# Patient Record
Sex: Female | Born: 1940 | Race: White | Hispanic: No | Marital: Married | Smoking: Never smoker
Health system: Southern US, Community
[De-identification: ages and names within clinical notes are randomized; demographics above are authoritative.]

## PROBLEM LIST (undated history)

## (undated) DIAGNOSIS — I8390 Asymptomatic varicose veins of unspecified lower extremity: Secondary | ICD-10-CM

## (undated) DIAGNOSIS — G709 Myoneural disorder, unspecified: Secondary | ICD-10-CM

## (undated) DIAGNOSIS — C50119 Malignant neoplasm of central portion of unspecified female breast: Secondary | ICD-10-CM

## (undated) DIAGNOSIS — M549 Dorsalgia, unspecified: Secondary | ICD-10-CM

## (undated) DIAGNOSIS — R259 Unspecified abnormal involuntary movements: Secondary | ICD-10-CM

## (undated) DIAGNOSIS — R42 Dizziness and giddiness: Secondary | ICD-10-CM

## (undated) DIAGNOSIS — C50919 Malignant neoplasm of unspecified site of unspecified female breast: Secondary | ICD-10-CM

## (undated) DIAGNOSIS — H814 Vertigo of central origin: Secondary | ICD-10-CM

## (undated) DIAGNOSIS — R030 Elevated blood-pressure reading, without diagnosis of hypertension: Secondary | ICD-10-CM

## (undated) DIAGNOSIS — R5383 Other fatigue: Secondary | ICD-10-CM

## (undated) DIAGNOSIS — F411 Generalized anxiety disorder: Secondary | ICD-10-CM

## (undated) DIAGNOSIS — D509 Iron deficiency anemia, unspecified: Secondary | ICD-10-CM

## (undated) DIAGNOSIS — B029 Zoster without complications: Secondary | ICD-10-CM

## (undated) DIAGNOSIS — Z9289 Personal history of other medical treatment: Secondary | ICD-10-CM

## (undated) DIAGNOSIS — J309 Allergic rhinitis, unspecified: Secondary | ICD-10-CM

## (undated) DIAGNOSIS — R5381 Other malaise: Secondary | ICD-10-CM

## (undated) DIAGNOSIS — K219 Gastro-esophageal reflux disease without esophagitis: Secondary | ICD-10-CM

## (undated) DIAGNOSIS — M79609 Pain in unspecified limb: Secondary | ICD-10-CM

## (undated) DIAGNOSIS — T7840XA Allergy, unspecified, initial encounter: Secondary | ICD-10-CM

## (undated) HISTORY — PX: TONSILLECTOMY: SUR1361

## (undated) HISTORY — PX: ECTOPIC PREGNANCY SURGERY: SHX613

## (undated) HISTORY — DX: Malignant neoplasm of central portion of unspecified female breast: C50.119

## (undated) HISTORY — DX: Dizziness and giddiness: R42

## (undated) HISTORY — DX: Other fatigue: R53.83

## (undated) HISTORY — DX: Asymptomatic varicose veins of unspecified lower extremity: I83.90

## (undated) HISTORY — DX: Malignant neoplasm of unspecified site of unspecified female breast: C50.919

## (undated) HISTORY — DX: Unspecified abnormal involuntary movements: R25.9

## (undated) HISTORY — DX: Myoneural disorder, unspecified: G70.9

## (undated) HISTORY — DX: Gastro-esophageal reflux disease without esophagitis: K21.9

## (undated) HISTORY — DX: Dorsalgia, unspecified: M54.9

## (undated) HISTORY — DX: Pain in unspecified limb: M79.609

## (undated) HISTORY — DX: Elevated blood-pressure reading, without diagnosis of hypertension: R03.0

## (undated) HISTORY — DX: Zoster without complications: B02.9

## (undated) HISTORY — DX: Allergic rhinitis, unspecified: J30.9

## (undated) HISTORY — DX: Other malaise: R53.81

## (undated) HISTORY — DX: Generalized anxiety disorder: F41.1

## (undated) HISTORY — DX: Vertigo of central origin: H81.4

## (undated) HISTORY — DX: Iron deficiency anemia, unspecified: D50.9

## (undated) HISTORY — PX: FOOT SURGERY: SHX648

## (undated) HISTORY — DX: Allergy, unspecified, initial encounter: T78.40XA

---

## 2007-04-21 ENCOUNTER — Emergency Department (HOSPITAL_COMMUNITY): Admission: EM | Admit: 2007-04-21 | Discharge: 2007-04-21 | Payer: Self-pay | Admitting: Emergency Medicine

## 2007-11-01 LAB — CONVERTED CEMR LAB: Pap Smear: NORMAL

## 2007-12-30 HISTORY — PX: OTHER SURGICAL HISTORY: SHX169

## 2008-01-24 ENCOUNTER — Ambulatory Visit: Payer: Self-pay | Admitting: Internal Medicine

## 2008-01-24 DIAGNOSIS — D509 Iron deficiency anemia, unspecified: Secondary | ICD-10-CM | POA: Insufficient documentation

## 2008-01-24 DIAGNOSIS — R259 Unspecified abnormal involuntary movements: Secondary | ICD-10-CM

## 2008-01-24 DIAGNOSIS — J309 Allergic rhinitis, unspecified: Secondary | ICD-10-CM | POA: Insufficient documentation

## 2008-01-24 DIAGNOSIS — K219 Gastro-esophageal reflux disease without esophagitis: Secondary | ICD-10-CM

## 2008-01-24 HISTORY — DX: Allergic rhinitis, unspecified: J30.9

## 2008-01-24 HISTORY — DX: Unspecified abnormal involuntary movements: R25.9

## 2008-01-24 HISTORY — DX: Gastro-esophageal reflux disease without esophagitis: K21.9

## 2008-01-24 HISTORY — DX: Iron deficiency anemia, unspecified: D50.9

## 2008-06-08 ENCOUNTER — Ambulatory Visit: Payer: Self-pay | Admitting: Internal Medicine

## 2008-06-08 LAB — CONVERTED CEMR LAB
ALT: 25 units/L (ref 0–35)
AST: 23 units/L (ref 0–37)
Albumin: 4.2 g/dL (ref 3.5–5.2)
BUN: 9 mg/dL (ref 6–23)
Basophils Absolute: 0 10*3/uL (ref 0.0–0.1)
Basophils Relative: 0.6 % (ref 0.0–1.0)
CO2: 30 meq/L (ref 19–32)
Calcium: 8.8 mg/dL (ref 8.4–10.5)
Chloride: 105 meq/L (ref 96–112)
Cholesterol: 195 mg/dL (ref 0–200)
Creatinine, Ser: 0.8 mg/dL (ref 0.4–1.2)
Eosinophils Absolute: 0.1 10*3/uL (ref 0.0–0.7)
Eosinophils Relative: 2.3 % (ref 0.0–5.0)
Hemoglobin: 13.6 g/dL (ref 12.0–15.0)
Ketones, ur: NEGATIVE mg/dL
LDL Cholesterol: 120 mg/dL — ABNORMAL HIGH (ref 0–99)
Lymphocytes Relative: 25.5 % (ref 12.0–46.0)
MCHC: 35 g/dL (ref 30.0–36.0)
MCV: 93.9 fL (ref 78.0–100.0)
Mucus, UA: NEGATIVE
Neutro Abs: 3.6 10*3/uL (ref 1.4–7.7)
Neutrophils Relative %: 60.8 % (ref 43.0–77.0)
RBC: 4.14 M/uL (ref 3.87–5.11)
Specific Gravity, Urine: 1.01 (ref 1.000–1.03)
TSH: 1.59 microintl units/mL (ref 0.35–5.50)
Total Protein, Urine: NEGATIVE mg/dL
Total Protein: 7 g/dL (ref 6.0–8.3)
Triglycerides: 65 mg/dL (ref 0–149)
Urine Glucose: NEGATIVE mg/dL
Urobilinogen, UA: 0.2 (ref 0.0–1.0)
WBC: 5.8 10*3/uL (ref 4.5–10.5)
pH: 7.5 (ref 5.0–8.0)

## 2008-06-22 ENCOUNTER — Encounter: Payer: Self-pay | Admitting: Internal Medicine

## 2008-08-16 ENCOUNTER — Ambulatory Visit: Payer: Self-pay | Admitting: Internal Medicine

## 2008-08-16 DIAGNOSIS — I8390 Asymptomatic varicose veins of unspecified lower extremity: Secondary | ICD-10-CM | POA: Insufficient documentation

## 2008-08-16 DIAGNOSIS — E785 Hyperlipidemia, unspecified: Secondary | ICD-10-CM | POA: Insufficient documentation

## 2008-08-16 HISTORY — DX: Asymptomatic varicose veins of unspecified lower extremity: I83.90

## 2008-08-17 ENCOUNTER — Telehealth (INDEPENDENT_AMBULATORY_CARE_PROVIDER_SITE_OTHER): Payer: Self-pay | Admitting: *Deleted

## 2008-08-28 ENCOUNTER — Ambulatory Visit: Payer: Self-pay | Admitting: Gastroenterology

## 2008-08-29 ENCOUNTER — Encounter: Payer: Self-pay | Admitting: Internal Medicine

## 2008-08-29 ENCOUNTER — Ambulatory Visit: Payer: Self-pay | Admitting: Family Medicine

## 2008-09-11 ENCOUNTER — Telehealth: Payer: Self-pay | Admitting: Internal Medicine

## 2008-09-12 ENCOUNTER — Telehealth: Payer: Self-pay | Admitting: Gastroenterology

## 2008-09-13 ENCOUNTER — Encounter: Payer: Self-pay | Admitting: Gastroenterology

## 2008-09-13 ENCOUNTER — Ambulatory Visit: Payer: Self-pay | Admitting: Gastroenterology

## 2008-09-15 ENCOUNTER — Encounter: Payer: Self-pay | Admitting: Gastroenterology

## 2008-12-29 HISTORY — PX: BACK SURGERY: SHX140

## 2009-01-22 ENCOUNTER — Telehealth (INDEPENDENT_AMBULATORY_CARE_PROVIDER_SITE_OTHER): Payer: Self-pay | Admitting: *Deleted

## 2009-02-09 ENCOUNTER — Telehealth: Payer: Self-pay | Admitting: Internal Medicine

## 2009-02-26 ENCOUNTER — Ambulatory Visit: Payer: Self-pay | Admitting: Internal Medicine

## 2009-02-26 DIAGNOSIS — R030 Elevated blood-pressure reading, without diagnosis of hypertension: Secondary | ICD-10-CM

## 2009-02-26 DIAGNOSIS — R5383 Other fatigue: Secondary | ICD-10-CM | POA: Insufficient documentation

## 2009-02-26 DIAGNOSIS — H814 Vertigo of central origin: Secondary | ICD-10-CM

## 2009-02-26 DIAGNOSIS — R5381 Other malaise: Secondary | ICD-10-CM

## 2009-02-26 DIAGNOSIS — F411 Generalized anxiety disorder: Secondary | ICD-10-CM

## 2009-02-26 HISTORY — DX: Other fatigue: R53.83

## 2009-02-26 HISTORY — DX: Vertigo of central origin: H81.4

## 2009-02-26 HISTORY — DX: Other malaise: R53.81

## 2009-02-26 HISTORY — DX: Elevated blood-pressure reading, without diagnosis of hypertension: R03.0

## 2009-02-26 HISTORY — DX: Generalized anxiety disorder: F41.1

## 2009-02-27 LAB — CONVERTED CEMR LAB
AST: 18 units/L (ref 0–37)
Albumin: 3.9 g/dL (ref 3.5–5.2)
BUN: 12 mg/dL (ref 6–23)
Basophils Relative: 1.1 % (ref 0.0–3.0)
Calcium: 9.2 mg/dL (ref 8.4–10.5)
Chloride: 105 meq/L (ref 96–112)
Creatinine, Ser: 0.9 mg/dL (ref 0.4–1.2)
Direct LDL: 107.4 mg/dL
Eosinophils Absolute: 0.1 10*3/uL (ref 0.0–0.7)
Eosinophils Relative: 2.2 % (ref 0.0–5.0)
GFR calc non Af Amer: 67 mL/min
HCT: 40.5 % (ref 36.0–46.0)
Hemoglobin: 13.6 g/dL (ref 12.0–15.0)
MCV: 95.1 fL (ref 78.0–100.0)
Monocytes Absolute: 0.6 10*3/uL (ref 0.1–1.0)
Neutro Abs: 2.9 10*3/uL (ref 1.4–7.7)
Neutrophils Relative %: 53.6 % (ref 43.0–77.0)
RBC: 4.26 M/uL (ref 3.87–5.11)
WBC: 5.5 10*3/uL (ref 4.5–10.5)

## 2009-06-08 ENCOUNTER — Encounter: Admission: RE | Admit: 2009-06-08 | Discharge: 2009-06-08 | Payer: Self-pay | Admitting: Orthopedic Surgery

## 2009-06-11 ENCOUNTER — Encounter: Admission: RE | Admit: 2009-06-11 | Discharge: 2009-06-11 | Payer: Self-pay | Admitting: Orthopedic Surgery

## 2009-06-22 ENCOUNTER — Ambulatory Visit: Payer: Self-pay | Admitting: Internal Medicine

## 2009-06-28 HISTORY — PX: OTHER SURGICAL HISTORY: SHX169

## 2009-07-18 ENCOUNTER — Inpatient Hospital Stay (HOSPITAL_COMMUNITY): Admission: RE | Admit: 2009-07-18 | Discharge: 2009-07-20 | Payer: Self-pay | Admitting: Orthopedic Surgery

## 2009-09-25 ENCOUNTER — Ambulatory Visit: Payer: Self-pay | Admitting: Internal Medicine

## 2010-01-21 ENCOUNTER — Encounter: Payer: Self-pay | Admitting: Internal Medicine

## 2010-02-28 ENCOUNTER — Ambulatory Visit: Payer: Self-pay | Admitting: Internal Medicine

## 2010-02-28 DIAGNOSIS — R42 Dizziness and giddiness: Secondary | ICD-10-CM

## 2010-02-28 HISTORY — DX: Dizziness and giddiness: R42

## 2010-04-02 ENCOUNTER — Ambulatory Visit: Payer: Self-pay | Admitting: Vascular Surgery

## 2010-04-10 ENCOUNTER — Telehealth: Payer: Self-pay | Admitting: Internal Medicine

## 2010-07-15 ENCOUNTER — Telehealth: Payer: Self-pay | Admitting: Internal Medicine

## 2010-09-23 ENCOUNTER — Encounter: Payer: Self-pay | Admitting: Internal Medicine

## 2010-09-30 ENCOUNTER — Ambulatory Visit: Payer: Self-pay | Admitting: Internal Medicine

## 2010-09-30 ENCOUNTER — Encounter: Payer: Self-pay | Admitting: Internal Medicine

## 2010-09-30 DIAGNOSIS — M79609 Pain in unspecified limb: Secondary | ICD-10-CM | POA: Insufficient documentation

## 2010-09-30 DIAGNOSIS — M549 Dorsalgia, unspecified: Secondary | ICD-10-CM | POA: Insufficient documentation

## 2010-09-30 HISTORY — DX: Pain in unspecified limb: M79.609

## 2010-09-30 HISTORY — DX: Dorsalgia, unspecified: M54.9

## 2010-10-01 LAB — CONVERTED CEMR LAB
AST: 23 units/L (ref 0–37)
Alkaline Phosphatase: 63 units/L (ref 39–117)
BUN: 17 mg/dL (ref 6–23)
Basophils Absolute: 0 10*3/uL (ref 0.0–0.1)
Calcium: 9.6 mg/dL (ref 8.4–10.5)
Direct LDL: 152.2 mg/dL
Folate: 9.7 ng/mL
GFR calc non Af Amer: 66.31 mL/min (ref >60)
Glucose, Bld: 102 mg/dL — ABNORMAL HIGH (ref 70–99)
HDL: 83.7 mg/dL (ref >39.00)
Iron: 106 ug/dL (ref 42–145)
Ketones, ur: NEGATIVE mg/dL
Lymphocytes Relative: 30.5 % (ref 12.0–46.0)
Monocytes Relative: 11.9 % (ref 3.0–12.0)
Platelets: 300 10*3/uL (ref 150.0–400.0)
RDW: 12.9 % (ref 11.5–14.6)
Sodium: 144 meq/L (ref 135–145)
Specific Gravity, Urine: 1.015 (ref 1.000–1.030)
Total Bilirubin: 0.6 mg/dL (ref 0.3–1.2)
Total CHOL/HDL Ratio: 3
Triglycerides: 62 mg/dL (ref 0.0–149.0)
Urine Glucose: NEGATIVE mg/dL
pH: 6.5 (ref 5.0–8.0)

## 2010-10-10 ENCOUNTER — Ambulatory Visit: Payer: Self-pay | Admitting: Internal Medicine

## 2010-10-10 LAB — CONVERTED CEMR LAB
Direct LDL: 118.3 mg/dL
Triglycerides: 59 mg/dL (ref 0.0–149.0)

## 2010-10-16 ENCOUNTER — Encounter (INDEPENDENT_AMBULATORY_CARE_PROVIDER_SITE_OTHER): Payer: Self-pay | Admitting: *Deleted

## 2010-10-24 ENCOUNTER — Encounter: Payer: Self-pay | Admitting: Internal Medicine

## 2010-10-29 ENCOUNTER — Telehealth (INDEPENDENT_AMBULATORY_CARE_PROVIDER_SITE_OTHER): Payer: Self-pay | Admitting: *Deleted

## 2010-11-05 ENCOUNTER — Telehealth: Payer: Self-pay | Admitting: Internal Medicine

## 2010-11-06 ENCOUNTER — Ambulatory Visit: Payer: Self-pay | Admitting: Internal Medicine

## 2010-11-06 DIAGNOSIS — B029 Zoster without complications: Secondary | ICD-10-CM

## 2010-11-06 HISTORY — DX: Zoster without complications: B02.9

## 2010-11-12 ENCOUNTER — Ambulatory Visit: Payer: Self-pay | Admitting: Internal Medicine

## 2010-11-12 ENCOUNTER — Encounter: Payer: Self-pay | Admitting: Internal Medicine

## 2010-12-10 ENCOUNTER — Encounter: Payer: Self-pay | Admitting: Internal Medicine

## 2010-12-11 ENCOUNTER — Encounter: Payer: Self-pay | Admitting: Internal Medicine

## 2010-12-26 ENCOUNTER — Encounter: Payer: Self-pay | Admitting: Internal Medicine

## 2011-01-28 NOTE — Progress Notes (Signed)
Summary: Med Refill  Phone Note From Pharmacy   [name] Summary of Call: Fax from pharmacy stating patient is requesting the remainder #60 tabs(Clonazepam 1mg ) in one dispensing due to vacation. Initial call taken by: Robin Ewing CMA Duncan Dull),  July 15, 2010 9:01 AM  Follow-up for Phone Call        ok this time Follow-up by: Corwin Levins MD,  July 15, 2010 12:27 PM  Additional Follow-up for Phone Call Additional follow up Details #1::        Left message on voicemail notifying pharmacy that per MD it is ok this time only. Additional Follow-up by: Lucious Groves CMA,  July 15, 2010 2:45 PM

## 2011-01-28 NOTE — Progress Notes (Signed)
Summary: SHINGLES  Phone Note Call from Patient   Caller: Patient [phone] Summary of Call: Pt called stating that she is in [place] and was Dx with Shingles yesterday on her forehead. Pt is being treated in [place] but she has a trip schedule to [place] on Monday 11/14 and would liketo know is she should be okay to go. Please advise. Initial call taken by: Margaret Pyle, CMA,  November 05, 2010 4:48 PM  Follow-up for Phone Call        should be ok to go, as long as she has no contact with person such as small children who have not had chicken pox, and as long as her pain is not too severe (although the pain can last longer than the rash) Follow-up by: Corwin Levins MD,  November 05, 2010 5:00 PM  Additional Follow-up for Phone Call Additional follow up Details #1::        left mess to call office back.....................Marland KitchenLamar Sprinkles, CMA  November 05, 2010 5:46 PM   Pt informed, she has further questions and will see dr Jonny Ruiz today Additional Follow-up by: Lamar Sprinkles, CMA,  November 06, 2010 10:35 AM

## 2011-01-28 NOTE — Letter (Signed)
Summary: [facility]  [facility]   Imported By: Sherian Rein 09/26/2010 08:30:37  _____________________________________________________________________  External Attachment:    Type:   Image     Comment:   External Document

## 2011-01-28 NOTE — Progress Notes (Signed)
  Phone Note Call from Patient   Caller: Patient Call For: Corwin Levins MD Summary of Call: Patient is scheduled to have foot surgery in December. But, her MD would like her to see the Neurologist before the surgery. Her Neurologist appt. is scheduled for Jan. 5 and foot surgery is middle of December. The patient will be out of town From Nov. 15 - Dec. 6. She would like a call back at [phone] to reschedule Neurology appt. if possible. Initial call taken by: Robin Ewing CMA Duncan Dull),  October 29, 2010 12:13 PM  Follow-up for Phone Call        Pt appt has been rescheduled to 11/06/10 @ 11am Dr Anne Hahn ([facility]), pt aware. Follow-up by: Dagoberto Reef,  October 31, 2010 4:51 PM

## 2011-01-28 NOTE — Assessment & Plan Note (Signed)
Summary: shingles-lb   Vital Signs:  Patient profile:   70 year old female Height:      63 inches Weight:      178 pounds BMI:     31.65 O2 Sat:      98 % on Room air Temp:     98.6 degrees F oral Pulse rate:   72 / minute BP sitting:   130 / 72  (left arm) Cuff size:   regular  Vitals Entered By: Zella Ball Ewing CMA (AAMA) (November 06, 2010 2:10 PM)  O2 Flow:  Room air CC: Shingles/RE   CC:  Shingles/RE.  History of Present Illness: here with 10 day onset pain and rash to right forehead and face , has some pain about the eye and more pain to chew on the right side; also pain to the peri-right ear and back of the head;  c/o marked fatigue but no other overt symptoms of ST, fever, cough, dysuria, abd pain, back pain and Pt denies CP, worsening sob, doe, wheezing, orthopnea, pnd, worsening LE edema, palps, dizziness or syncope  Pt denies new neuro symptoms such as headache, facial or extremity weakness No fever, wt loss, night sweats, loss of appetite or other constitutional symptoms   seen at urgent care in [place] with tx with valtrex, vicodin 5/500 (but not taking much as she is driving quite a bit);  leaving for [place] next tuesday Denies worsening depressive symptoms, suicidal ideation, or panic.    Problems Prior to Update: 1)  Back Pain  (ICD-724.5) 2)  Toe Pain  (ICD-729.5) 3)  Dizziness  (ICD-780.4) 4)  Preoperative Examination  (ICD-V72.84) 5)  Anxiety  (ICD-300.00) 6)  Elevated Blood Pressure Without Diagnosis of Hypertension  (ICD-796.2) 7)  Vertigo of Central Origin  (ICD-386.2) 8)  Fatigue  (ICD-780.79) 9)  Tremor  (ICD-781.0) 10)  Hyperlipidemia  (ICD-272.4) 11)  Abnormal Involuntary Movements  (ICD-781.0) 12)  Varicose Veins, Lower Extremities  (ICD-454.9) 13)  Preventive Health Care  (ICD-V70.0) 14)  Abnormal Involuntary Movements  (ICD-781.0) 15)  Gerd  (ICD-530.81) 16)  Allergic Rhinitis  (ICD-477.9) 17)  Anemia-iron Deficiency  (ICD-280.9)  Medications  Prior to Update: 1)  Clonazepam 1 Mg Tabs (Clonazepam) .Marland Kitchen.. 1 By Mouth At Bedtime As Needed 2)  Aspir-Low 81 Mg Tbec (Aspirin) .Marland Kitchen.. 1po Once Daily  Current Medications (verified): 1)  Clonazepam 1 Mg Tabs (Clonazepam) .Marland Kitchen.. 1 By Mouth At Bedtime As Needed 2)  Aspir-Low 81 Mg Tbec (Aspirin) .Marland Kitchen.. 1po Once Daily 3)  Valacyclovir Hcl 1 Gm Tabs (Valacyclovir Hcl) .Marland Kitchen.. 1 By Mouth Three Times A Day For 7 Days 4)  Hydrocodone-Acetaminophen 5-500 Mg Tabs (Hydrocodone-Acetaminophen) .Marland Kitchen.. 1 By Mouth Every 4 To 6 Hours As Needed 5)  Gabapentin 300 Mg Caps (Gabapentin) .Marland Kitchen.. 1-2 By Mouth Three Times A Day For Pain  Allergies (verified): 1)  ! * Novocaine  Past History:  Past Medical History: Last updated: 09/30/2010 Anemia-iron deficiency Allergic rhinitis GERD Hyperlipidemia Anxiety tremor - head and neck - has not seen neurology  MD roster:  ortho:  Dr gioffre                    GYN :  not yet  - pt to call                    Derm:  Dr Londell Moh                    optho:  cant remember                    GI:  Dr Jarold Motto  Past Surgical History: Last updated: 09/25/2009 Tonsillectomy tubal pregnancy at 70yo with salpingectomy s/p bilat breast implants 1/09 s/p lumbar surgury july 2010  Social History: Last updated: 09/30/2010 Never Smoked 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   Head:  normocephalic and atraumatic.   Eyes:  vision grossly intact, pupils equal, and pupils round.   Ears:  R ear normal and L ear normal.   Nose:  no external deformity and no nasal discharge.   Mouth:  no gingival abnormalities and pharynx pink and moist.   Neck:  supple and no masses.   Lungs:  normal respiratory effort and normal breath sounds.   Heart:  normal rate and  regular rhythm.   Extremities:  no edema, no erythema  Skin:  typical grouped vesicles to right forehead on erythem base with early crusting noted Psych:  slightly anxious.     Impression & Recommendations:  Problem # 1:  SHINGLES (ICD-053.9) dw pt - exam c/w healing shingles rash;  liekly no longer infectious; to finish the valtrex, cont the hdyrocodone as needed, and ok to start gabapentin now for herpetic pain for better control prior to upcoming trip out of the country; to consider shingles shot at later date  Complete Medication List: 1)  Clonazepam 1 Mg Tabs (Clonazepam) .Marland Kitchen.. 1 by mouth at bedtime as needed 2)  Aspir-low 81 Mg Tbec (Aspirin) .Marland Kitchen.. 1po once daily 3)  Valacyclovir Hcl 1 Gm Tabs (Valacyclovir hcl) .Marland Kitchen.. 1 by mouth three times a day for 7 days 4)  Hydrocodone-acetaminophen 5-500 Mg Tabs (Hydrocodone-acetaminophen) .Marland Kitchen.. 1 by mouth every 4 to 6 hours as needed 5)  Gabapentin 300 Mg Caps (Gabapentin) .Marland Kitchen.. 1-2 by mouth three times a day for pain  Patient Instructions: 1)  Please take all new medications as prescribed - the gabapentin is started as 300 mg at night for 2 nights, then two times a day for 2 days, then three times a day after that, with the option to go to 600 mg three times a day if needed for the pain (or any other combination such as 300 mg twice per day with 600 mg at bedtime depending on the pain level) 2)  Continue all previous medications as before this visit , including finishing the valtrex 3)  Please consider return for Nurse visit after you get back from [place] for the Shingles shot (remember medicare does not pay for this) 4)  Please schedule a follow-up appointment as needed. Prescriptions: GABAPENTIN 300 MG CAPS (GABAPENTIN) 1-2 by mouth three times a day for pain  #180 x 5   Entered and Authorized by:   Corwin Levins MD   Signed by:   Corwin Levins MD on 11/06/2010   Method used:   Print then Give to Patient   RxID:    [id number] HYDROCODONE-ACETAMINOPHEN 5-500 MG TABS (HYDROCODONE-ACETAMINOPHEN) 1 by mouth every 4 to 6 hours as needed  #60 x 1   Entered and Authorized by:   Corwin Levins MD   Signed by:   Corwin Levins MD on 11/06/2010   Method used:   Print then  Give to Patient   RxID:   [id number]    Orders Added: 1)  Est. Patient Level III [18841]

## 2011-01-28 NOTE — Letter (Signed)
Summary: [facility]  [facility]   Imported By: Lester Miller 02/20/2010 08:15:38  _____________________________________________________________________  External Attachment:    Type:   Image     Comment:   External Document

## 2011-01-28 NOTE — Letter (Signed)
Summary: Generic Letter  [facility]  [address] [place], [place] [address]   Phone: [phone]  Fax: [phone]    11/12/2010  [facility] [address] [place], [place]  [address]  Dear Ms. SIMS,      Please refund the amount of the ticket and trip   for November 2011 trip, as Ms Cecilio Asper was unable to start  the trip to acute medical illness.     Sincerely,   Oliver Barre MD

## 2011-01-28 NOTE — Assessment & Plan Note (Signed)
Summary: yearly f/u  / # / cd -   Vital Signs:  Patient profile:   70 year old female Height:      172 inches Weight:      63 pounds BMI:     1.50 O2 Sat:      97 % on Room air Temp:     97.5 degrees F oral Pulse rate:   55 / minute BP sitting:   128 / 88  (left arm) Cuff size:   regular  Vitals Entered By: Zella Ball Ewing CMA (AAMA) (September 30, 2010 10:21 AM)  O2 Flow:  Room air  CC: Yearly/RE/wellness   CC:  Yearly/RE/wellness.  History of Present Illness: here for wellness and f/u ;  finished with vein doctors;  head and neck tremor persists now worsening and would like referral;  Pt denies new neuro symptoms such as headache, facial or extremity weakness  Pt denies CP, worsening sob, doe, wheezing, orthopnea, pnd, worsening LE edema, palps, dizziness or syncope  Pt denies polydipsia, polyuria.  Overall good compliance with meds, trying to follow low chol diet, wt stable, little excercise however.  No fever, wt loss, night sweats, loss of appetite or other constitutional symptoms  Does also have right first MTP pain without swelling for which she saw ortho who reassured her it did not seem to be related to lower back issue, and suggested uric acid check.  Due for flu shot today.  Also with chronic LBP recurrent, mild worse today, without bowel or bladder changes, LE pain/weak/numb, gait change or fall. No fever or unusual wt loss  Here for wellness Diet: Heart Healthy or DM if diabetic Physical Activities: treadmill 30 min per day, and stat bike and light wts daily Depression/mood screen: Negative Hearing: Intact bilateral Visual Acuity: Grossly normal, gets exam yearly - due later this mo, wears contact lenses ADL's: Capable  Fall Risk: None Home Safety: Good Cognitive Impairment:  Gen appearance, affect, speech, memory, attention & motor skills grossly intact End-of-Life Planning: Advance directive - Full code/I agree   Preventive Screening-Counseling & Management      Drug  Use:  no.    Problems Prior to Update: 1)  Back Pain  (ICD-724.5) 2)  Toe Pain  (ICD-729.5) 3)  Dizziness  (ICD-780.4) 4)  Preoperative Examination  (ICD-V72.84) 5)  Anxiety  (ICD-300.00) 6)  Elevated Blood Pressure Without Diagnosis of Hypertension  (ICD-796.2) 7)  Vertigo of Central Origin  (ICD-386.2) 8)  Fatigue  (ICD-780.79) 9)  Tremor  (ICD-781.0) 10)  Hyperlipidemia  (ICD-272.4) 11)  Abnormal Involuntary Movements  (ICD-781.0) 12)  Varicose Veins, Lower Extremities  (ICD-454.9) 13)  Preventive Health Care  (ICD-V70.0) 14)  Abnormal Involuntary Movements  (ICD-781.0) 15)  Gerd  (ICD-530.81) 16)  Allergic Rhinitis  (ICD-477.9) 17)  Anemia-iron Deficiency  (ICD-280.9)  Medications Prior to Update: 1)  Clonazepam 1 Mg Tabs (Clonazepam) .Marland Kitchen.. 1 By Mouth At Bedtime As Needed 2)  Aspir-Low 81 Mg Tbec (Aspirin) .Marland Kitchen.. 1po Once Daily  Current Medications (verified): 1)  Clonazepam 1 Mg Tabs (Clonazepam) .Marland Kitchen.. 1 By Mouth At Bedtime As Needed 2)  Aspir-Low 81 Mg Tbec (Aspirin) .Marland Kitchen.. 1po Once Daily  Allergies (verified): 1)  ! 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 Tonsillectomy tubal pregnancy at 70yo with salpingectomy s/p bilat breast  implants 1/09 s/p lumbar surgury july 2010  Social History: Reviewed history from 08/16/2008 and no changes required. 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         all otherwise negative per pt -  except for mild ongoing fatigue, without OSA symtpoms or depression  Physical Exam  General:  alert and overweight-appearing.   Head:  normocephalic and atraumatic.   Eyes:  vision grossly intact, pupils equal, and pupils round.   Ears:  R ear normal and L ear normal.   Nose:  no external deformity and no nasal discharge.   Mouth:  no gingival abnormalities and pharynx pink and moist.   Neck:  supple and no masses.   Lungs:  normal respiratory effort and normal breath sounds.   Heart:  normal rate and regular rhythm.   Abdomen:  soft, non-tender, and normal bowel sounds.   Msk:  marked left torticollis noted, no joint tenderness and no joint swelling.  including the right first MTP Extremities:  no edema, no erythema  Neurologic:  as above , o/w - strength normal in all extremities, sensation intact to light touch, and gait normal.     Impression & Recommendations:  Problem # 1:  Preventive Health Care (ICD-V70.0) Overall doing well, age appropriate education and counseling updated and referral for appropriate preventive services done unless declined, immunizations up to date or declined, diet counseling done if overweight, urged to  quit smoking if smokes , most recent labs reviewed and current ordered if appropriate, ecg reviewed or declined (interpretation per ECG scanned in the EMR if done); information regarding Medicare Prevention requirements given if appropriate; speciality referrals updated as appropriate  Orders: EKG w/ Interpretation (93000) Medicare -1st Annual Wellness Visit [phone])  Problem # 2:  TOE PAIN (ICD-729.5)  check uric acid ; but still symptomatic  - will refer to Dr Lestine Box Gaylord Shih  Orders: Orthopedic Surgeon Referral (Ortho Surgeon) TLB-Uric Acid, Blood (84550-URIC)  Problem # 3:  ABNORMAL INVOLUNTARY MOVEMENTS (ICD-781.0) with head and neck tremor - ? worse  - for neuro consult Orders: Neurology Referral (Neuro)  Problem # 4:  BACK PAIN (ICD-724.5)  Her updated medication list for this problem includes:  Aspir-low 81 Mg Tbec (Aspirin) .Marland Kitchen... 1po once daily  Orders: TLB-Udip w/ Micro (81001-URINE) chronic recurrent - to check urine studies now  Problem # 5:  FATIGUE (ICD-780.79) mild - exam benign, to check labs below; follow with expectant management  Orders: TLB-BMP (Basic Metabolic Panel-BMET) (80048-METABOL) TLB-CBC Platelet - w/Differential (85025-CBCD) TLB-Hepatic/Liver Function Pnl (80076-HEPATIC) TLB-TSH (Thyroid Stimulating Hormone) (84443-TSH) TLB-Sedimentation Rate (ESR) (85652-ESR)  Complete Medication List: 1)  Clonazepam 1 Mg Tabs (Clonazepam) .Marland Kitchen.. 1 by mouth at bedtime as needed 2)  Aspir-low 81 Mg Tbec (Aspirin) .Marland Kitchen.. 1po once daily  Other Orders: Flu Vaccine 19yrs + MEDICARE PATIENTS (Z6109) Administration Flu vaccine - MCR (G0008) TLB-Lipid Panel (80061-LIPID) TLB-IBC Pnl (Iron/FE;Transferrin) (83550-IBC) TLB-B12 + Folate Pnl (82746_82607-B12/FOL)  Patient Instructions: 1)  your EKG was ok today 2)  You will be contacted about the referral(s) to: Ortho- Dr Lestine Box, and neurology 3)  please call for your yarly pap smear and mammogram 4)  Please go to the  Lab in the basement for your blood and/or urine tests today 5)  Please call the number on the [facility] Card for results of your testing 6)  Please schedule a follow-up appointment in 1 year or sooner if needed Prescriptions: CLONAZEPAM 1 MG TABS (CLONAZEPAM) 1 by mouth at bedtime as needed  #30 x 11   Entered and Authorized by:   Corwin Levins MD   Signed by:   Corwin Levins MD on 09/30/2010   Method used:   Print then Give to Patient   RxID:   [id number]      Flu Vaccine Consent Questions     Do you have a history of severe allergic reactions to this vaccine? no    Any prior history of allergic reactions to egg and/or gelatin? no    Do you have a sensitivity to the preservative Thimersol? no    Do you have a past history of Guillan-Barre Syndrome? no    Do you currently have an acute febrile illness? no    Have you ever had a severe reaction to latex? no    Vaccine information given and explained to patient? yes    Are you currently pregnant? no    Lot Number:[id number]   Exp Date:06/28/2011   Site Given  Left Deltoid IMu

## 2011-01-28 NOTE — Consult Note (Signed)
Summary: [facility]  [facility]   Imported By: Lennie Odor 10/29/2010 10:56:15  _____________________________________________________________________  External Attachment:    Type:   Image     Comment:   External Document

## 2011-01-28 NOTE — Letter (Signed)
Summary: [facility] Consult Scheduled Letter  [facility]  [address] [place], [place] [address]   Phone: [phone]  Fax: [phone]      10/16/2010 MRN: [id number]  [facility] [address] [place], [place]  [address]    Dear Ms. SIMS,      We have scheduled an appointment for you.  At the recommendation of Dr.John, we have scheduled you a consult with Dr Anne Hahn on 01/02/2011 at 9:30am.  Their phone number is [phone].  If this appointment day and time is not convenient for you, please feel free to call the office of the doctor you are being referred to at the number listed above and reschedule the appointment.    [facility] [address] [place], [place] [address]    Please arrive 30 minutes prior to appointment time.    Enclosure:Directions    Thank you,  Patient Care Coordinator [facility]

## 2011-01-28 NOTE — Progress Notes (Signed)
  Phone Note Call from Patient Call back at [phone]    Caller: Patient Call For: Corwin Levins MD Summary of Call: Pt requesting a refill of Clonacetam 1mg . [facility] [address] [phone] will not refill as it is too soon. Pt is going to [place] 4/18 for 3 wks and wants to be able to get this before she leaves,please advise. Initial call taken by: Verdell Face,  April 10, 2010 2:05 PM  Follow-up for Phone Call        ok to refill early this time Follow-up by: Corwin Levins MD,  April 10, 2010 2:46 PM  Additional Follow-up for Phone Call Additional follow up Details #1::        pharmacy informed Additional Follow-up by: Margaret Pyle, CMA,  April 10, 2010 4:15 PM

## 2011-01-28 NOTE — Assessment & Plan Note (Signed)
Summary: PER PT NOT BETTER--SHINGLES??---LETTER--STC   Vital Signs:  Patient profile:   70 year old female Height:      63.5 inches Weight:      171.13 pounds BMI:     29.95 O2 Sat:      95 % on Room air Temp:     98.2 degrees F oral Pulse rate:   58 / minute BP sitting:   140 / 80  (left arm) Cuff size:   regular  Vitals Entered By: Zella Ball Ewing CMA Duncan Dull) (November 12, 2010 3:32 PM)  O2 Flow:  Room air CC: Shingles, sore throat, cough, nasal congestion/RE   CC:  Shingles, sore throat, cough, and nasal congestion/RE.  History of Present Illness: here to f/u;  shingles pain actually improved and was planning to go on the trip out of town, but unofortutely also with acute onset 3 days fever, sT, and prod cough with greenish sputum;  gets very hot at night with chills , also with headache and left earache intermittently as well;  not sleeping well due to illness, cough cause flashes of worsening lower back pain, but o/w Pt denies CP, worsening sob, doe, wheezing, orthopnea, pnd, worsening LE edema, palps, dizziness or syncope Pt denies other new neuro symptoms such as headache, facial or extremity weakness , but was not able to keep her local neuro appt - had to cancel and could not be re-scheduled for > 6 wks; Pt denies polydipsia, polyuria.  Overall good compliance with meds, and good tolerability.   Problems Prior to Update: 1)  Bronchitis-acute  (ICD-466.0) 2)  Shingles  (ICD-053.9) 3)  Back Pain  (ICD-724.5) 4)  Toe Pain  (ICD-729.5) 5)  Dizziness  (ICD-780.4) 6)  Preoperative Examination  (ICD-V72.84) 7)  Anxiety  (ICD-300.00) 8)  Elevated Blood Pressure Without Diagnosis of Hypertension  (ICD-796.2) 9)  Vertigo of Central Origin  (ICD-386.2) 10)  Fatigue  (ICD-780.79) 11)  Tremor  (ICD-781.0) 12)  Hyperlipidemia  (ICD-272.4) 13)  Abnormal Involuntary Movements  (ICD-781.0) 14)  Varicose Veins, Lower Extremities  (ICD-454.9) 15)  Preventive Health Care  (ICD-V70.0) 16)   Abnormal Involuntary Movements  (ICD-781.0) 17)  Gerd  (ICD-530.81) 18)  Allergic Rhinitis  (ICD-477.9) 19)  Anemia-iron Deficiency  (ICD-280.9)  Medications Prior to Update: 1)  Clonazepam 1 Mg Tabs (Clonazepam) .Marland Kitchen.. 1 By Mouth At Bedtime As Needed 2)  Aspir-Low 81 Mg Tbec (Aspirin) .Marland Kitchen.. 1po Once Daily 3)  Valacyclovir Hcl 1 Gm Tabs (Valacyclovir Hcl) .Marland Kitchen.. 1 By Mouth Three Times A Day For 7 Days 4)  Hydrocodone-Acetaminophen 5-500 Mg Tabs (Hydrocodone-Acetaminophen) .Marland Kitchen.. 1 By Mouth Every 4 To 6 Hours As Needed 5)  Gabapentin 300 Mg Caps (Gabapentin) .Marland Kitchen.. 1-2 By Mouth Three Times A Day For Pain  Current Medications (verified): 1)  Clonazepam 1 Mg Tabs (Clonazepam) .Marland Kitchen.. 1 By Mouth At Bedtime As Needed 2)  Aspir-Low 81 Mg Tbec (Aspirin) .Marland Kitchen.. 1po Once Daily 3)  Valacyclovir Hcl 1 Gm Tabs (Valacyclovir Hcl) .Marland Kitchen.. 1 By Mouth Three Times A Day For 7 Days 4)  Hydrocodone-Acetaminophen 5-500 Mg Tabs (Hydrocodone-Acetaminophen) .Marland Kitchen.. 1 By Mouth Every 4 To 6 Hours As Needed 5)  Gabapentin 300 Mg Caps (Gabapentin) .Marland Kitchen.. 1-2 By Mouth Three Times A Day For Pain 6)  Levofloxacin 500 Mg Tabs (Levofloxacin) .Marland Kitchen.. 1po Once Daily 7)  Hydrocodone-Homatropine 5-1.5 Mg/77ml Syrp (Hydrocodone-Homatropine) .Marland Kitchen.. 1 Tsp By Mouth Q 6 Hrs As Needed Cough  Allergies (verified): 1)  ! * Novocaine  Past History:  Past Medical  History: Last updated: 09/30/2010 Anemia-iron deficiency Allergic rhinitis GERD Hyperlipidemia Anxiety tremor - head and neck - has not seen neurology  MD roster:  ortho:  Dr Darrelyn Hillock                    GYN :  not yet  - pt to call                    Derm:  Dr Londell Moh                    optho:   cant remember                    GI:  Dr Jarold Motto  Past Surgical History: Last updated: 09/25/2009 Tonsillectomy tubal pregnancy at 70yo with salpingectomy s/p bilat breast implants 1/09 s/p lumbar surgury july 2010  Social History: Last updated: 09/30/2010 Never Smoked 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  , mild ill  Head:  normocephalic and atraumatic.   Eyes:  vision grossly intact, pupils equal, and pupils round.   Ears:  bilat tm's mild erythema, sinus nontender Nose:  nasal dischargemucosal pallor and mucosal edema.   Mouth:  pharyngeal erythema and fair dentition.   Neck:  supple and cervical lymphadenopathy.   Lungs:  normal respiratory effort and normal breath sounds.   Heart:  normal rate and regular rhythm.   Msk:  marked left torticollis noted, no joint tenderness and no joint swelling.  Extremities:  no edema, no erythema  Neurologic:  as above , o/w - strength normal in all extremities, sensation intact to light touch, and gait normal., stil with significant head tremor   Impression & Recommendations:  Problem # 1:  BRONCHITIS-ACUTE (ICD-466.0)  Her updated medication list for this problem includes:    Levofloxacin 500 Mg Tabs (Levofloxacin) .Marland Kitchen... 1po once daily    Hydrocodone-homatropine 5-1.5 Mg/74ml Syrp (Hydrocodone-homatropine) .Marland Kitchen... 1 tsp by mouth q 6 hrs as needed cough treat as above, f/u any worsening signs or symptoms , cant r/o pna - for cxr as well - r/o pna  Orders: T-2 View CXR, Same Day (71020.5TC)  Problem # 2:  ABNORMAL INVOLUNTARY MOVEMENTS (ICD-781.0)  pt had to cancel local appt - wil try to refer to [facility]   Orders: Neurology Referral (Neuro)  Problem # 3:  SHINGLES (ICD-053.9) improved pain Continue all previous medications as before this visit   Problem # 4:  ELEVATED BLOOD PRESSURE WITHOUT DIAGNOSIS OF HYPERTENSION (ICD-796.2)  BP today: 140/80 Prior BP: 130/72 (11/06/2010)  Labs Reviewed: Creat: 0.9 (09/30/2010) Chol: 208 (10/10/2010)   HDL: 71.60  (10/10/2010)   LDL: DEL (02/27/2009)   TG: 59.0 (10/10/2010)  Instructed in low sodium diet (DASH Handout) and behavior modification.  treat as above,; mild elev today, likely situational, ok to follow, continue same treatment   Complete Medication List: 1)  Clonazepam 1 Mg Tabs (Clonazepam) .Marland Kitchen.. 1 by mouth at bedtime as needed 2)  Aspir-low 81 Mg Tbec (Aspirin) .Marland Kitchen.. 1po once daily 3)  Valacyclovir Hcl 1 Gm Tabs (Valacyclovir hcl) .Marland Kitchen.. 1 by mouth three times a day for 7 days 4)  Hydrocodone-acetaminophen 5-500 Mg Tabs (Hydrocodone-acetaminophen) .Marland Kitchen.. 1 by mouth every 4  to 6 hours as needed 5)  Gabapentin 300 Mg Caps (Gabapentin) .Marland Kitchen.. 1-2 by mouth three times a day for pain 6)  Levofloxacin 500 Mg Tabs (Levofloxacin) .Marland Kitchen.. 1po once daily 7)  Hydrocodone-homatropine 5-1.5 Mg/76ml Syrp (Hydrocodone-homatropine) .Marland Kitchen.. 1 tsp by mouth q 6 hrs as needed cough  Patient Instructions: 1)  Please take all new medications as prescribed 2)  Continue all previous medications as before this visit  3)  Please go to Radiology in the basement level for your X-Ray today  4)  Please call the number on the [facility] Card for results of your testing  5)  You are given the note for the trip excuse today 6)  You will be contacted about the referral(s) to: [facility], hopefully before dec 13 7)  Please schedule a follow-up appointment as needed. Prescriptions: HYDROCODONE-HOMATROPINE 5-1.5 MG/5ML SYRP (HYDROCODONE-HOMATROPINE) 1 tsp by mouth q 6 hrs as needed cough  #6oz x 1   Entered and Authorized by:   Corwin Levins MD   Signed by:   Corwin Levins MD on 11/12/2010   Method used:   Print then Give to Patient   RxID:   [id number] LEVOFLOXACIN 500 MG TABS (LEVOFLOXACIN) 1po once daily  #10 x 0   Entered and Authorized by:   Corwin Levins MD   Signed by:   Corwin Levins MD on 11/12/2010   Method used:   Print then Give to Patient   RxID:   [id number]    Orders Added: 1)   T-2 View CXR, Same Day [71020.5TC] 2)  Neurology Referral [Neuro] 3)  Est. Patient Level IV [84696]

## 2011-01-28 NOTE — Assessment & Plan Note (Signed)
Summary: 6 MO ROV/ TO COME FASTING/HAD REACTION TO MEDS AT VEIN CLINIC...   Vital Signs:  Patient profile:   70 year old female Height:      64 inches Weight:      162.75 pounds BMI:     28.04 O2 Sat:      96 % on Room air Temp:     97.7 degrees F oral Pulse rate:   59 / minute BP sitting:   118 / 84  (left arm) Cuff size:   regular  Vitals Entered ByZella Ball Ewing (February 28, 2010 10:10 AM)  O2 Flow:  Room air CC: 6 Mo ROV/RE   CC:  6 Mo ROV/RE.  History of Present Illness: here to f/u - unfortunately had an episode of decreased BP while undergoing vein tx to RLE, tx as allergy rxn with zyrtec, colucortef, epi, and benadryl;  episode resolved, no further symtpoms or apparent complications, and per pt was told her therapy was finished (rather abruptly per pt);  Pt denies CP, sob, doe, wheezing, orthopnea, pnd, worsening LE edema, palps, dizziness or syncope Pt denies new neuro symptoms such as headache, facial or extremity weakness     Problems Prior to Update: 1)  Dizziness  (ICD-780.4) 2)  Preoperative Examination  (ICD-V72.84) 3)  Anxiety  (ICD-300.00) 4)  Elevated Blood Pressure Without Diagnosis of Hypertension  (ICD-796.2) 5)  Vertigo of Central Origin  (ICD-386.2) 6)  Fatigue  (ICD-780.79) 7)  Tremor  (ICD-781.0) 8)  Hyperlipidemia  (ICD-272.4) 9)  Abnormal Involuntary Movements  (ICD-781.0) 10)  Varicose Veins, Lower Extremities  (ICD-454.9) 11)  Preventive Health Care  (ICD-V70.0) 12)  Abnormal Involuntary Movements  (ICD-781.0) 13)  Gerd  (ICD-530.81) 14)  Allergic Rhinitis  (ICD-477.9) 15)  Anemia-iron Deficiency  (ICD-280.9)  Medications Prior to Update: 1)  Clonazepam 1 Mg Tabs (Clonazepam) .Marland Kitchen.. 1 By Mouth At Bedtime As Needed 2)  Aspir-Low 81 Mg Tbec (Aspirin) .Marland Kitchen.. 1po Once Daily  Current Medications (verified): 1)  Clonazepam 1 Mg Tabs (Clonazepam) .Marland Kitchen.. 1 By Mouth At Bedtime As Needed 2)  Aspir-Low 81 Mg Tbec (Aspirin) .Marland Kitchen.. 1po Once Daily  Allergies  (verified): 1)  ! 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   Head:  normocephalic and atraumatic.   Eyes:  vision grossly intact, pupils equal, and pupils round.   Ears:  R ear normal and L ear normal.   Nose:  no external deformity and no nasal discharge.   Mouth:  no gingival abnormalities and pharynx pink and moist.   Neck:  supple and no masses.   Lungs:  normal respiratory effort and normal breath sounds.   Heart:  normal rate and regular rhythm.   Extremities:  no edema, no erythema  Neurologic:  alert & oriented X3, cranial nerves II-XII intact, and strength normal in all extremities.     Impression & Recommendations:  Problem # 1:  DIZZINESS (ICD-780.4) resolved, likely vagal vs allergic, d/w pt , educated, reassured, to cont all same  meds as per previous  Problem # 2:  VARICOSE VEINS, LOWER EXTREMITIES (ICD-454.9) still somewhat tender, decliines second opinion  Complete Medication List: 1)  Clonazepam 1 Mg Tabs (Clonazepam) .Marland Kitchen.. 1 by mouth at bedtime as needed 2)  Aspir-low 81 Mg Tbec (Aspirin) .Marland Kitchen.. 1po once daily  Patient Instructions: 1)  Continue all previous medications as before this visit  2)  Please see Vicie Mutters, Manager for communication issue 3)  Please schedule a follow-up appointment in 6 months for "yearly exam" Prescriptions: CLONAZEPAM 1 MG TABS (CLONAZEPAM) 1 by mouth  at bedtime as needed  #30 x 5   Entered and Authorized by:   Corwin Levins MD   Signed by:   Corwin Levins MD on 02/28/2010   Method used:   Print then Give to Patient   RxID:   [id number]

## 2011-01-30 NOTE — Letter (Signed)
Summary: [facility]   Imported By: Sherian Rein 12/16/2010 11:45:43  _____________________________________________________________________  External Attachment:    Type:   Image     Comment:   External Document

## 2011-01-30 NOTE — Letter (Signed)
Summary: Pre-Op Clearance/[facility]  Pre-Op Clearance/[facility]   Imported By: Sherian Rein 12/16/2010 08:57:45  _____________________________________________________________________  External Attachment:    Type:   Image     Comment:   External Document

## 2011-01-30 NOTE — Letter (Signed)
Summary: Physician Statement/[facility]  Physician Statement/[facility]   Imported By: Lester [place] 12/26/2010 10:52:57  _____________________________________________________________________  External Attachment:    Type:   Image     Comment:   External Document

## 2011-03-20 ENCOUNTER — Other Ambulatory Visit: Payer: Self-pay | Admitting: Internal Medicine

## 2011-03-20 DIAGNOSIS — N959 Unspecified menopausal and perimenopausal disorder: Secondary | ICD-10-CM

## 2011-03-25 ENCOUNTER — Inpatient Hospital Stay: Admission: RE | Admit: 2011-03-25 | Payer: Self-pay | Source: Ambulatory Visit

## 2011-04-06 LAB — TYPE AND SCREEN
ABO/RH(D): O POS
Antibody Screen: POSITIVE
Donor AG Type: NEGATIVE
PT AG Type: NEGATIVE

## 2011-04-06 LAB — HEMOGLOBIN AND HEMATOCRIT, BLOOD
HCT: 35.9 % — ABNORMAL LOW (ref 36.0–46.0)
HCT: 37.1 % (ref 36.0–46.0)
Hemoglobin: 12.2 g/dL (ref 12.0–15.0)
Hemoglobin: 12.3 g/dL (ref 12.0–15.0)

## 2011-04-06 LAB — URINE MICROSCOPIC-ADD ON

## 2011-04-06 LAB — CBC
Hemoglobin: 13.7 g/dL (ref 12.0–15.0)
MCHC: 33.7 g/dL (ref 30.0–36.0)
Platelets: 290 10*3/uL (ref 150–400)
RDW: 13.1 % (ref 11.5–15.5)

## 2011-04-06 LAB — APTT: aPTT: 29 seconds (ref 24–37)

## 2011-04-06 LAB — COMPREHENSIVE METABOLIC PANEL
ALT: 19 U/L (ref 0–35)
Albumin: 4 g/dL (ref 3.5–5.2)
Alkaline Phosphatase: 54 U/L (ref 39–117)
Calcium: 9.3 mg/dL (ref 8.4–10.5)
GFR calc Af Amer: >60 mL/min (ref >60)
Potassium: 4.8 mEq/L (ref 3.5–5.1)
Sodium: 143 mEq/L (ref 135–145)
Total Protein: 7.4 g/dL (ref 6.0–8.3)

## 2011-04-06 LAB — DIFFERENTIAL
Basophils Relative: 0 % (ref 0–1)
Eosinophils Absolute: 0.1 10*3/uL (ref 0.0–0.7)
Lymphs Abs: 1.7 10*3/uL (ref 0.7–4.0)
Monocytes Absolute: 0.7 10*3/uL (ref 0.1–1.0)
Monocytes Relative: 11 % (ref 3–12)

## 2011-04-06 LAB — URINALYSIS, ROUTINE W REFLEX MICROSCOPIC
Bilirubin Urine: NEGATIVE
Nitrite: NEGATIVE
Specific Gravity, Urine: 1.006 (ref 1.005–1.030)
pH: 6.5 (ref 5.0–8.0)

## 2011-04-21 ENCOUNTER — Other Ambulatory Visit: Payer: Self-pay

## 2011-04-21 MED ORDER — CLONAZEPAM 1 MG PO TABS: 1.0000 mg | ORAL_TABLET | Freq: Every evening | ORAL | Status: DC | PRN

## 2011-04-21 NOTE — Telephone Encounter (Signed)
Done hardcopy to dahlia/LIM B  

## 2011-04-21 NOTE — Telephone Encounter (Signed)
Pharmacy requesting refill on Clonazepam 1 mg last refill 09/30/10 #30 with 11 refills and last OV 11/12/10. Patient notes leaving the country 04/22/11 for 30 days and needs prescription refilled.

## 2011-04-21 NOTE — Telephone Encounter (Signed)
Sounds like she has refills already  [facility] to tell the pharmacy the next refill can be early this time only

## 2011-04-21 NOTE — Telephone Encounter (Signed)
Called the pharmacy and they stated the rx refills has ran out due to maximum refills allowed. Please advise.

## 2011-04-21 NOTE — Telephone Encounter (Signed)
Faxed hardcopy to pharmacy. 

## 2011-05-13 NOTE — Procedures (Signed)
LOWER EXTREMITY VENOUS REFLUX EXAM   INDICATION:   EXAM:  Using color-flow imaging and pulse Doppler spectral analysis, the  right common femoral, superficial femoral, popliteal, posterior tibial,  greater and lesser saphenous veins are evaluated.  There is no evidence  suggesting deep venous insufficiency in the right lower extremity.   The right saphenofemoral junction is competent. The right GSV is  competent.   The right proximal short saphenous vein demonstrates competency.   GSV Diameter (used if found to be incompetent only)                                            Right       Left  Proximal Greater Saphenous Vein            cm         cm  Proximal-to-mid-thigh                     cm          cm  Mid thigh                                  cm         cm  Mid-distal thigh                          cm          cm  Distal thigh                              cm          cm  Knee                                                  cm   IMPRESSION:  1. The right greater saphenous vein is competent.  2. The right greater saphenous vein is not aneurysmal.  3. The right greater saphenous vein is not tortuous.  4. The deep venous system is competent.  5. The right lesser saphenous vein is competent.   ___________________________________________  Quita Skye. Hart [place], M.D.   NT/MEDQ  D:  04/02/2010  T:  04/02/2010  Job:  [id number]

## 2011-05-13 NOTE — Consult Note (Signed)
NEW PATIENT CONSULTATION   [name] Bell, [name] Bell  DOB:  November 21, 1941                                       04/02/2010  YNWGN#:[id number]   The patient is a 70 year old female who has had venous treatment at  [facility] by Dr. Donia Ast.  She has previously had laser ablation of  her left great saphenous vein in the past and has recently had  sclerotherapy in the right calf area about 4 weeks ago.  Apparently  during the procedure according to the patient she became very blue, her  blood pressure dropped, she required injections of what she described as  cortisone and Benadryl and was eventually seen on one occasion in  followup by Dr. Donia Ast.  She had multiple questions for me regarding  this since I could not answer since I was not present.  She continues to  have burning discomfort in the right calf.  She has no specific symptoms  in the left leg.  She has no history of deep venous thrombosis,  thrombophlebitis, stasis ulcers, bleeding varicosities.  She is  continuing to wear her elastic compression stockings on occasion.  She  is able to ambulate at least one mile without discomfort but is  concerned about the burning discomfort which persists in the right calf.   CHRONIC MEDICAL PROBLEMS:  1. Degenerative disk disease in the back.  2. Previous lumbar surgery by Dr. Darrelyn Hillock in July of 2010.  3. Negative for diabetes, hypertension, coronary artery disease, COPD      or stroke.   FAMILY HISTORY:  Negative for coronary artery disease, diabetes and  stroke.   SOCIAL HISTORY:  She has two children, is not employed at the present  time.  She does not use tobacco.  Drinks occasional glass of wine.   REVIEW OF SYSTEMS:  Negative for chest pain, dyspnea on exertion, PND,  orthopnea.  No wheezing.  Denies any GI or GU symptoms.  She does have  lower extremity symptoms as described above.  No musculoskeletal or  neurologic symptoms.  All other systems in review of systems  are  negative.   PHYSICAL EXAMINATION:  Vital signs:  Blood pressure 146/83, heart rate  50, respirations 14.  General:  She is a well-developed, well-nourished  female who is in no apparent distress, alert and oriented x3.  HEENT:  Exam is normal.  EOMs intact.  Chest:  Clear to auscultation.  No  rhonchi or wheezing.  Cardiovascular:  Regular rhythm.  No murmurs.  Carotid pulses are 3+ with no audible bruits.  No distal edema is noted  in either lower extremity.  She has 3+ femoral, popliteal and dorsalis  pedis pulses bilaterally.  Abdomen:  Soft, nontender with no masses  palpable.  Musculoskeletal:  Exam is free of major deformities.  Neurological:  Normal.  Skin:  Free of rashes.  No bulging varicosities  are noted in either lower extremity and there are no spider or reticular  veins.  She has no distal edema and no tenderness.   Today I ordered a venous duplex exam of the right leg which I reviewed  and interpreted.  She has no evidence of deep venous obstruction.  She  has no reflux of deep system.  Her saphenous vein is patent from the  saphenofemoral junction to the knee with no reflux and is closed  from  the knee to the ankle.   I reassured her regarding these findings, that there is no venous  pathology present on her exam and that her symptoms may continue to  improve with time.  I could not speak to the reaction which she had  during her injection therapy but I have encouraged her not to have  further injections since there could be an allergic component to this  and if she has further questions regarding this she needs to contact  [facility] to find out specifically what was given.     Quita Skye Hart [place], M.D.  Electronically Signed   JDL/MEDQ  D:  04/02/2010  T:  04/03/2010  Job:  [id number]   cc:   Norval Gable. [place], M.D.

## 2011-05-13 NOTE — Op Note (Signed)
NAME:  SIMS, Maeli                  ACCOUNT NO.:  [id number]   MEDICAL RECORD NO.:  [id number]          PATIENT TYPE:  INP   LOCATION:  0002                         FACILITY:  [facility]   PHYSICIAN:  Georges Lynch. Gioffre, M.D.DATE OF BIRTH:  05-14-1941   DATE OF PROCEDURE:  07/18/2009  DATE OF DISCHARGE:                               OPERATIVE REPORT   SURGEON:  Dr. Darrelyn Hillock.   ASSISTANT:  Dr. Jene Every.   PREOPERATIVE DIAGNOSES:  1. 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  2. Spinal stenosis at L4-5.   POSTOPERATIVE DIAGNOSES:  1. 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  2. Spinal stenosis at L4-5.   OPERATION:  1. Gill procedure at L5-S1.  2. Central decompressive lumbar laminectomy at L4-5 for spinal      stenosis.  3. Bilateral lateral mass fusion from L4, L5, and S1; so, it will be      L4-S1 bilateral 2-level fusion utilizing the patient's lamina bone      as well as Actifuse as a supplement.   PROCEDURE:  Under general anesthesia, the patient on a spinal frame,  routine orthopedic prepping and draping of the back carried out, 2  needles were placed in the back for localization purposes, and an x-ray  was taken.  At this time, we identified the L4-L5, L5-S1 spaces.  We  then made an incision over the appropriate area, stripped the muscle  from the lamina and spinous process bilaterally.  Following that, we  identified the arch of L5 which was extremely loose as we would expect.  We gently dissected all the soft tissue off the arch, and another x-ray  was taken to verify the position.  Following that, we then did a Gill  procedure at L5-S1.  Following that, we brought the microscope in and  stripped out the remaining bone from the facet.  We did that bilaterally  until we identified the L5 and the S1 roots and made sure they were  clear.  We did foraminotomies for both roots bilaterally.  The  microscope was used.  We removed the ligamentum flavum over the area of  L4-5 and L5-S1 to decompress the dura.  Once we had a good exposure, we  thoroughly irrigated out the area.  We then stripped the muscle and soft  tissue structures off of the transverse processes of L4, L5 and the  sacrum.  We literally roughed up the sacrum with the curette  bilaterally.  Following that, we utilized the bone from the lamina that  we removed in the [place] procedure.  We also utilized Actifuse to  supplement the bone graft bilaterally.  We did a bilateral lateral mass  fusion at 2 levels at L4-L5, L5-S1 bilaterally.  Note, the dura was  protected with Gelfoam.  Following that, the wound was closed in layers  in the usual fashion.  I left the small deep proximal and distal part of  the wound open for drainage purposes.  Subcutaneous tissue was closed with 0 Vicryl, skin with metal staples,  and a sterile Neosporin dressing was applied.  The patient left the  operating room in satisfactory condition.  I had estimated the blood  loss to be about 125 mL.  She had 2 g of IV Ancef preoperatively.           ______________________________  Georges Lynch Darrelyn Hillock, M.D.     RAG/MEDQ  D:  07/18/2009  T:  07/18/2009  Job:  [id number]   cc:   Windy Fast A. Darrelyn Hillock, M.D.  Fax: [phone]   Corwin Levins, MD  [address]  [place]  [place] [address]

## 2011-05-16 NOTE — Discharge Summary (Signed)
NAME:  [name], [name]                  ACCOUNT NO.:  [id number]   MEDICAL RECORD NO.:  [id number]          PATIENT TYPE:  INP   LOCATION:  1621                         FACILITY:  [facility]   PHYSICIAN:  [name] [name]. [name] [name], M.D.DATE OF BIRTH:  03-22-1941   DATE OF ADMISSION:  07/18/2009  DATE OF DISCHARGE:  07/20/2009                               DISCHARGE SUMMARY   ADMITTING DIAGNOSES:  1. Anxiety.  2. Spondylolisthesis.   DISCHARGE DIAGNOSES:  1. Spondylolisthesis status post central decompressive lumbar      laminectomy at L4-5 as well as bilateral lateral mass fusion from      L4, L5 and S1.  2. Anxiety.   PROCEDURES:  1. A Gill procedure L5-S1.  2. Central decompressive lumbar laminectomy at L4-5 for spinal      stenosis.  3. Bilateral lateral mass fusion from L4, L5 and S1.   SURGEON:  Dr. Ranee [name].   ASSISTANT:  Dr. Jene [name].   ANESTHESIA:  General.   CONSULT:  None.   BRIEF HISTORY:  The patient is a 70 year old female with a history of  low back pain that radiates down both of her lower extremities.  She  also has a burning sensation in the plantar aspect of her right foot.  The patient has been followed by Dr. Darrelyn [name].  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 After  discussion with Dr. Darrelyn [name] the patient has opted for just back surgery  to correct her spinal stenosis and spondylolisthesis.   LABORATORY DATA:  The patient's pre-admission labs were completed on  July 17, 2009. A CBC revealed white count of 6.1, hemoglobin 13.7 and  hematocrit of 40.5, platelet count of 290. Coag studies reveal INR of  0.9. General chemistry is unremarkable. Preoperative urinalysis does  reveal moderate leukocytes but only 3-6 white blood cells are found.  Therefore, the patient was not started on antibiotics as her urine did  not reveal infection.  The patient's vital signs were monitored  throughout her stay in the  hospital the patient remained afebrile with  her highest temperature reaching 99.2.  The patient's blood pressure was  within normal limits with the highest reading being 130/73. No further  labs were drawn throughout her stay in the hospital.   HOSPITAL COURSE:  The patient was admitted to [facility] on July 18, 2009. She underwent the above-stated procedure without complications.  The patient tolerated the procedure well.  The patient had no difficulty  with general anesthesia.  The patient was then taken to recovery and  subsequently up to the orthopedic floor. On postoperative day #1 the  patient noted that she had no leg pain.  Her hemoglobin remained steady  at 12.2.  She was evaluated by physical therapy on postop day #1 and was  able to transfer from sitting to standing with minimal assistance as  well as from standing back to sitting with minimal assistance.  The  patient was also able to ambulate 60 feet with a rolling walker and  minimal assistance. On postoperative  day 2 physical therapy came to  visit the patient and she was able to ambulate 300 feet with a rolling  walker.  She was also able to ascend and descend 2 stairs going backward  with minimal assistance.  The patient seemed to have more pain on  postoperative day 2. This was well controlled with oral analgesics.  The  patient was discharged from the hospital on postoperative day 2 with  instructions to follow up in the office of Dr. Darrelyn [name] in 2 weeks from  the date of surgery.   DISPOSITION:  The patient was discharged to home on July 20, 2009.   DISCHARGE MEDICATIONS:  1. Diazepam 10 mg as needed for her anxiety.  2. Robaxin 500 mg 1 tablet t.i.d. p.r.n. muscle spasms.  3. Dilaudid 2 mg 1 tablet q.6 h p.r.n. pain.   FOLLOW UP:  The patient is to follow up in the office with Dr. Darrelyn [name] 2  weeks from the date of surgery. At that time we will remove staples and  provide follow-up care.   ACTIVITY:  The  patient is weightbearing as tolerated.  She is able to  walk around with a walker.  The patient has arranged for [name] [name] to come  out to the house, and they are to continue ADLs, physical therapy, and  occupational therapy with the patient.   CONDITION ON DISCHARGE:  Improving.      [name] [name], PAC    ______________________________  [name] [name] [name] [name], M.D.    LD/MEDQ  D:  08/11/2009  T:  08/12/2009  Job:  [id number]

## 2011-05-20 ENCOUNTER — Ambulatory Visit: Payer: Self-pay | Admitting: Internal Medicine

## 2011-05-27 ENCOUNTER — Encounter: Payer: Self-pay | Admitting: Internal Medicine

## 2011-05-27 DIAGNOSIS — Z0000 Encounter for general adult medical examination without abnormal findings: Secondary | ICD-10-CM | POA: Insufficient documentation

## 2011-05-28 ENCOUNTER — Encounter: Payer: Self-pay | Admitting: Internal Medicine

## 2011-05-28 ENCOUNTER — Ambulatory Visit (INDEPENDENT_AMBULATORY_CARE_PROVIDER_SITE_OTHER): Payer: Medicare Other | Admitting: Internal Medicine

## 2011-05-28 VITALS — BP 120/80 | HR 57 | Temp 97.6°F | Ht 64.0 in | Wt 177.1 lb

## 2011-05-28 DIAGNOSIS — F411 Generalized anxiety disorder: Secondary | ICD-10-CM

## 2011-05-28 DIAGNOSIS — L089 Local infection of the skin and subcutaneous tissue, unspecified: Secondary | ICD-10-CM

## 2011-05-28 DIAGNOSIS — R259 Unspecified abnormal involuntary movements: Secondary | ICD-10-CM

## 2011-05-28 MED ORDER — CLONAZEPAM 1 MG PO TABS: 1.0000 mg | ORAL_TABLET | Freq: Every evening | ORAL | Status: DC | PRN

## 2011-05-28 NOTE — Progress Notes (Signed)
  Subjective:    Patient ID: [name] Bell, female    DOB: 06-01-1941, 70 y.o.   MRN: [id number]  HPI Here to f/u;  Pt denies chest pain, increased sob or doe, wheezing, orthopnea, PND, increased LE swelling, palpitations, dizziness or syncope.  .Pt denies new neurological symptoms such as new headache, or facial or extremity weakness or numbness   Pt denies polydipsia, polyuria.  Denies worsening depressive symptoms, suicidal ideation, or panic, though has ongoing anxiety, not increased recently. Needs refill klonopin.  Still has not yet been able to attend the [name] Involuntary movement d/o clinic, as she has been overwhelmed with her left foot situation, now s/p surgury with a small wound persistent, but apparently recently mild cellulitis tx with antibx but pt asks if I think is really better.  Does have some redness and swelling to the dorsal foot, but no tender, red streaks, other swelling, fluctuance, drainage or fever, chills. Plans to go to [place] again in July this yr - just wants to be healed before she goes Past Medical History  Diagnosis Date  . 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 ALLERGIC RHINITIS 01/24/2008  . 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 ANXIETY 02/26/2009  . BACK PAIN 09/30/2010  . DIZZINESS 02/28/2010  . ELEVATED BLOOD PRESSURE WITHOUT DIAGNOSIS OF HYPERTENSION 02/26/2009  . FATIGUE 02/26/2009  . GERD 01/24/2008  . HYPERLIPIDEMIA 08/16/2008  . SHINGLES 11/06/2010  . TOE PAIN 09/30/2010  . VARICOSE VEINS, LOWER EXTREMITIES 08/16/2008  . 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 Tonsillectomy   . Ectopic pregnancy surgery     at 70 yo with salpingectomy  . S/p bilateral breasts implants 12/2007  . S/p lumbar surgury july 2010    reports that she has never smoked. She does not have any smokeless tobacco history on file. She reports that she drinks alcohol. She reports that she does not use illicit drugs. family history includes Cancer in  her father. No Known Allergies Current Outpatient Prescriptions on File Prior to Visit  Medication Sig Dispense Refill  . aspirin 81 MG tablet Take 81 mg by mouth daily.         Review of Systems All otherwise neg per pt     Objective:   Physical Exam BP 120/80  Pulse 57  Temp(Src) 97.6 F (36.4 C) (Oral)  Ht 5\' 4"  (1.626 m)  Wt 177 lb 2 oz (80.343 kg)  BMI 30.40 kg/m2  SpO2 96% Physical Exam  VS noted, not ill appearing,  Constitutional: Pt appears well-developed and well-nourished.  HENT: Head: Normocephalic.  Right Ear: External ear normal.  Left Ear: External ear normal.  Eyes: Conjunctivae and EOM are normal. Pupils are equal, round, and reactive to light.  Neck: Normal range of motion. Neck supple.  Cardiovascular: Normal rate and regular rhythm.   Pulmonary/Chest: Effort normal and breath sounds normal.  Abd:  Soft, NT, non-distended, + BS Neurological: Pt is alert. No cranial nerve deficit.  still marked head tremor noted Skin: Skin is warm. No erythema. except for minor erythema assoc with .5 cm superfic wound healing to dorsal left foot, without other signficant erythema, tender, drainage, red streaks Psychiatric: Pt behavior is normal. Thought content normal. 1-2+ nervous        Assessment & Plan:

## 2011-05-28 NOTE — Patient Instructions (Signed)
Continue all other medications as before Please return in 4 months, and we can consider the shingles shot at that time

## 2011-06-02 ENCOUNTER — Encounter: Payer: Self-pay | Admitting: Internal Medicine

## 2011-06-02 DIAGNOSIS — L089 Local infection of the skin and subcutaneous tissue, unspecified: Secondary | ICD-10-CM | POA: Insufficient documentation

## 2011-06-02 DIAGNOSIS — T148XXA Other injury of unspecified body region, initial encounter: Secondary | ICD-10-CM | POA: Insufficient documentation

## 2011-06-02 NOTE — Assessment & Plan Note (Signed)
Recent left foot cellulitis apparently resolved, only has typical wound healing appearance now, pt educated and reassured, no further eval or tx needed at this time

## 2011-06-02 NOTE — Assessment & Plan Note (Signed)
Still with signficant head and upper body tremorous motion; she plans to f/u with [facility] ,  to f/u any worsening symptoms or concerns

## 2011-06-02 NOTE — Assessment & Plan Note (Signed)
stable overall by hx and exam, most recent data reviewed with pt, and pt to continue medical treatment as before  Lab Results  Component Value Date   WBC 5.7 09/30/2010   HGB 14.2 09/30/2010   HCT 41.0 09/30/2010   PLT 300.0 09/30/2010   CHOL 208* 10/10/2010   TRIG 59.0 10/10/2010   HDL 71.60 10/10/2010   LDLDIRECT 118.3 10/10/2010   ALT 19 09/30/2010   AST 23 09/30/2010   NA 144 09/30/2010   K 4.4 10/10/2010   CL 107 09/30/2010   CREATININE 0.9 09/30/2010   BUN 17 09/30/2010   CO2 28 09/30/2010   TSH 1.72 09/30/2010   INR 0.9 07/17/2009   Med refilled today

## 2011-09-29 ENCOUNTER — Ambulatory Visit (INDEPENDENT_AMBULATORY_CARE_PROVIDER_SITE_OTHER): Payer: Medicare Other | Admitting: Internal Medicine

## 2011-09-29 ENCOUNTER — Other Ambulatory Visit (INDEPENDENT_AMBULATORY_CARE_PROVIDER_SITE_OTHER): Payer: Medicare Other

## 2011-09-29 ENCOUNTER — Encounter: Payer: Self-pay | Admitting: Internal Medicine

## 2011-09-29 VITALS — BP 116/70 | HR 57 | Temp 97.5°F | Ht 64.0 in | Wt 170.0 lb

## 2011-09-29 DIAGNOSIS — R5381 Other malaise: Secondary | ICD-10-CM

## 2011-09-29 DIAGNOSIS — R5383 Other fatigue: Secondary | ICD-10-CM

## 2011-09-29 DIAGNOSIS — E785 Hyperlipidemia, unspecified: Secondary | ICD-10-CM

## 2011-09-29 DIAGNOSIS — Z23 Encounter for immunization: Secondary | ICD-10-CM

## 2011-09-29 DIAGNOSIS — R609 Edema, unspecified: Secondary | ICD-10-CM | POA: Insufficient documentation

## 2011-09-29 DIAGNOSIS — M79609 Pain in unspecified limb: Secondary | ICD-10-CM

## 2011-09-29 DIAGNOSIS — F411 Generalized anxiety disorder: Secondary | ICD-10-CM

## 2011-09-29 DIAGNOSIS — M79671 Pain in right foot: Secondary | ICD-10-CM

## 2011-09-29 LAB — URINALYSIS, ROUTINE W REFLEX MICROSCOPIC
Bilirubin Urine: NEGATIVE
Hgb urine dipstick: NEGATIVE
Nitrite: NEGATIVE
Total Protein, Urine: NEGATIVE
Urine Glucose: NEGATIVE
Urobilinogen, UA: 0.2 (ref 0.0–1.0)

## 2011-09-29 LAB — BASIC METABOLIC PANEL
CO2: 26 mEq/L (ref 19–32)
Chloride: 106 mEq/L (ref 96–112)
Potassium: 4.2 mEq/L (ref 3.5–5.1)
Sodium: 141 mEq/L (ref 135–145)

## 2011-09-29 LAB — CBC WITH DIFFERENTIAL/PLATELET
Basophils Absolute: 0 10*3/uL (ref 0.0–0.1)
Basophils Relative: 0.4 % (ref 0.0–3.0)
Eosinophils Absolute: 0.1 10*3/uL (ref 0.0–0.7)
Lymphocytes Relative: 30.8 % (ref 12.0–46.0)
MCHC: 33 g/dL (ref 30.0–36.0)
MCV: 94.9 fl (ref 78.0–100.0)
Monocytes Absolute: 0.8 10*3/uL (ref 0.1–1.0)
Neutrophils Relative %: 56.2 % (ref 43.0–77.0)
Platelets: 330 10*3/uL (ref 150.0–400.0)
RBC: 4.35 Mil/uL (ref 3.87–5.11)
RDW: 12.8 % (ref 11.5–14.6)

## 2011-09-29 LAB — HEPATIC FUNCTION PANEL
ALT: 21 U/L (ref 0–35)
AST: 22 U/L (ref 0–37)
Alkaline Phosphatase: 59 U/L (ref 39–117)
Bilirubin, Direct: 0.1 mg/dL (ref 0.0–0.3)
Total Bilirubin: 0.4 mg/dL (ref 0.3–1.2)
Total Protein: 7.5 g/dL (ref 6.0–8.3)

## 2011-09-29 LAB — LIPID PANEL
Cholesterol: 197 mg/dL (ref 0–200)
HDL: 75.7 mg/dL (ref >39.00)
LDL Cholesterol: 110 mg/dL — ABNORMAL HIGH (ref 0–99)
VLDL: 11.8 mg/dL (ref 0.0–40.0)

## 2011-09-29 MED ORDER — CLONAZEPAM 1 MG PO TABS: 1.0000 mg | ORAL_TABLET | Freq: Every evening | ORAL | Status: DC | PRN

## 2011-09-29 MED ORDER — DULOXETINE HCL 30 MG PO CPEP: 30.0000 mg | ORAL_CAPSULE | Freq: Every day | ORAL | Status: DC

## 2011-09-29 MED ORDER — HYDROCHLOROTHIAZIDE 25 MG PO TABS: 25.0000 mg | ORAL_TABLET | Freq: Every day | ORAL | Status: DC

## 2011-09-29 MED ORDER — DULOXETINE HCL 60 MG PO CPEP: 60.0000 mg | ORAL_CAPSULE | Freq: Every day | ORAL | Status: DC

## 2011-09-29 NOTE — Assessment & Plan Note (Signed)
S/p surgury, told it would take up to a yr to improve, with persistent pain resulting in irritability, incr anxiety - will try cymbalta 30 x 1 wk, then 60 mg after that trial;, if not improved in 4 wks can stop after that

## 2011-09-29 NOTE — Progress Notes (Signed)
Subjective:    Patient ID: [name] Bell, female    DOB: 04-07-1941, 70 y.o.   MRN: [id number]  HPI  Here to f/u; Denies worsening depressive symptoms, suicidal ideation, or panic, though has ongoing anxiety, not increased recently, needs clonazepam refill.  S/p 2nd toe right foot ingrown nail, and nail  Removed permanently without complication.  Pt denies chest pain, increased sob or doe, wheezing, orthopnea, PND, increased LE swelling, palpitations, dizziness or syncope.  Pt denies new neurological symptoms such as new headache, or facial or extremity weakness or numbness   Pt denies polydipsia, polyuria,  Pt states overall good compliance with meds, trying to follow lower cholesterol diet, wt overall stable but little exercise however.   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 Does have sense of ongoing fatigue, but denies signficant hypersomnolence.  Has had some leg swelling right leg only , none on the left - chronic but has been using the husbands hctz 25 mg twice per wk with good results.  Stil has not seen [facility] neuro clinic fo rhte head involuntary moveements due to dealing with the foot surgury and pain, just felt "I couldn't deal with it." Past Medical History  Diagnosis Date  . 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 ALLERGIC RHINITIS 01/24/2008  . 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 ANXIETY 02/26/2009  . BACK PAIN 09/30/2010  . DIZZINESS 02/28/2010  . ELEVATED BLOOD PRESSURE WITHOUT DIAGNOSIS OF HYPERTENSION 02/26/2009  . FATIGUE 02/26/2009  . GERD 01/24/2008  . HYPERLIPIDEMIA 08/16/2008  . SHINGLES 11/06/2010  . TOE PAIN 09/30/2010  . VARICOSE VEINS, LOWER EXTREMITIES 08/16/2008  . 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 Tonsillectomy   . Ectopic pregnancy surgery     at 70 yo with salpingectomy  . S/p bilateral breasts implants 12/2007  . S/p lumbar surgury july 2010    reports that she has never smoked. She  does not have any smokeless tobacco history on file. She reports that she drinks alcohol. She reports that she does not use illicit drugs. family history includes Cancer in her father. No Known Allergies Current Outpatient Prescriptions on File Prior to Visit  Medication Sig Dispense Refill  . aspirin 81 MG tablet Take 81 mg by mouth daily.        . clonazePAM (KLONOPIN) 1 MG tablet Take 1 tablet (1 mg total) by mouth at bedtime as needed for anxiety.  30 tablet  3   Review of Systems Review of Systems  Constitutional: Negative for diaphoresis and unexpected weight change.  HENT: Negative for drooling and tinnitus.   Eyes: Negative for photophobia and visual disturbance.  Respiratory: Negative for choking and stridor.   Gastrointestinal: Negative for vomiting and blood in stool.  Genitourinary: Negative for hematuria and decreased urine volume.  Musculoskeletal: Negative for gait problem.  Skin: Negative for color change and wound.  Neurological: Negative for tremors and numbness.  Psychiatric/Behavioral: Negative for decreased concentration. The patient is not hyperactive.      Objective:   Physical Exam BP 116/70  Pulse 57  Temp(Src) 97.5 F (36.4 C) (Oral)  Ht 5\' 4"  (1.626 m)  Wt 170 lb (77.111 kg)  BMI 29.18 kg/m2  SpO2 97% Physical Exam  VS noted, a bit frustrated and angry about her physical slowing after having the back and foot surguries with some persistent pain since then Constitutional: Pt appears well-developed and well-nourished.  HENT: Head: Normocephalic.  Right  Ear: External ear normal.  Left Ear: External ear normal.  Eyes: Conjunctivae and EOM are normal. Pupils are equal, round, and reactive to light.  Neck: Normal range of motion. Neck supple.  Cardiovascular: Normal rate and regular rhythm.   Pulmonary/Chest: Effort normal and breath sounds normal.  Abd:  Soft, NT, non-distended, + BS Neurological: Pt is alert. No cranial nerve deficit.  Skin: Skin is  warm. No erythema.  Psychiatric: Pt behavior is normal. Thought content normal.     Assessment & Plan:

## 2011-09-29 NOTE — Assessment & Plan Note (Signed)
stable overall by hx and exam, most recent data reviewed with pt, and pt to continue medical treatment as before  Lab Results  Component Value Date   WBC 5.7 09/30/2010   HGB 14.2 09/30/2010   HCT 41.0 09/30/2010   PLT 300.0 09/30/2010   CHOL 208* 10/10/2010   TRIG 59.0 10/10/2010   HDL 71.60 10/10/2010   LDLDIRECT 118.3 10/10/2010   ALT 19 09/30/2010   AST 23 09/30/2010   NA 144 09/30/2010   K 4.4 10/10/2010   CL 107 09/30/2010   CREATININE 0.9 09/30/2010   BUN 17 09/30/2010   CO2 28 09/30/2010   TSH 1.72 09/30/2010   INR 0.9 07/17/2009

## 2011-09-29 NOTE — Assessment & Plan Note (Signed)
C/w venous insuff RLE - ok for hctz prn - rx done to pharmacy

## 2011-09-29 NOTE — Patient Instructions (Addendum)
You had the shingles shot today, and the flu shot Please start the cymbalta 30 mg per day for 1 wk (prescription sent to your pharmacy), then 60 mg after that (a second prescription is given hardcopy today) Continue all other medications as before - you are given the clonazepam refill today Please go to LAB in the Basement for the blood and/or urine tests to be done today Please call the phone number [phone] (the [employer] System) for results of testing in 2-3 days;  When calling, simply dial the number, and when prompted enter the MRN number above (the Medical Record Number) and the # key, then the message should start. Please return in 6 months, or sooner if needed

## 2011-09-29 NOTE — Assessment & Plan Note (Signed)
stable overall by hx and exam, most recent data reviewed with pt, and pt to continue medical treatment as before  Lab Results  Component Value Date   LDLCALC 120* 06/08/2008

## 2011-09-29 NOTE — Assessment & Plan Note (Signed)
Etiology unclear, Exam otherwise benign, to check labs as documented, follow with expectant management  

## 2012-01-05 ENCOUNTER — Telehealth: Payer: Self-pay | Admitting: Internal Medicine

## 2012-01-05 NOTE — Telephone Encounter (Signed)
The pt called and is requesting to be seen today for flu symptoms.  She was refused the apt opening on Wednesday.  She states she is having flu symptoms.  Please advise if this can be double booked.    Thanks!

## 2012-01-05 NOTE — Telephone Encounter (Signed)
Ok for today

## 2012-01-06 ENCOUNTER — Encounter: Payer: Self-pay | Admitting: Internal Medicine

## 2012-01-06 ENCOUNTER — Ambulatory Visit (INDEPENDENT_AMBULATORY_CARE_PROVIDER_SITE_OTHER): Payer: Medicare Other | Admitting: Internal Medicine

## 2012-01-06 VITALS — BP 142/90 | HR 60 | Temp 98.0°F | Ht 64.0 in | Wt 179.0 lb

## 2012-01-06 DIAGNOSIS — F411 Generalized anxiety disorder: Secondary | ICD-10-CM | POA: Diagnosis not present

## 2012-01-06 DIAGNOSIS — B9789 Other viral agents as the cause of diseases classified elsewhere: Secondary | ICD-10-CM

## 2012-01-06 DIAGNOSIS — B349 Viral infection, unspecified: Secondary | ICD-10-CM | POA: Insufficient documentation

## 2012-01-06 MED ORDER — ONDANSETRON HCL 4 MG PO TABS: 4.0000 mg | ORAL_TABLET | Freq: Three times a day (TID) | ORAL | Status: AC | PRN

## 2012-01-06 MED ORDER — OSELTAMIVIR PHOSPHATE 75 MG PO CAPS: 75.0000 mg | ORAL_CAPSULE | Freq: Two times a day (BID) | ORAL | Status: AC

## 2012-01-06 MED ORDER — HYDROCODONE-HOMATROPINE 5-1.5 MG/5ML PO SYRP: 5.0000 mL | ORAL_SOLUTION | Freq: Four times a day (QID) | ORAL | Status: AC | PRN

## 2012-01-06 NOTE — Patient Instructions (Addendum)
Take all new medications as prescribed Continue all other medications as before Please drink more fluids, and you can hold off on the fluid pill for the next 5 days You can also take Mucinex (or it's generic off brand) for congestion, and Immodium for loose stool if needed

## 2012-01-07 ENCOUNTER — Ambulatory Visit: Payer: Medicare Other | Admitting: Internal Medicine

## 2012-01-11 ENCOUNTER — Encounter: Payer: Self-pay | Admitting: Internal Medicine

## 2012-01-11 NOTE — Progress Notes (Signed)
  Subjective:    Patient ID: [name] Bell, female    DOB: 27-Oct-1941, 71 y.o.   MRN: [id number]  HPI  Here with 3 days onset flu like symptoms with low grade temp, ST, cough, congestion, general weakness and malaise, myalgis, nausea without vomiting, no diarrhea but recurring crampy abd pains.  No chills,  Denies urinary symptoms such as dysuria, frequency, urgency,or hematuria.  Did have to stop the cymbalta after 3 days due to intolerance last visit.  Pt denies chest pain, increased sob or doe, wheezing, orthopnea, PND, increased LE swelling, palpitations, dizziness or syncope.  Pt denies new neurological symptoms such as new headache, or facial or extremity weakness or numbness  Denies worsening depressive symptoms, suicidal ideation, or panic. Past Medical History  Diagnosis Date  . 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 ALLERGIC RHINITIS 01/24/2008  . 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 ANXIETY 02/26/2009  . BACK PAIN 09/30/2010  . DIZZINESS 02/28/2010  . ELEVATED BLOOD PRESSURE WITHOUT DIAGNOSIS OF HYPERTENSION 02/26/2009  . FATIGUE 02/26/2009  . GERD 01/24/2008  . HYPERLIPIDEMIA 08/16/2008  . SHINGLES 11/06/2010  . TOE PAIN 09/30/2010  . VARICOSE VEINS, LOWER EXTREMITIES 08/16/2008  . 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 Tonsillectomy   . Ectopic pregnancy surgery     at 71 yo with salpingectomy  . S/p bilateral breasts implants 12/2007  . S/p lumbar surgury july 2010    reports that she has never smoked. She does not have any smokeless tobacco history on file. She reports that she drinks alcohol. She reports that she does not use illicit drugs. family history includes Cancer in her father. No Known Allergies Current Outpatient Prescriptions on File Prior to Visit  Medication Sig Dispense Refill  . aspirin 81 MG tablet Take 81 mg by mouth daily.        . clonazePAM (KLONOPIN) 1 MG tablet Take 1 tablet (1 mg total) by mouth at bedtime as needed for  anxiety.  30 tablet  5  . hydrochlorothiazide (HYDRODIURIL) 25 MG tablet Take 1 tablet (25 mg total) by mouth daily. As needed for swelling  90 tablet  3   Review of Systems Review of Systems  Constitutional: Negative for diaphoresis and unexpected weight change.  HENT: Negative for drooling and tinnitus.   Eyes: Negative for photophobia and visual disturbance.  Respiratory: Negative for choking and stridor.   Gastrointestinal: Negative for vomiting and blood in stool.  Genitourinary: Negative for hematuria and decreased urine volume.      Objective:   Physical Exam BP 142/90  Pulse 60  Temp(Src) 98 F (36.7 C) (Oral)  Ht 5\' 4"  (1.626 m)  Wt 179 lb (81.194 kg)  BMI 30.73 kg/m2  SpO2 97% Physical Exam  VS noted, mild ill Constitutional: Pt appears well-developed and well-nourished.  HENT: Head: Normocephalic.  Right Ear: External ear normal.  Left Ear: External ear normal.  Bilat tm's mild erythema.  Sinus nontender.  Pharynx mild erythema Eyes: Conjunctivae and EOM are normal. Pupils are equal, round, and reactive to light.  Neck: Normal range of motion. Neck supple.  Cardiovascular: Normal rate and regular rhythm.   Pulmonary/Chest: Effort normal and breath sounds normal.  Abd:  Soft, NT, non-distended, + BS Neurological: Pt is alert. No cranial nerve deficit.  Skin: Skin is warm. No erythema. No rash Psychiatric: Pt behavior is normal. Thought content normal. 1+ nervous    Assessment & Plan:

## 2012-01-11 NOTE — Assessment & Plan Note (Signed)
Mild to mod, for tamiflu course for clinical influenza,  to f/u any worsening symptoms or concerns

## 2012-01-11 NOTE — Assessment & Plan Note (Signed)
stable overall by hx and exam, most recent data reviewed with pt, and pt to continue medical treatment as before, delcines other med or counseling at this time

## 2012-03-09 DIAGNOSIS — L719 Rosacea, unspecified: Secondary | ICD-10-CM | POA: Diagnosis not present

## 2012-03-09 DIAGNOSIS — L578 Other skin changes due to chronic exposure to nonionizing radiation: Secondary | ICD-10-CM | POA: Diagnosis not present

## 2012-03-09 DIAGNOSIS — L219 Seborrheic dermatitis, unspecified: Secondary | ICD-10-CM | POA: Diagnosis not present

## 2012-03-09 DIAGNOSIS — L819 Disorder of pigmentation, unspecified: Secondary | ICD-10-CM | POA: Diagnosis not present

## 2012-03-09 DIAGNOSIS — L570 Actinic keratosis: Secondary | ICD-10-CM | POA: Diagnosis not present

## 2012-03-29 ENCOUNTER — Ambulatory Visit: Payer: BC Managed Care – PPO | Admitting: Internal Medicine

## 2012-04-16 ENCOUNTER — Ambulatory Visit (INDEPENDENT_AMBULATORY_CARE_PROVIDER_SITE_OTHER): Payer: Medicare Other | Admitting: Internal Medicine

## 2012-04-16 ENCOUNTER — Encounter: Payer: Self-pay | Admitting: Internal Medicine

## 2012-04-16 VITALS — BP 160/82 | HR 68 | Temp 98.1°F | Ht 65.0 in | Wt 173.8 lb

## 2012-04-16 DIAGNOSIS — R030 Elevated blood-pressure reading, without diagnosis of hypertension: Secondary | ICD-10-CM

## 2012-04-16 DIAGNOSIS — R609 Edema, unspecified: Secondary | ICD-10-CM

## 2012-04-16 DIAGNOSIS — E785 Hyperlipidemia, unspecified: Secondary | ICD-10-CM | POA: Diagnosis not present

## 2012-04-16 DIAGNOSIS — F411 Generalized anxiety disorder: Secondary | ICD-10-CM | POA: Diagnosis not present

## 2012-04-16 MED ORDER — CLONAZEPAM 1 MG PO TABS: 1.0000 mg | ORAL_TABLET | Freq: Every evening | ORAL | Status: DC | PRN

## 2012-04-16 NOTE — Progress Notes (Signed)
Subjective:    Patient ID: [name] Bell, female    DOB: 10/16/1941, 71 y.o.   MRN: [id number]  HPI  Here to f/u; overall doing ok,  Pt denies chest pain, increased sob or doe, wheezing, orthopnea, PND, increased LE swelling, palpitations, dizziness or syncope.  Pt denies new neurological symptoms such as new headache, or facial or extremity weakness or numbness   Pt denies polydipsia, polyuria, or low sugar symptoms such as weakness or confusion improved with po intake.  Pt states overall good compliance with meds, trying to follow lower cholesterol, diabetic diet, wt overall stable but little exercise however.  BP here has been better controlled, and at home < 140/90; does not want to consider change today  Chronic Right foot pain seems better with fluid pill and less swelling, last seen per Dr Lovie Chol approx 2 yrs who has not left the practice, she plans to f/u with new orthopedic soon but may wait until the fall after trips to [place].    More stress recently, sold house, moved, and getting married June 2013, going to [place] next wk for 3 wks, then back again late July.  Needs refills meds. Denies worsening depressive symptoms, suicidal ideation, or panic.   Past Medical History  Diagnosis Date  . 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 ALLERGIC RHINITIS 01/24/2008  . 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 ANXIETY 02/26/2009  . BACK PAIN 09/30/2010  . DIZZINESS 02/28/2010  . ELEVATED BLOOD PRESSURE WITHOUT DIAGNOSIS OF HYPERTENSION 02/26/2009  . FATIGUE 02/26/2009  . GERD 01/24/2008  . HYPERLIPIDEMIA 08/16/2008  . SHINGLES 11/06/2010  . TOE PAIN 09/30/2010  . VARICOSE VEINS, LOWER EXTREMITIES 08/16/2008  . 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 Tonsillectomy   . Ectopic pregnancy surgery     at 71 yo with salpingectomy  . S/p bilateral breasts implants 12/2007  . S/p lumbar surgury july 2010    reports that she has never smoked. She does not have any smokeless  tobacco history on file. She reports that she drinks alcohol. She reports that she does not use illicit drugs. family history includes Cancer in her father. No Known Allergies Current Outpatient Prescriptions on File Prior to Visit  Medication Sig Dispense Refill  . aspirin 81 MG tablet Take 81 mg by mouth daily.        . clonazePAM (KLONOPIN) 1 MG tablet Take 1 tablet (1 mg total) by mouth at bedtime as needed for anxiety.  90 tablet  1  . hydrochlorothiazide (HYDRODIURIL) 25 MG tablet Take 1 tablet (25 mg total) by mouth daily. As needed for swelling  90 tablet  3   Review of Systems Review of Systems  Constitutional: Negative for diaphoresis and unexpected weight change.  HENT: Negative for drooling and tinnitus.   Eyes: Negative for photophobia and visual disturbance.  Respiratory: Negative for choking and stridor.   Gastrointestinal: Negative for vomiting and blood in stool.  Genitourinary: Negative for hematuria and decreased urine volume.  Skin: Negative for color change and wound.  Neurological: Negative for worsening tremors and numbness. , though still with significant head tremor Psychiatric/Behavioral: Negative for decreased concentration. The patient is not hyperactive.      Objective:   Physical Exam BP 160/82  Pulse 68  Temp(Src) 98.1 F (36.7 C) (Oral)  Ht 5\' 5"  (1.651 m)  Wt 173 lb 12.8 oz (78.835 kg)  BMI 28.92 kg/m2  SpO2 97% Physical Exam  VS noted, not ill  appearing Constitutional: Pt appears well-developed and well-nourished.  HENT: Head: Normocephalic.  Right Ear: External ear normal.  Left Ear: External ear normal.  Eyes: Conjunctivae and EOM are normal. Pupils are equal, round, and reactive to light.  Neck: Normal range of motion. Neck supple.  Cardiovascular: Normal rate and regular rhythm.   Pulmonary/Chest: Effort normal and breath sounds normal.  Abd:  Soft, NT, non-distended, + BS Neurological: Pt is alert. Marked head tremor persists  Skin:  Skin is warm. No erythema. No LE edema Psychiatric: Pt behavior is normal. Thought content normal. 1+ nervous     Assessment & Plan:

## 2012-04-16 NOTE — Patient Instructions (Addendum)
Please remember to followup with your GYN for the yearly mammogram (or call yourself, such as [facility] on [address], or [facility] on [address]) You are given the refill today - clonazepam 90 day Continue all other medications as before Please have the pharmacy call with any refills you may need. Please consider calling now for September appt with Dr Victorino Dike at [facility] Please return in 6 months, at which time we will likely need your yearly blood work (no need to fast though)

## 2012-04-17 ENCOUNTER — Encounter: Payer: Self-pay | Admitting: Internal Medicine

## 2012-04-17 NOTE — Assessment & Plan Note (Signed)
BP Readings from Last 3 Encounters:  04/16/12 160/82  01/06/12 142/90  09/29/11 116/70   Worsened control overall on the HCTZ she takes for edema, I think should be on futther antiHTN med but she is adamant her BP is better at home, delcines further tx today

## 2012-04-17 NOTE — Assessment & Plan Note (Signed)
stable overall by hx and exam, most recent data reviewed with pt, and pt to continue medical treatment as before, delcines further tx, goal ldl < 100, for cont'd diet effort  Lab Results  Component Value Date   LDLCALC 110* 09/29/2011

## 2012-04-17 NOTE — Assessment & Plan Note (Signed)
stable overall by hx and exam, most recent data reviewed with pt, and pt to continue medical treatment as before Lab Results  Component Value Date   WBC 6.9 09/29/2011   HGB 13.6 09/29/2011   HCT 41.3 09/29/2011   PLT 330.0 09/29/2011   GLUCOSE 94 09/29/2011   CHOL 197 09/29/2011   TRIG 59.0 09/29/2011   HDL 75.70 09/29/2011   LDLDIRECT 118.3 10/10/2010   LDLCALC 110* 09/29/2011   ALT 21 09/29/2011   AST 22 09/29/2011   NA 141 09/29/2011   K 4.2 09/29/2011   CL 106 09/29/2011   CREATININE 0.9 09/29/2011   BUN 15 09/29/2011   CO2 26 09/29/2011   TSH 1.39 09/29/2011   INR 0.9 07/17/2009    

## 2012-04-17 NOTE — Assessment & Plan Note (Signed)
stable overall by hx and exam,, and pt to continue medical treatment as before, pt plans to be married soon

## 2012-05-28 DIAGNOSIS — L219 Seborrheic dermatitis, unspecified: Secondary | ICD-10-CM | POA: Diagnosis not present

## 2012-05-28 DIAGNOSIS — L578 Other skin changes due to chronic exposure to nonionizing radiation: Secondary | ICD-10-CM | POA: Diagnosis not present

## 2012-07-09 DIAGNOSIS — L600 Ingrowing nail: Secondary | ICD-10-CM | POA: Diagnosis not present

## 2012-09-10 DIAGNOSIS — L600 Ingrowing nail: Secondary | ICD-10-CM | POA: Diagnosis not present

## 2012-10-15 ENCOUNTER — Ambulatory Visit: Payer: Medicare Other | Admitting: Internal Medicine

## 2012-10-21 ENCOUNTER — Other Ambulatory Visit (INDEPENDENT_AMBULATORY_CARE_PROVIDER_SITE_OTHER): Payer: Medicare Other

## 2012-10-21 ENCOUNTER — Ambulatory Visit (INDEPENDENT_AMBULATORY_CARE_PROVIDER_SITE_OTHER): Payer: Medicare Other | Admitting: Internal Medicine

## 2012-10-21 ENCOUNTER — Encounter: Payer: Self-pay | Admitting: Internal Medicine

## 2012-10-21 VITALS — BP 142/90 | HR 57 | Temp 97.0°F | Ht 64.0 in | Wt 173.0 lb

## 2012-10-21 DIAGNOSIS — E785 Hyperlipidemia, unspecified: Secondary | ICD-10-CM

## 2012-10-21 DIAGNOSIS — R609 Edema, unspecified: Secondary | ICD-10-CM | POA: Diagnosis not present

## 2012-10-21 DIAGNOSIS — D509 Iron deficiency anemia, unspecified: Secondary | ICD-10-CM | POA: Diagnosis not present

## 2012-10-21 DIAGNOSIS — R259 Unspecified abnormal involuntary movements: Secondary | ICD-10-CM

## 2012-10-21 DIAGNOSIS — Z23 Encounter for immunization: Secondary | ICD-10-CM

## 2012-10-21 DIAGNOSIS — R030 Elevated blood-pressure reading, without diagnosis of hypertension: Secondary | ICD-10-CM

## 2012-10-21 LAB — LIPID PANEL
Cholesterol: 217 mg/dL — ABNORMAL HIGH (ref 0–200)
Total CHOL/HDL Ratio: 3

## 2012-10-21 LAB — URINALYSIS, ROUTINE W REFLEX MICROSCOPIC
Hgb urine dipstick: NEGATIVE
Nitrite: NEGATIVE
Urobilinogen, UA: 0.2 (ref 0.0–1.0)

## 2012-10-21 LAB — HEPATIC FUNCTION PANEL
ALT: 26 U/L (ref 0–35)
Alkaline Phosphatase: 56 U/L (ref 39–117)
Bilirubin, Direct: 0.1 mg/dL (ref 0.0–0.3)
Total Protein: 7.5 g/dL (ref 6.0–8.3)

## 2012-10-21 LAB — CBC WITH DIFFERENTIAL/PLATELET
Basophils Absolute: 0 10*3/uL (ref 0.0–0.1)
Eosinophils Absolute: 0.1 10*3/uL (ref 0.0–0.7)
Lymphocytes Relative: 30 % (ref 12.0–46.0)
MCHC: 33.5 g/dL (ref 30.0–36.0)
Neutrophils Relative %: 58.4 % (ref 43.0–77.0)
Platelets: 329 10*3/uL (ref 150.0–400.0)
RBC: 4.4 Mil/uL (ref 3.87–5.11)
RDW: 13.2 % (ref 11.5–14.6)

## 2012-10-21 LAB — IBC PANEL: Iron: 80 ug/dL (ref 42–145)

## 2012-10-21 LAB — BASIC METABOLIC PANEL
CO2: 26 mEq/L (ref 19–32)
Chloride: 106 mEq/L (ref 96–112)
Creatinine, Ser: 0.9 mg/dL (ref 0.4–1.2)

## 2012-10-21 MED ORDER — CLONAZEPAM 1 MG PO TABS: 1.0000 mg | ORAL_TABLET | Freq: Every evening | ORAL | Status: DC | PRN

## 2012-10-21 MED ORDER — HYDROCHLOROTHIAZIDE 25 MG PO TABS: 25.0000 mg | ORAL_TABLET | Freq: Every day | ORAL | Status: DC

## 2012-10-21 NOTE — Patient Instructions (Addendum)
You had the flu shot today Please remember to followup with your GYN for the yearly pap smear and/or mammogram You will be contacted regarding the referral for: neurology Your medications were refilled as you requested Continue all other medications as before Please have the pharmacy call with any other refills you may need Please go to LAB in the Basement for the blood and/or urine tests to be done today You will be contacted by phone if any changes need to be made immediately.  Otherwise, you will receive a letter about your results with an explanation. Please return in 6 months, or sooner if needed

## 2012-10-21 NOTE — Assessment & Plan Note (Signed)
stable overall by hx and exam, most recent data reviewed with pt, and pt to continue medical treatment as before Lab Results  Component Value Date   WBC 6.9 09/29/2011   HGB 13.6 09/29/2011   HCT 41.3 09/29/2011   PLT 330.0 09/29/2011   GLUCOSE 94 09/29/2011   CHOL 197 09/29/2011   TRIG 59.0 09/29/2011   HDL 75.70 09/29/2011   LDLDIRECT 118.3 10/10/2010   LDLCALC 110* 09/29/2011   ALT 21 09/29/2011   AST 22 09/29/2011   NA 141 09/29/2011   K 4.2 09/29/2011   CL 106 09/29/2011   CREATININE 0.9 09/29/2011   BUN 15 09/29/2011   CO2 26 09/29/2011   TSH 1.39 09/29/2011   INR 0.9 07/17/2009

## 2012-10-21 NOTE — Assessment & Plan Note (Signed)
stable overall by hx and exam, most recent data reviewed with pt, and pt to continue medical treatment as before Lab Results  Component Value Date   LDLCALC 110* 09/29/2011

## 2012-10-21 NOTE — Progress Notes (Signed)
Subjective:    Patient ID: [name] Bell, female    DOB: 1941/04/22, 71 y.o.   MRN: [id number]  HPI  Here to f/u; overall doing ok,  Pt denies chest pain, increased sob or doe, wheezing, orthopnea, PND, increased LE swelling, palpitations, dizziness or syncope.  Pt denies new neurological symptoms such as new headache, or facial or extremity weakness or numbness   Pt denies polydipsia, polyuria, or low sugar symptoms such as weakness or confusion improved with po intake.  Pt states overall good compliance with meds, trying to follow lower cholesterol diet, wt overall stable but little exercise, may have gained a few lbs but wearing heavy clothes today.  BP at home and [facility] [place], [place] is < 140/90.  For flu shot today  She is OK with neurology referral to see about options for her tremor/dystonia.  Still chronic right foot pain essentially unexplained, followed per Dr Darrelyn Hillock who was successful with her lumbar surgury.   Past Medical History  Diagnosis Date  . 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 ALLERGIC RHINITIS 01/24/2008  . 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 ANXIETY 02/26/2009  . BACK PAIN 09/30/2010  . DIZZINESS 02/28/2010  . ELEVATED BLOOD PRESSURE WITHOUT DIAGNOSIS OF HYPERTENSION 02/26/2009  . FATIGUE 02/26/2009  . GERD 01/24/2008  . HYPERLIPIDEMIA 08/16/2008  . SHINGLES 11/06/2010  . TOE PAIN 09/30/2010  . VARICOSE VEINS, LOWER EXTREMITIES 08/16/2008  . 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 Tonsillectomy   . Ectopic pregnancy surgery     at 71 yo with salpingectomy  . S/p bilateral breasts implants 12/2007  . S/p lumbar surgury july 2010    reports that she has never smoked. She does not have any smokeless tobacco history on file. She reports that she drinks alcohol. She reports that she does not use illicit drugs. family history includes Cancer in her father. No Known Allergies Current Outpatient Prescriptions on File Prior to Visit  Medication  Sig Dispense Refill  . aspirin 81 MG tablet Take 81 mg by mouth daily.        Marland Kitchen DISCONTD: clonazePAM (KLONOPIN) 1 MG tablet Take 1 tablet (1 mg total) by mouth at bedtime as needed for anxiety.  90 tablet  1  . DISCONTD: hydrochlorothiazide (HYDRODIURIL) 25 MG tablet Take 1 tablet (25 mg total) by mouth daily. As needed for swelling  90 tablet  3   Review of Systems  Constitutional: Negative for diaphoresis and unexpected weight change.  HENT: Negative for tinnitus.   Eyes: Negative for photophobia and visual disturbance.  Respiratory: Negative for choking and stridor.   Gastrointestinal: Negative for vomiting and blood in stool.  Genitourinary: Negative for hematuria and decreased urine volume.  Musculoskeletal: Negative for gait problem.  Skin: Negative for color change and wound.  Neurological: Negative for numbness.  Psychiatric/Behavioral: Negative for decreased concentration. The patient is not hyperactive.       Objective:   Physical Exam BP 142/90  Pulse 57  Temp 97 F (36.1 C) (Oral)  Ht 5\' 4"  (1.626 m)  Wt 173 lb (78.472 kg)  BMI 29.70 kg/m2  SpO2 96% Physical Exam  VS noted Constitutional: Pt appears well-developed and well-nourished.  HENT: Head: Normocephalic.  Right Ear: External ear normal.  Left Ear: External ear normal.  Eyes: Conjunctivae and EOM are normal. Pupils are equal, round, and reactive to light.  Neck: Normal range of motion. Neck supple.  Cardiovascular: Normal rate and regular rhythm.  Pulmonary/Chest: Effort normal and breath sounds normal.  Abd:  Soft, NT, non-distended, + BS Neurological: Pt is alert. Not confused , till with head tremor and dystonic neck movement Skin: Skin is warm. No erythema. No rash, no LE edema Psychiatric: Pt behavior is normal. Thought content normal.     Assessment & Plan:

## 2012-10-21 NOTE — Assessment & Plan Note (Signed)
For [facility] neurology referral, Continue all other medications as before

## 2012-10-21 NOTE — Assessment & Plan Note (Signed)
For f/u iron today,  to f/u any worsening symptoms or concerns

## 2012-10-21 NOTE — Assessment & Plan Note (Signed)
stable overall by hx and exam, most recent data reviewed with pt, and pt to continue medical treatment as before BP Readings from Last 3 Encounters:  10/21/12 142/90  04/16/12 160/82  01/06/12 142/90

## 2012-10-22 LAB — TSH: TSH: 1.39 u[IU]/mL (ref 0.35–5.50)

## 2012-10-25 ENCOUNTER — Encounter: Payer: Self-pay | Admitting: Internal Medicine

## 2012-10-29 DIAGNOSIS — H2510 Age-related nuclear cataract, unspecified eye: Secondary | ICD-10-CM | POA: Diagnosis not present

## 2012-11-04 ENCOUNTER — Encounter: Payer: Self-pay | Admitting: Neurology

## 2012-11-04 ENCOUNTER — Ambulatory Visit (INDEPENDENT_AMBULATORY_CARE_PROVIDER_SITE_OTHER): Payer: Medicare Other | Admitting: Neurology

## 2012-11-04 VITALS — BP 138/86 | HR 72 | Temp 97.9°F | Resp 16 | Wt 178.0 lb

## 2012-11-04 DIAGNOSIS — G243 Spasmodic torticollis: Secondary | ICD-10-CM
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
## 2012-11-04 NOTE — Patient Instructions (Addendum)
1.  We will try to get prior authorization for botox. 2.  Call me in a week and a half if you haven't heard from [place].

## 2012-11-10 ENCOUNTER — Telehealth: Payer: Self-pay | Admitting: Neurology

## 2012-11-10 NOTE — Telephone Encounter (Signed)
Pt calling and checking on status of botox injections, states she was told at last ofc visit that we would contact insurance to see if they would cover injections, has not heard anything. Was wanting injections before she goes out of town in Dec. Request call back

## 2012-11-11 NOTE — Telephone Encounter (Signed)
Pt notified of insurance approval, will order botox and call pt once it arrives to schedule appt.

## 2012-11-17 ENCOUNTER — Ambulatory Visit (INDEPENDENT_AMBULATORY_CARE_PROVIDER_SITE_OTHER): Payer: Medicare Other | Admitting: Neurology

## 2012-11-17 ENCOUNTER — Encounter: Payer: Self-pay | Admitting: Neurology

## 2012-11-17 VITALS — BP 130/84 | HR 60 | Temp 97.6°F | Resp 16 | Wt 178.0 lb

## 2012-11-17 DIAGNOSIS — G243 Spasmodic torticollis: Secondary | ICD-10-CM

## 2012-11-17 MED ORDER — ONABOTULINUMTOXINA 100 UNITS IJ SOLR
200.0000 [IU] | Freq: Once | INTRAMUSCULAR | Status: AC
  Administered 2012-11-17: 200 [IU] via INTRAMUSCULAR

## 2012-11-17 NOTE — Progress Notes (Signed)
[facility]   Procedure Note Botox  Attending: Dr. Lurena Joiner Jet Armbrust  Preoperative Diagnosis(es): Cervical Dystonia   Consent obtained from: The patient Benefits discussed included, but were not limited to decreased muscle tightness, increased joint range of motion, and decreased pain.  Risk discussed included, but were not limited pain and discomfort, bleeding, bruising, excessive weakness, venous thrombosis, muscle atrophy and dysphagia.  A copy of the patient medication guide was given to the patient which explains the blackbox warning.  Informed consent was obtained  Patients identity and treatment sites confirmed yes.  Details of Procedure: Skin was cleaned with alcohol.  A 30 gauge, 1/2 inch needle was introduced to the target muscle (except splenius capitus, posterior approach, where 27 inch, 1 1/2 gauge needle was used).  Prior to injection, the needle plunger was aspirated to make sure the needle was not within a blood vessel.  There was no blood retrieved on aspiration.    Following is a summary of the muscles injected  And the amount of Botulinum toxin used:   Dilution 0.9% preservative free saline mixed with 100 u Botox type A to make 10 U per 0.1cc  Injections  Location Left  Right Units Number of sites        Sternocleidomastoid 60 0 60 1  Splenius Capitus, posterior approach 0 40 40 1  Splenius Capitus, lateral approach 0 60 60 1  Levator Scapulae 0 40 40 1  Trapezius            TOTAL UNITS:   200    Agent: Botulinum Type A ( Onobotulinum Toxin type A ).  2 vials of Botox were used, each containing 100 units and freshly diluted with 1 mL of sterile, non-preserved saline   Total injected (Units): 200  Total wasted (Units): none wasted   Pt tolerated procedure well without complications.   Reinjection is anticipated in 3 months.

## 2012-12-08 ENCOUNTER — Ambulatory Visit (INDEPENDENT_AMBULATORY_CARE_PROVIDER_SITE_OTHER): Payer: Medicare Other | Admitting: Neurology

## 2012-12-08 ENCOUNTER — Encounter: Payer: Self-pay | Admitting: Neurology

## 2012-12-08 VITALS — BP 128/80 | HR 60 | Temp 97.3°F | Resp 18 | Wt 172.0 lb

## 2012-12-08 DIAGNOSIS — R1310 Dysphagia, unspecified: Secondary | ICD-10-CM

## 2012-12-08 DIAGNOSIS — G243 Spasmodic torticollis: Secondary | ICD-10-CM | POA: Diagnosis not present

## 2012-12-08 NOTE — Patient Instructions (Addendum)
We will call you with the exact time for your next Botox on Feb. 21, 2014.

## 2012-12-08 NOTE — Progress Notes (Signed)
[name] Bell was seen today in neurologic f/u regarding cervical dystonia.  The patient had Botox injections on 11/17/2012.  She is accompanied by her husband today who supplements the history.  She received 200 U of botox on 11/20.  The patient reports that she was happy with the efficacy of the Botox, but unfortunately she had rather significant dysphagia.  Her husband states that for the first week she will choke even on liquids.  The patient does state that it has gotten significantly better, although not quite normal yet.  She is taking clonazepam one time per day because that also seems to help her neck.  She states that it decreases the tightness.  She has noticed almost no tremor in her head since the Botox kicked in.   PREVIOUS MEDICATIONS: Clonazepam  ALLERGIES:  No Known Allergies  CURRENT MEDICATIONS:  Current Outpatient Prescriptions on File Prior to Visit  Medication Sig Dispense Refill  . aspirin 81 MG tablet Take 81 mg by mouth daily.        . clonazePAM (KLONOPIN) 1 MG tablet Take 1 tablet (1 mg total) by mouth at bedtime as needed for anxiety.  90 tablet  2  . hydrochlorothiazide (HYDRODIURIL) 25 MG tablet Take 1 tablet (25 mg total) by mouth daily. As needed for swelling  90 tablet  3    PAST MEDICAL HISTORY:   Past Medical History  Diagnosis Date  . 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 ALLERGIC RHINITIS 01/24/2008  . ANEMIA-IRON DEFICIENCY 01/24/2008    pt denied  . ANXIETY 02/26/2009  . BACK PAIN 09/30/2010  . DIZZINESS 02/28/2010  . ELEVATED BLOOD PRESSURE WITHOUT DIAGNOSIS OF HYPERTENSION 02/26/2009  . FATIGUE 02/26/2009  . GERD 01/24/2008  . HYPERLIPIDEMIA 08/16/2008  . SHINGLES 11/06/2010  . TOE PAIN 09/30/2010  . VARICOSE VEINS, LOWER EXTREMITIES 08/16/2008  . 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    PAST SURGICAL HISTORY:   Past Surgical History  Procedure Date  . Tonsillectomy   . Ectopic pregnancy surgery     at 71 yo with salpingectomy  . S/p bilateral  breasts implants 12/2007  . S/p lumbar surgury july 2010  . Foot surgery     bunion, hammer toe    SOCIAL HISTORY:   History   Social History  . Marital Status: Single    Spouse Name: N/A    Number of Children: 2  . Years of Education: N/A   Occupational History  . unemployed housewife    Social History Main Topics  . Smoking status: Never Smoker   . Smokeless tobacco: Never Used  . Alcohol Use: Yes     Comment: 1 glass wine/day  . Drug Use: No  . Sexually Active: Not on file   Other Topics Concern  . Not on file   Social History Narrative  . No narrative on file    FAMILY HISTORY:   Family Status  Relation Status Death Age  . Mother Deceased 03-17-2009   osteoporosis, enlarged heart  . Father Deceased     lung CA  . Brother Deceased     MI, asthma  . Brother Alive   . Sister Alive     2, healthy  . Child Alive     2, healthy    ROS:  A complete 10 system review of systems was obtained and was unremarkable apart from what is mentioned above.  PHYSICAL EXAMINATION:    VITALS:   Filed Vitals:   12/08/12 1041  BP: 128/80  Pulse: 60  Temp: 97.3 F (36.3 C)  Resp: 18  Weight: 172 lb (78.019 kg)    GEN:  Normal appears female in no acute distress.  Appears stated age. HEENT:  Normocephalic, atraumatic. The mucous membranes are moist. The superficial temporal arteries are without ropiness or tenderness. Cardiovascular: Regular rate and rhythm. Lungs: Clear to auscultation bilaterally. Neck/Heme: There are no carotid bruits noted bilaterally.  NEUROLOGICAL: Orientation:  The patient is alert and oriented x 3.  Fund of knowledge is appropriate.  Recent and remote memory intact.  Attention span and concentration normal.  Repeats and names without difficulty. Cranial nerves: There is good facial symmetry. The pupils are equal round and reactive to light bilaterally. Fundoscopic exam reveals clear disc margins bilaterally. Extraocular muscles are intact and  visual fields are full to confrontational testing. Speech is fluent and clear. Soft palate rises symmetrically and there is no tongue deviation. Hearing is intact to conversational tone. Tone: Tone is good in the upper and lower extremities Sensation: Sensation is intact to light touch and pinprick throughout (facial, trunk, extremities). Vibration is intact at the bilateral big toe. There is no extinction with double simultaneous stimulation. There is no sensory dermatomal level identified. Coordination:  The patient has no difficulty with RAM's or FNF bilaterally. Motor: Strength is 5/5 in the bilateral upper and lower extremities.  Shoulder shrug is equal and symmetric. There is no pronator drift.  There are no fasciculations noted. DTR's: Deep tendon reflexes are 2/4 at the bilateral biceps, triceps, brachioradialis, patella and left Achilles.  She did not want to tested at the right Achilles because of prior surgery and pain.    Plantar responses are downgoing bilaterally. Gait and Station: The patient is able to ambulate without difficulty. The patient is able to heel toe walk without any difficulty. The patient is able to ambulate in a tandem fashion. The patient is able to stand in the Romberg position. Abnormal movements: The patient's head is still turned to the right with side bending of the head to the left.  There is significant hypertrophy of the left sternocleidomastoid.  There is also hypertrophy of the L levator muscle.  However, there is almost no head titubation today and she is able to turn her head to the left, whereas she could not do that previously.   IMPRESSION/PLAN:  1. severe cervical dystonia.  This is creating some neck discomfort.    -Her last botox injections were November 17, 2012.  She was pleased with the efficacy, but unfortunately experienced severe dysphagia.  We talked about that today.  She would like to proceed with repeat injections.  I think that it is likely  that the levator scapulae caused the dysphagia, as opposed to the sternocleidomastoid or splenius, but it could have been a combination of these.  I am hesitant to lower the dose as I think that the sternocleidomastoid actually needs more, and likely so does the splenius. I talked to her about her options, and ultimately decided to hold off on doing the levator scapulae at all.  I talked to her about blending up food and drinking protein shakes and being very careful with eating until the dysphagia has resolved.  She will call with questions/concerns.

## 2013-02-09 ENCOUNTER — Telehealth: Payer: Self-pay | Admitting: Neurology

## 2013-02-09 NOTE — Telephone Encounter (Signed)
Spoke with pt, tried to get Botox appt r/s from 02/16/13 to 02/18/13. She says she was originally scheduled for 02/18/13 but had to move it to 02/16/13 d/t a scheduling conflict with her husband's schedule. She is unable to come 02/18/13. I advised pt I would make our nurse, Tiffany, aware of this. As of now, the appt is still on for 02/16/13, I told the pt we would call her back if we need to move it from this date. Sherri S.

## 2013-02-16 ENCOUNTER — Ambulatory Visit (INDEPENDENT_AMBULATORY_CARE_PROVIDER_SITE_OTHER): Payer: Medicare Other | Admitting: Neurology

## 2013-02-16 ENCOUNTER — Encounter: Payer: Self-pay | Admitting: Neurology

## 2013-02-16 VITALS — BP 124/76 | HR 64 | Temp 97.7°F | Resp 16 | Wt 179.0 lb

## 2013-02-16 DIAGNOSIS — G243 Spasmodic torticollis: Secondary | ICD-10-CM | POA: Diagnosis not present

## 2013-02-16 MED ORDER — ONABOTULINUMTOXINA 100 UNITS IJ SOLR
200.0000 [IU] | Freq: Once | INTRAMUSCULAR | Status: AC
  Administered 2013-02-16: 200 [IU] via INTRAMUSCULAR

## 2013-02-16 MED ORDER — CLONAZEPAM 1 MG PO TABS: 1.0000 mg | ORAL_TABLET | Freq: Two times a day (BID) | ORAL | Status: DC | PRN

## 2013-02-16 NOTE — Progress Notes (Signed)
[facility]   Procedure Note Botox  Attending: Dr. Lurena Joiner Francisca Harbuck  Preoperative Diagnosis(es): Cervical Dystonia   Consent obtained from: The patient  Benefits discussed included, but were not limited to decreased muscle tightness, increased joint range of motion, and decreased pain.  Risk discussed included, but were not limited pain and discomfort, bleeding, bruising, excessive weakness, venous thrombosis, muscle atrophy and dysphagia.  A copy of the patient medication guide was given to the patient which explains the blackbox warning.  Informed consent was obtained  PREVIOUS RESULTS:  Helpful with less neck pain, but initial significant dysphagia.  States that previous injections wore off 3-4 weeks ago, with an increase in the pain and head rotation.  Requests RF of klonopin.  Patients identity and treatment sites confirmed yes.     Details of Procedure: Skin was cleaned with alcohol.  A 30 gauge, 1/2 inch needle was introduced to the target muscle (except splenius capitus, posterior approach, where 27 inch, 1 1/2 gauge needle was used).  Prior to injection, the needle plunger was aspirated to make sure the needle was not within a blood vessel.  There was no blood retrieved on aspiration.    Following is a summary of the muscles injected  And the amount of Botulinum toxin used:   Dilution 0.9% preservative free saline mixed with 100 u Botox type A to make 10 U per 0.1cc  Injections  Location Left  Right Units Number of sites        Sternocleidomastoid 60 0 60 1  Splenius Capitus, posterior approach 0 60 60 1  Splenius Capitus, lateral approach 0 60 60 1  Levator Scapulae 0   0  Trapezius            TOTAL UNITS:   180    Agent: Botulinum Type A ( Onobotulinum Toxin type A ).  2 vials of Botox were used, each containing 100 units and freshly diluted with 1 mL of sterile, non-preserved saline   Total injected (Units): 180  Total wasted (Units): 20 units   Pt tolerated  procedure well without complications.   Reinjection is anticipated in 3 months. Reassessment is anticipated in 4 weeksh

## 2013-02-16 NOTE — Patient Instructions (Addendum)
We will see you back in 4 weeks for a follow up and on May 16th for your next Botox injection.

## 2013-03-03 ENCOUNTER — Telehealth: Payer: Self-pay | Admitting: Neurology

## 2013-03-03 NOTE — Telephone Encounter (Signed)
The patient states that the Botox is not working as well this time around and her head is leaning a bit to the left.  The patient also stated that she wasn't given anything to help with her swallowing issues.  Please call the patient at [phone].

## 2013-03-07 NOTE — Telephone Encounter (Signed)
Feels like head is pulling slightly more to the left.  The swallowing is a bit better now.  She has follow up with this Wednesday.

## 2013-03-08 NOTE — Telephone Encounter (Signed)
I will talk with her on Wednesday about this.

## 2013-03-09 ENCOUNTER — Encounter: Payer: Self-pay | Admitting: Neurology

## 2013-03-09 ENCOUNTER — Ambulatory Visit (INDEPENDENT_AMBULATORY_CARE_PROVIDER_SITE_OTHER): Payer: Medicare Other | Admitting: Neurology

## 2013-03-09 VITALS — BP 100/70 | HR 68 | Temp 97.7°F | Resp 18 | Wt 173.0 lb

## 2013-03-09 DIAGNOSIS — G243 Spasmodic torticollis: Secondary | ICD-10-CM | POA: Diagnosis not present

## 2013-03-09 NOTE — Addendum Note (Signed)
Addended by: Lelon Huh on: 03/09/2013 01:23 PM   Modules accepted: Orders

## 2013-03-09 NOTE — Patient Instructions (Addendum)
Please schedule your follow up for May 16th. We will be calling you for the therapy once we find a location nearer to you.

## 2013-03-09 NOTE — Progress Notes (Signed)
[name] Bell was seen today in neurologic f/u regarding cervical dystonia.  The patient had Botox injections in Feb, 2014 (her second series).  She is accompanied by her husband today who supplements the history.   Her husband has noted that the head is shaking less.  She had markedly less dysphagia, stating that she was just careful because of her experience with the first series of injections.  Last visit, I did not do the left levator scapulae, and the patient states that she would like me to do that one again, even though that muscle was likely responsible for some of the dysphagia, if not all of it.  She states that the pain seems to come from shoulder elevation and her head pulling to the left shoulder.  She understands that dysphagia is a risk if we add that back in, but states that she felt markedly better when we were injecting this muscle.   PREVIOUS MEDICATIONS: Clonazepam  ALLERGIES:  No Known Allergies  CURRENT MEDICATIONS:  Current Outpatient Prescriptions on File Prior to Visit  Medication Sig Dispense Refill  . aspirin 81 MG tablet Take 81 mg by mouth daily.        Marland Kitchen b complex vitamins tablet Take 1 tablet by mouth daily.      . cholecalciferol (VITAMIN D) 1000 UNITS tablet Take 1,000 Units by mouth daily.      . clonazePAM (KLONOPIN) 1 MG tablet Take 1 tablet (1 mg total) by mouth 2 (two) times daily as needed for anxiety.  180 tablet  0  . glucosamine-chondroitin 500-400 MG tablet Take 1 tablet by mouth 3 (three) times daily.      . hydrochlorothiazide (HYDRODIURIL) 25 MG tablet Take 1 tablet (25 mg total) by mouth daily. As needed for swelling  90 tablet  3  . Multiple Vitamin (MULTIVITAMIN) tablet Take 1 tablet by mouth daily.      . Probiotic Product (PROBIOTIC DAILY PO) Take by mouth.       No current facility-administered medications on file prior to visit.    PAST MEDICAL HISTORY:   Past Medical History  Diagnosis Date  . 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  ALLERGIC RHINITIS 01/24/2008  . ANEMIA-IRON DEFICIENCY 01/24/2008    pt denied  . ANXIETY 02/26/2009  . BACK PAIN 09/30/2010  . DIZZINESS 02/28/2010  . ELEVATED BLOOD PRESSURE WITHOUT DIAGNOSIS OF HYPERTENSION 02/26/2009  . FATIGUE 02/26/2009  . GERD 01/24/2008  . HYPERLIPIDEMIA 08/16/2008  . SHINGLES 11/06/2010  . TOE PAIN 09/30/2010  . VARICOSE VEINS, LOWER EXTREMITIES 08/16/2008  . 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    PAST SURGICAL HISTORY:   Past Surgical History  Procedure Laterality Date  . Tonsillectomy    . Ectopic pregnancy surgery      at 72 yo with salpingectomy  . S/p bilateral breasts implants  12/2007  . S/p lumbar surgury  july 2010  . Foot surgery      bunion, hammer toe    SOCIAL HISTORY:   History   Social History  . Marital Status: Single    Spouse Name: N/A    Number of Children: 2  . Years of Education: N/A   Occupational History  . unemployed housewife    Social History Main Topics  . Smoking status: Never Smoker   . Smokeless tobacco: Never Used  . Alcohol Use: Yes     Comment: 1 glass wine/day  . Drug Use: No  . Sexually Active: Not on file  Other Topics Concern  . Not on file   Social History Narrative  . No narrative on file    FAMILY HISTORY:   Family Status  Relation Status Death Age  . Mother Deceased 2009-03-09   osteoporosis, enlarged heart  . Father Deceased     lung CA  . Brother Deceased     MI, asthma  . Brother Alive   . Sister Alive     2, healthy  . Child Alive     2, healthy    ROS:  A complete 10 system review of systems was obtained and was unremarkable apart from what is mentioned above.  PHYSICAL EXAMINATION:    VITALS:   Filed Vitals:   03/09/13 1110  BP: 100/70  Pulse: 68  Temp: 97.7 F (36.5 C)  Resp: 18  Weight: 173 lb (78.472 kg)    GEN:  Normal appears female in no acute distress.  Appears stated age.   NEUROLOGICAL: Orientation:  The patient is alert and oriented x 3.  Fund of knowledge is  appropriate.  Recent and remote memory intact.  Attention span and concentration normal.  Repeats and names without difficulty. Cranial nerves: There is good facial symmetry. The pupils are equal round and reactive to light bilaterally. Fundoscopic exam reveals clear disc margins bilaterally. Extraocular muscles are intact and visual fields are full to confrontational testing. Speech is fluent and clear. Soft palate rises symmetrically and there is no tongue deviation. Hearing is intact to conversational tone. Tone: Tone is good in the upper and lower extremities Sensation: Sensation is intact to light touch throughout. Motor: Strength is 5/5 in the bilateral upper and lower extremities.  Shoulder shrug is equal and symmetric. There is no pronator drift.  There are no fasciculations noted. Gait and Station: The patient is able to ambulate without difficulty. Abnormal movements: The patient's head is still turned to the right but it is much, much better in that regard with side bending of the head to the left (not improved).  There is almost no head titubation today and she is able to turn her head to the left, whereas she could not do that previously.   IMPRESSION/PLAN:  1. severe cervical dystonia.  This is creating some neck discomfort.    -Her last botox injections were in February, and because of dysphagia with her previous Botox, I eliminated the levator scapulae muscle.  Unfortunately, it is clear that she needs this injected.  She understands the risk of dysphagia again, but she states it helped tremendously when we were injecting that.  Next visit, I will continue to inject the left sternocleidomastoid, right splenius capitis and we will try to add back in the left levator scapulae.  -I am going to give her a prescription for a soft cervical collar that she can wear until we do next Botox.  She is having pain because of the pulling of the neck.  -I am going to send her to physical therapy for  myofascial release.  -Next botox is anticipated in May.

## 2013-03-10 ENCOUNTER — Ambulatory Visit: Payer: Medicare Other | Admitting: Neurology

## 2013-03-18 DIAGNOSIS — M2040 Other hammer toe(s) (acquired), unspecified foot: Secondary | ICD-10-CM | POA: Diagnosis not present

## 2013-03-21 DIAGNOSIS — M62838 Other muscle spasm: Secondary | ICD-10-CM | POA: Diagnosis not present

## 2013-03-21 DIAGNOSIS — M542 Cervicalgia: Secondary | ICD-10-CM | POA: Diagnosis not present

## 2013-03-21 DIAGNOSIS — IMO0001 Reserved for inherently not codable concepts without codable children: Secondary | ICD-10-CM | POA: Diagnosis not present

## 2013-03-21 DIAGNOSIS — M436 Torticollis: Secondary | ICD-10-CM | POA: Diagnosis not present

## 2013-03-25 DIAGNOSIS — M62838 Other muscle spasm: Secondary | ICD-10-CM | POA: Diagnosis not present

## 2013-03-25 DIAGNOSIS — M436 Torticollis: Secondary | ICD-10-CM | POA: Diagnosis not present

## 2013-03-25 DIAGNOSIS — IMO0001 Reserved for inherently not codable concepts without codable children: Secondary | ICD-10-CM | POA: Diagnosis not present

## 2013-03-25 DIAGNOSIS — M542 Cervicalgia: Secondary | ICD-10-CM | POA: Diagnosis not present

## 2013-03-28 DIAGNOSIS — M542 Cervicalgia: Secondary | ICD-10-CM | POA: Diagnosis not present

## 2013-03-28 DIAGNOSIS — M62838 Other muscle spasm: Secondary | ICD-10-CM | POA: Diagnosis not present

## 2013-03-28 DIAGNOSIS — M79609 Pain in unspecified limb: Secondary | ICD-10-CM | POA: Diagnosis not present

## 2013-03-28 DIAGNOSIS — M436 Torticollis: Secondary | ICD-10-CM | POA: Diagnosis not present

## 2013-03-28 DIAGNOSIS — IMO0001 Reserved for inherently not codable concepts without codable children: Secondary | ICD-10-CM | POA: Diagnosis not present

## 2013-04-01 DIAGNOSIS — M79609 Pain in unspecified limb: Secondary | ICD-10-CM | POA: Diagnosis not present

## 2013-04-04 DIAGNOSIS — M436 Torticollis: Secondary | ICD-10-CM | POA: Diagnosis not present

## 2013-04-04 DIAGNOSIS — R259 Unspecified abnormal involuntary movements: Secondary | ICD-10-CM | POA: Diagnosis not present

## 2013-04-04 DIAGNOSIS — M62838 Other muscle spasm: Secondary | ICD-10-CM | POA: Diagnosis not present

## 2013-04-04 DIAGNOSIS — IMO0001 Reserved for inherently not codable concepts without codable children: Secondary | ICD-10-CM | POA: Diagnosis not present

## 2013-04-04 DIAGNOSIS — M79609 Pain in unspecified limb: Secondary | ICD-10-CM | POA: Diagnosis not present

## 2013-04-06 DIAGNOSIS — M79609 Pain in unspecified limb: Secondary | ICD-10-CM | POA: Diagnosis not present

## 2013-04-08 DIAGNOSIS — IMO0001 Reserved for inherently not codable concepts without codable children: Secondary | ICD-10-CM | POA: Diagnosis not present

## 2013-04-08 DIAGNOSIS — M79609 Pain in unspecified limb: Secondary | ICD-10-CM | POA: Diagnosis not present

## 2013-04-08 DIAGNOSIS — M62838 Other muscle spasm: Secondary | ICD-10-CM | POA: Diagnosis not present

## 2013-04-08 DIAGNOSIS — R259 Unspecified abnormal involuntary movements: Secondary | ICD-10-CM | POA: Diagnosis not present

## 2013-04-08 DIAGNOSIS — M436 Torticollis: Secondary | ICD-10-CM | POA: Diagnosis not present

## 2013-04-11 DIAGNOSIS — M79609 Pain in unspecified limb: Secondary | ICD-10-CM | POA: Diagnosis not present

## 2013-04-13 DIAGNOSIS — R259 Unspecified abnormal involuntary movements: Secondary | ICD-10-CM | POA: Diagnosis not present

## 2013-04-13 DIAGNOSIS — M79609 Pain in unspecified limb: Secondary | ICD-10-CM | POA: Diagnosis not present

## 2013-04-13 DIAGNOSIS — M62838 Other muscle spasm: Secondary | ICD-10-CM | POA: Diagnosis not present

## 2013-04-13 DIAGNOSIS — IMO0001 Reserved for inherently not codable concepts without codable children: Secondary | ICD-10-CM | POA: Diagnosis not present

## 2013-04-13 DIAGNOSIS — M436 Torticollis: Secondary | ICD-10-CM | POA: Diagnosis not present

## 2013-04-19 DIAGNOSIS — R259 Unspecified abnormal involuntary movements: Secondary | ICD-10-CM | POA: Diagnosis not present

## 2013-04-19 DIAGNOSIS — M436 Torticollis: Secondary | ICD-10-CM | POA: Diagnosis not present

## 2013-04-19 DIAGNOSIS — M62838 Other muscle spasm: Secondary | ICD-10-CM | POA: Diagnosis not present

## 2013-04-19 DIAGNOSIS — IMO0001 Reserved for inherently not codable concepts without codable children: Secondary | ICD-10-CM | POA: Diagnosis not present

## 2013-04-20 DIAGNOSIS — IMO0001 Reserved for inherently not codable concepts without codable children: Secondary | ICD-10-CM | POA: Diagnosis not present

## 2013-04-20 DIAGNOSIS — M436 Torticollis: Secondary | ICD-10-CM | POA: Diagnosis not present

## 2013-04-20 DIAGNOSIS — M62838 Other muscle spasm: Secondary | ICD-10-CM | POA: Diagnosis not present

## 2013-04-20 DIAGNOSIS — R259 Unspecified abnormal involuntary movements: Secondary | ICD-10-CM | POA: Diagnosis not present

## 2013-04-21 ENCOUNTER — Ambulatory Visit: Payer: Medicare Other | Admitting: Internal Medicine

## 2013-05-02 DIAGNOSIS — M436 Torticollis: Secondary | ICD-10-CM | POA: Diagnosis not present

## 2013-05-02 DIAGNOSIS — M62838 Other muscle spasm: Secondary | ICD-10-CM | POA: Diagnosis not present

## 2013-05-02 DIAGNOSIS — IMO0001 Reserved for inherently not codable concepts without codable children: Secondary | ICD-10-CM | POA: Diagnosis not present

## 2013-05-02 DIAGNOSIS — M542 Cervicalgia: Secondary | ICD-10-CM | POA: Diagnosis not present

## 2013-05-05 DIAGNOSIS — M62838 Other muscle spasm: Secondary | ICD-10-CM | POA: Diagnosis not present

## 2013-05-05 DIAGNOSIS — M542 Cervicalgia: Secondary | ICD-10-CM | POA: Diagnosis not present

## 2013-05-05 DIAGNOSIS — M436 Torticollis: Secondary | ICD-10-CM | POA: Diagnosis not present

## 2013-05-05 DIAGNOSIS — IMO0001 Reserved for inherently not codable concepts without codable children: Secondary | ICD-10-CM | POA: Diagnosis not present

## 2013-05-09 ENCOUNTER — Ambulatory Visit: Payer: Medicare Other | Admitting: Internal Medicine

## 2013-05-09 DIAGNOSIS — M2040 Other hammer toe(s) (acquired), unspecified foot: Secondary | ICD-10-CM | POA: Diagnosis not present

## 2013-05-09 DIAGNOSIS — M79609 Pain in unspecified limb: Secondary | ICD-10-CM | POA: Diagnosis not present

## 2013-05-11 ENCOUNTER — Ambulatory Visit: Payer: Medicare Other | Admitting: Internal Medicine

## 2013-05-11 DIAGNOSIS — IMO0001 Reserved for inherently not codable concepts without codable children: Secondary | ICD-10-CM | POA: Diagnosis not present

## 2013-05-11 DIAGNOSIS — M542 Cervicalgia: Secondary | ICD-10-CM | POA: Diagnosis not present

## 2013-05-11 DIAGNOSIS — M436 Torticollis: Secondary | ICD-10-CM | POA: Diagnosis not present

## 2013-05-11 DIAGNOSIS — M62838 Other muscle spasm: Secondary | ICD-10-CM | POA: Diagnosis not present

## 2013-05-13 ENCOUNTER — Encounter: Payer: Self-pay | Admitting: Neurology

## 2013-05-13 ENCOUNTER — Ambulatory Visit (INDEPENDENT_AMBULATORY_CARE_PROVIDER_SITE_OTHER): Payer: Medicare Other | Admitting: Neurology

## 2013-05-13 VITALS — BP 122/78 | HR 60 | Temp 97.4°F | Resp 12 | Ht 64.0 in | Wt 173.0 lb

## 2013-05-13 DIAGNOSIS — G243 Spasmodic torticollis: Secondary | ICD-10-CM

## 2013-05-13 MED ORDER — ONABOTULINUMTOXINA 100 UNITS IJ SOLR: 200.0000 [IU] | Freq: Once | INTRAMUSCULAR | Status: DC

## 2013-05-13 MED ORDER — ONABOTULINUMTOXINA 100 UNITS IJ SOLR
250.0000 [IU] | Freq: Once | INTRAMUSCULAR | Status: AC
  Administered 2013-05-13: 250 [IU] via INTRAMUSCULAR

## 2013-05-13 NOTE — Progress Notes (Signed)
[facility]   Procedure Note Botox  Attending: Dr. Lurena Joiner Aaliya Maultsby  Preoperative Diagnosis(es): Cervical Dystonia  Result History  Onset of effect: 10 days  Duration of Benefit: wore off about 1 week ago; finding therapy to be very helpful.  Needs new RX for PT. Adverse Effects: dysphagia in past, but pt very much wants to try the levator scapulae again because of how helpful it was.   Increased pain from eliminating this m last visit resulted in increase use of klonopin.   Consent obtained from: The patient  Benefits discussed included, but were not limited to decreased muscle tightness, increased joint range of motion, and decreased pain.  Risk discussed included, but were not limited pain and discomfort, bleeding, bruising, excessive weakness, venous thrombosis, muscle atrophy and dysphagia.  A copy of the patient medication guide was given to the patient which explains the blackbox warning.  Informed consent was obtained  Patients identity and treatment sites confirmed yes.     Details of Procedure: Skin was cleaned with alcohol.  A 30 gauge, 1/2 inch needle was introduced to the target muscle (except splenius capitus, posterior approach, where 27 inch, 1 1/2 gauge needle was used).  Prior to injection, the needle plunger was aspirated to make sure the needle was not within a blood vessel.  There was no blood retrieved on aspiration.    Following is a summary of the muscles injected  And the amount of Botulinum toxin used:   Dilution 0.9% preservative free saline mixed with 100 u Botox type A to make 10 U per 0.1cc  Injections  Location Left  Right Units Number of sites        Sternocleidomastoid 60 0 60 1  Splenius Capitus, posterior approach 0 60 60 1  Splenius Capitus, lateral approach 0 60 60 1  Levator Scapulae 30  30 0  Trapezius            TOTAL UNITS:   210    Agent: Botulinum Type A ( Onobotulinum Toxin type A ). 3 vials of Botox were used, 2  containing 100  units and freshly diluted with 1 mL of sterile, non-preserved saline and 1 vial of 50 units diluted in same manner.   Total injected (Units): 210  Total wasted (Units): 40 units   Pt tolerated procedure well without complications.   Reinjection is anticipated in 3 months. Reassessment is anticipated in 4 weeks.  RX given for PT.

## 2013-05-27 DIAGNOSIS — M436 Torticollis: Secondary | ICD-10-CM | POA: Diagnosis not present

## 2013-05-27 DIAGNOSIS — IMO0001 Reserved for inherently not codable concepts without codable children: Secondary | ICD-10-CM | POA: Diagnosis not present

## 2013-05-27 DIAGNOSIS — M542 Cervicalgia: Secondary | ICD-10-CM | POA: Diagnosis not present

## 2013-05-27 DIAGNOSIS — M62838 Other muscle spasm: Secondary | ICD-10-CM | POA: Diagnosis not present

## 2013-05-30 ENCOUNTER — Ambulatory Visit: Payer: Medicare Other | Admitting: Internal Medicine

## 2013-05-31 DIAGNOSIS — M7750 Other enthesopathy of unspecified foot: Secondary | ICD-10-CM | POA: Diagnosis not present

## 2013-06-13 ENCOUNTER — Other Ambulatory Visit: Payer: Self-pay

## 2013-06-13 ENCOUNTER — Ambulatory Visit (INDEPENDENT_AMBULATORY_CARE_PROVIDER_SITE_OTHER): Payer: Medicare Other | Admitting: Neurology

## 2013-06-13 ENCOUNTER — Encounter: Payer: Self-pay | Admitting: Neurology

## 2013-06-13 VITALS — BP 108/72 | HR 64 | Temp 97.5°F | Resp 16 | Wt 172.0 lb

## 2013-06-13 DIAGNOSIS — G243 Spasmodic torticollis: Secondary | ICD-10-CM

## 2013-06-13 MED ORDER — CLONAZEPAM 1 MG PO TABS: 1.0000 mg | ORAL_TABLET | Freq: Two times a day (BID) | ORAL | Status: DC | PRN

## 2013-06-13 NOTE — Progress Notes (Signed)
[name] Bell was seen today in neurologic f/u regarding cervical dystonia.   She is accompanied by her husband today who supplements the history.   Her husband has noted that the head is shaking less.  She still had some dysphagia, but stated that it was much better than after the first injections and the efficacy was much better than last injections, when we left about the levator scapulae muscle.  Nonetheless, she still had to eat soft foods or drink protein shakes.  She states that she is much better now and that the side effect of dysphagia has basically worn off.She understands that dysphagia is a risk, but she is adamant that she would like to continue with this muscle, knowing that we have had this consistent side effect of dysphagia.  She does request a refill of her clonazepam.  She is taking this twice per day.   PREVIOUS MEDICATIONS: Clonazepam  ALLERGIES:  No Known Allergies  CURRENT MEDICATIONS:  Current Outpatient Prescriptions on File Prior to Visit  Medication Sig Dispense Refill  . aspirin 81 MG tablet Take 81 mg by mouth daily.        Marland Kitchen b complex vitamins tablet Take 1 tablet by mouth daily.      . cholecalciferol (VITAMIN D) 1000 UNITS tablet Take 1,000 Units by mouth daily.      . clonazePAM (KLONOPIN) 1 MG tablet Take 1 tablet (1 mg total) by mouth 2 (two) times daily as needed for anxiety.  180 tablet  0  . glucosamine-chondroitin 500-400 MG tablet Take 1 tablet by mouth 3 (three) times daily.      . hydrochlorothiazide (HYDRODIURIL) 25 MG tablet Take 1 tablet (25 mg total) by mouth daily. As needed for swelling  90 tablet  3  . Multiple Vitamin (MULTIVITAMIN) tablet Take 1 tablet by mouth daily.      . Probiotic Product (PROBIOTIC DAILY PO) Take by mouth.       No current facility-administered medications on file prior to visit.    PAST MEDICAL HISTORY:   Past Medical History  Diagnosis Date  . 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 ALLERGIC RHINITIS  01/24/2008  . ANEMIA-IRON DEFICIENCY 01/24/2008    pt denied  . ANXIETY 02/26/2009  . BACK PAIN 09/30/2010  . DIZZINESS 02/28/2010  . ELEVATED BLOOD PRESSURE WITHOUT DIAGNOSIS OF HYPERTENSION 02/26/2009  . FATIGUE 02/26/2009  . GERD 01/24/2008  . HYPERLIPIDEMIA 08/16/2008  . SHINGLES 11/06/2010  . TOE PAIN 09/30/2010  . VARICOSE VEINS, LOWER EXTREMITIES 08/16/2008  . 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    PAST SURGICAL HISTORY:   Past Surgical History  Procedure Laterality Date  . Tonsillectomy    . Ectopic pregnancy surgery      at 72 yo with salpingectomy  . S/p bilateral breasts implants  12/2007  . S/p lumbar surgury  july 2010  . Foot surgery      bunion, hammer toe    SOCIAL HISTORY:   History   Social History  . Marital Status: Single    Spouse Name: N/A    Number of Children: 2  . Years of Education: N/A   Occupational History  . unemployed housewife    Social History Main Topics  . Smoking status: Never Smoker   . Smokeless tobacco: Never Used  . Alcohol Use: Yes     Comment: 1 glass wine/day  . Drug Use: No  . Sexually Active: Not on file   Other Topics Concern  . Not on  file   Social History Narrative  . No narrative on file    FAMILY HISTORY:   Family Status  Relation Status Death Age  . Mother Deceased 2009-03-17   osteoporosis, enlarged heart  . Father Deceased     lung CA  . Brother Deceased     MI, asthma  . Brother Alive   . Sister Alive     2, healthy  . Child Alive     2, healthy    ROS:  A complete 10 system review of systems was obtained and was unremarkable apart from what is mentioned above.  PHYSICAL EXAMINATION:    VITALS:   Filed Vitals:   06/13/13 0942  BP: 108/72  Pulse: 64  Temp: 97.5 F (36.4 C)  Resp: 16  Weight: 172 lb (78.019 kg)    GEN:  Normal appears female in no acute distress.  Appears stated age.   NEUROLOGICAL: Orientation:  The patient is alert and oriented x 3.  Fund of knowledge is appropriate.  Recent  and remote memory intact.  Attention span and concentration normal.  Repeats and names without difficulty. Cranial nerves: There is good facial symmetry. The pupils are equal round and reactive to light bilaterally. Fundoscopic exam reveals clear disc margins bilaterally. Extraocular muscles are intact and visual fields are full to confrontational testing. Speech is fluent and clear. Soft palate rises symmetrically and there is no tongue deviation. Hearing is intact to conversational tone. Tone: Tone is good in the upper and lower extremities Sensation: Sensation is intact to light touch throughout. Motor: Strength is 5/5 in the bilateral upper and lower extremities.  Shoulder shrug is equal and symmetric. There is no pronator drift.  There are no fasciculations noted. Gait and Station: The patient is able to ambulate without difficulty. Abnormal movements: The patient's head is still turned to the right but it is much, much better in that regard with markedly less side bending to the left than previous.  She is able to turn her head to the left, whereas she could not do that previously.   IMPRESSION/PLAN:  1. severe cervical dystonia.  This is creating some neck discomfort.    -Her last botox injections were very efficacious, but we continue to have some problems with dysphagia.  She understands the risk of dysphagia again, but she states it helped tremendously when we were injecting the levator scapulae.  Next visit, I will not increase the dosage to the levator scapulae, but may increase the dosage to the left sternocleidomastoid.  -Her clonazepam was refilled today.  -Next botox is anticipated in August..

## 2013-06-20 DIAGNOSIS — M62838 Other muscle spasm: Secondary | ICD-10-CM | POA: Diagnosis not present

## 2013-06-20 DIAGNOSIS — IMO0001 Reserved for inherently not codable concepts without codable children: Secondary | ICD-10-CM | POA: Diagnosis not present

## 2013-06-20 DIAGNOSIS — M542 Cervicalgia: Secondary | ICD-10-CM | POA: Diagnosis not present

## 2013-06-20 DIAGNOSIS — G243 Spasmodic torticollis: Secondary | ICD-10-CM | POA: Diagnosis not present

## 2013-06-23 DIAGNOSIS — M62838 Other muscle spasm: Secondary | ICD-10-CM | POA: Diagnosis not present

## 2013-06-23 DIAGNOSIS — IMO0001 Reserved for inherently not codable concepts without codable children: Secondary | ICD-10-CM | POA: Diagnosis not present

## 2013-06-23 DIAGNOSIS — M542 Cervicalgia: Secondary | ICD-10-CM | POA: Diagnosis not present

## 2013-06-23 DIAGNOSIS — G243 Spasmodic torticollis: Secondary | ICD-10-CM | POA: Diagnosis not present

## 2013-06-27 DIAGNOSIS — G243 Spasmodic torticollis: Secondary | ICD-10-CM | POA: Diagnosis not present

## 2013-06-27 DIAGNOSIS — M542 Cervicalgia: Secondary | ICD-10-CM | POA: Diagnosis not present

## 2013-06-27 DIAGNOSIS — IMO0001 Reserved for inherently not codable concepts without codable children: Secondary | ICD-10-CM | POA: Diagnosis not present

## 2013-06-27 DIAGNOSIS — M62838 Other muscle spasm: Secondary | ICD-10-CM | POA: Diagnosis not present

## 2013-06-30 DIAGNOSIS — G243 Spasmodic torticollis: Secondary | ICD-10-CM | POA: Diagnosis not present

## 2013-06-30 DIAGNOSIS — R293 Abnormal posture: Secondary | ICD-10-CM | POA: Diagnosis not present

## 2013-06-30 DIAGNOSIS — IMO0001 Reserved for inherently not codable concepts without codable children: Secondary | ICD-10-CM | POA: Diagnosis not present

## 2013-06-30 DIAGNOSIS — M542 Cervicalgia: Secondary | ICD-10-CM | POA: Diagnosis not present

## 2013-07-04 ENCOUNTER — Encounter: Payer: Self-pay | Admitting: Internal Medicine

## 2013-07-04 ENCOUNTER — Other Ambulatory Visit (INDEPENDENT_AMBULATORY_CARE_PROVIDER_SITE_OTHER): Payer: Medicare Other

## 2013-07-04 ENCOUNTER — Ambulatory Visit (INDEPENDENT_AMBULATORY_CARE_PROVIDER_SITE_OTHER): Payer: Medicare Other | Admitting: Internal Medicine

## 2013-07-04 VITALS — BP 140/84 | HR 53 | Temp 97.1°F | Ht 64.0 in | Wt 174.5 lb

## 2013-07-04 DIAGNOSIS — H6980 Other specified disorders of Eustachian tube, unspecified ear: Secondary | ICD-10-CM

## 2013-07-04 DIAGNOSIS — E785 Hyperlipidemia, unspecified: Secondary | ICD-10-CM

## 2013-07-04 DIAGNOSIS — R609 Edema, unspecified: Secondary | ICD-10-CM

## 2013-07-04 DIAGNOSIS — J309 Allergic rhinitis, unspecified: Secondary | ICD-10-CM | POA: Diagnosis not present

## 2013-07-04 DIAGNOSIS — H6991 Unspecified Eustachian tube disorder, right ear: Secondary | ICD-10-CM | POA: Insufficient documentation

## 2013-07-04 DIAGNOSIS — G243 Spasmodic torticollis: Secondary | ICD-10-CM

## 2013-07-04 DIAGNOSIS — H6981 Other specified disorders of Eustachian tube, right ear: Secondary | ICD-10-CM | POA: Insufficient documentation

## 2013-07-04 LAB — HEPATIC FUNCTION PANEL
ALT: 18 U/L (ref 0–35)
AST: 17 U/L (ref 0–37)
Bilirubin, Direct: 0.1 mg/dL (ref 0.0–0.3)
Total Bilirubin: 0.7 mg/dL (ref 0.3–1.2)
Total Protein: 7.1 g/dL (ref 6.0–8.3)

## 2013-07-04 LAB — LIPID PANEL
Cholesterol: 211 mg/dL — ABNORMAL HIGH (ref 0–200)
Total CHOL/HDL Ratio: 3

## 2013-07-04 LAB — BASIC METABOLIC PANEL
BUN: 13 mg/dL (ref 6–23)
CO2: 27 mEq/L (ref 19–32)
Chloride: 107 mEq/L (ref 96–112)
Creatinine, Ser: 0.8 mg/dL (ref 0.4–1.2)
Potassium: 4.3 mEq/L (ref 3.5–5.1)

## 2013-07-04 MED ORDER — FLUTICASONE PROPIONATE 50 MCG/ACT NA SUSP: 2.0000 | Freq: Every day | NASAL | Status: DC

## 2013-07-04 MED ORDER — HYDROCHLOROTHIAZIDE 25 MG PO TABS: 25.0000 mg | ORAL_TABLET | Freq: Every day | ORAL | Status: DC

## 2013-07-04 NOTE — Assessment & Plan Note (Signed)
Symptomatically improved, for f/u with neuro as planned

## 2013-07-04 NOTE — Assessment & Plan Note (Signed)
stable overall by history and exam, recent data reviewed with pt, and pt to continue medical treatment as before,  to f/u any worsening symptoms or concerns Lab Results  Component Value Date   LDLCALC 110* 09/29/2011

## 2013-07-04 NOTE — Patient Instructions (Signed)
Please take all new medication as prescribed - the flonase for the allergies Please continue all other medications as before, and refills have been done if requested. You can also take Mucinex (or it's generic off brand) for congestion and right ear pressure/pain, and tylenol as needed for pain. Please keep your appointments with your specialists as you have planned Please go to the LAB in the Basement (turn left off the elevator) for the tests to be done today You will be contacted by phone if any changes need to be made immediately.  Otherwise, you will receive a letter about your results with an explanation, but please check with MyChart first.  Please remember to sign up for My Chart if you have not done so, as this will be important to you in the future with finding out test results, communicating by private email, and scheduling acute appointments online when needed.  Please return in 1 year for your yearly visit, or sooner if needed

## 2013-07-04 NOTE — Assessment & Plan Note (Signed)
Mild to mod, for mucinex otc prn,  to f/u any worsening symptoms or concerns 

## 2013-07-04 NOTE — Progress Notes (Addendum)
Subjective:    Patient ID: [name] Bell, female    DOB: 29-Mar-1941, 72 y.o.   MRN: [id number]  HPI  Here to f/u; overall doing ok,  Pt denies chest pain, increased sob or doe, wheezing, orthopnea, PND, increased LE swelling, palpitations, dizziness or syncope.  Pt denies polydipsia, polyuria, or low sugar symptoms such as weakness or confusion improved with po intake.  Pt denies new neurological symptoms such as new headache, or facial or extremity weakness or numbness.   Pt states overall good compliance with meds, has been trying to follow lower cholesterol diet, with wt overall stable,  but little exercise however. Is going to rehab in [facility]) for neuro-rehab, in addition to the botox.  Plans for 1 mo in [place] (home) in late [place], then [facility] to have further right foot surgury to correct prior.  Diuretic use every 3-4 days works for keeping recurring RLE swelling down. Does have several wks ongoing nasal allergy symptoms with clearish congestion, itch and sneezing, without fever, pain, ST, cough, swelling or wheezing, but has right ear pain/pressure recurring nightly, interferes with sleep. Past Medical History  Diagnosis Date  . 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 ALLERGIC RHINITIS 01/24/2008  . ANEMIA-IRON DEFICIENCY 01/24/2008    pt denied  . ANXIETY 02/26/2009  . BACK PAIN 09/30/2010  . DIZZINESS 02/28/2010  . ELEVATED BLOOD PRESSURE WITHOUT DIAGNOSIS OF HYPERTENSION 02/26/2009  . FATIGUE 02/26/2009  . GERD 01/24/2008  . HYPERLIPIDEMIA 08/16/2008  . SHINGLES 11/06/2010  . TOE PAIN 09/30/2010  . VARICOSE VEINS, LOWER EXTREMITIES 08/16/2008  . 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   Past Surgical History  Procedure Laterality Date  . Tonsillectomy    . Ectopic pregnancy surgery      at 72 yo with salpingectomy  . S/p bilateral breasts implants  12/2007  . S/p lumbar surgury  july 2010  . Foot surgery      bunion, hammer toe    reports that she has never smoked. She  has never used smokeless tobacco. She reports that  drinks alcohol. She reports that she does not use illicit drugs. family history includes Cancer in her father. No Known Allergies Current Outpatient Prescriptions on File Prior to Visit  Medication Sig Dispense Refill  . aspirin 81 MG tablet Take 81 mg by mouth daily.        Marland Kitchen b complex vitamins tablet Take 1 tablet by mouth daily.      . cholecalciferol (VITAMIN D) 1000 UNITS tablet Take 1,000 Units by mouth daily.      . clonazePAM (KLONOPIN) 1 MG tablet Take 1 tablet (1 mg total) by mouth 2 (two) times daily as needed for anxiety.  180 tablet  0  . glucosamine-chondroitin 500-400 MG tablet Take 1 tablet by mouth 3 (three) times daily.      . hydrochlorothiazide (HYDRODIURIL) 25 MG tablet Take 1 tablet (25 mg total) by mouth daily. As needed for swelling  90 tablet  3  . Multiple Vitamin (MULTIVITAMIN) tablet Take 1 tablet by mouth daily.      . Probiotic Product (PROBIOTIC DAILY PO) Take by mouth.       No current facility-administered medications on file prior to visit.    Review of Systems  Constitutional: Negative for unexpected weight change, or unusual diaphoresis  HENT: Negative for tinnitus.   Eyes: Negative for photophobia and visual disturbance.  Respiratory: Negative for choking and stridor.   Gastrointestinal: Negative for vomiting and blood in stool.  Genitourinary: Negative for hematuria and decreased urine volume.  Musculoskeletal: Negative for acute joint swelling Skin: Negative for color change and wound.  Neurological: Negative for tremors and numbness other than noted  Psychiatric/Behavioral: Negative for decreased concentration or  hyperactivity.       Objective:   Physical Exam BP 140/84  Pulse 53  Temp(Src) 97.1 F (36.2 C) (Oral)  Ht 5\' 4"  (1.626 m)  Wt 174 lb 8 oz (79.153 kg)  BMI 29.94 kg/m2  SpO2 95% VS noted,  Constitutional: Pt appears well-developed and well-nourished.  HENT: Head: NCAT.   Right Ear: External ear normal.  Left Ear: External ear normal.  Bilat tm's with mild erythema.  Max sinus areas non tender.  Pharynx with mild erythema, no exudate Eyes: Conjunctivae and EOM are normal. Pupils are equal, round, and reactive to light.  Neck: Normal range of motion. Neck supple.  Cardiovascular: Normal rate and regular rhythm.   Pulmonary/Chest: Effort normal and breath sounds normal.  Abd:  Soft, NT, non-distended, + BS Neurological: Pt is alert. Not confused , less dystonia noted Skin: Skin is warm. No erythema. No LE edema Psychiatric: Pt behavior is normal. Thought content normal.      Assessment & Plan:  Quality Measures addressed:  Mammogram:  pt declines and will self-refer

## 2013-07-04 NOTE — Assessment & Plan Note (Addendum)
Mild to mod, for flonase prn course,  to f/u any worsening symptoms or concerns

## 2013-07-04 NOTE — Assessment & Plan Note (Signed)
Stable Lab Results  Component Value Date   WBC 7.2 10/21/2012   HGB 13.8 10/21/2012   HCT 41.2 10/21/2012   PLT 329.0 10/21/2012   GLUCOSE 85 10/21/2012   CHOL 217* 10/21/2012   TRIG 58.0 10/21/2012   HDL 70.60 10/21/2012   LDLDIRECT 155.6 10/21/2012   LDLCALC 110* 09/29/2011   ALT 26 10/21/2012   AST 25 10/21/2012   NA 140 10/21/2012   K 4.0 10/21/2012   CL 106 10/21/2012   CREATININE 0.9 10/21/2012   BUN 17 10/21/2012   CO2 26 10/21/2012   TSH 1.39 10/21/2012   INR 0.9 07/17/2009

## 2013-07-08 DIAGNOSIS — R293 Abnormal posture: Secondary | ICD-10-CM | POA: Diagnosis not present

## 2013-07-08 DIAGNOSIS — IMO0001 Reserved for inherently not codable concepts without codable children: Secondary | ICD-10-CM | POA: Diagnosis not present

## 2013-07-08 DIAGNOSIS — M542 Cervicalgia: Secondary | ICD-10-CM | POA: Diagnosis not present

## 2013-07-08 DIAGNOSIS — G243 Spasmodic torticollis: Secondary | ICD-10-CM | POA: Diagnosis not present

## 2013-07-11 DIAGNOSIS — IMO0001 Reserved for inherently not codable concepts without codable children: Secondary | ICD-10-CM | POA: Diagnosis not present

## 2013-07-11 DIAGNOSIS — G243 Spasmodic torticollis: Secondary | ICD-10-CM | POA: Diagnosis not present

## 2013-07-11 DIAGNOSIS — R293 Abnormal posture: Secondary | ICD-10-CM | POA: Diagnosis not present

## 2013-07-11 DIAGNOSIS — M542 Cervicalgia: Secondary | ICD-10-CM | POA: Diagnosis not present

## 2013-07-18 DIAGNOSIS — G243 Spasmodic torticollis: Secondary | ICD-10-CM | POA: Diagnosis not present

## 2013-07-18 DIAGNOSIS — R293 Abnormal posture: Secondary | ICD-10-CM | POA: Diagnosis not present

## 2013-07-18 DIAGNOSIS — M542 Cervicalgia: Secondary | ICD-10-CM | POA: Diagnosis not present

## 2013-07-18 DIAGNOSIS — IMO0001 Reserved for inherently not codable concepts without codable children: Secondary | ICD-10-CM | POA: Diagnosis not present

## 2013-07-22 DIAGNOSIS — M542 Cervicalgia: Secondary | ICD-10-CM | POA: Diagnosis not present

## 2013-07-22 DIAGNOSIS — IMO0001 Reserved for inherently not codable concepts without codable children: Secondary | ICD-10-CM | POA: Diagnosis not present

## 2013-07-22 DIAGNOSIS — G243 Spasmodic torticollis: Secondary | ICD-10-CM | POA: Diagnosis not present

## 2013-07-22 DIAGNOSIS — R293 Abnormal posture: Secondary | ICD-10-CM | POA: Diagnosis not present

## 2013-07-25 DIAGNOSIS — R293 Abnormal posture: Secondary | ICD-10-CM | POA: Diagnosis not present

## 2013-07-25 DIAGNOSIS — IMO0001 Reserved for inherently not codable concepts without codable children: Secondary | ICD-10-CM | POA: Diagnosis not present

## 2013-07-25 DIAGNOSIS — G243 Spasmodic torticollis: Secondary | ICD-10-CM | POA: Diagnosis not present

## 2013-07-25 DIAGNOSIS — M542 Cervicalgia: Secondary | ICD-10-CM | POA: Diagnosis not present

## 2013-07-29 DIAGNOSIS — G243 Spasmodic torticollis: Secondary | ICD-10-CM | POA: Diagnosis not present

## 2013-07-29 DIAGNOSIS — IMO0001 Reserved for inherently not codable concepts without codable children: Secondary | ICD-10-CM | POA: Diagnosis not present

## 2013-07-29 DIAGNOSIS — R293 Abnormal posture: Secondary | ICD-10-CM | POA: Diagnosis not present

## 2013-08-01 DIAGNOSIS — IMO0001 Reserved for inherently not codable concepts without codable children: Secondary | ICD-10-CM | POA: Diagnosis not present

## 2013-08-01 DIAGNOSIS — G243 Spasmodic torticollis: Secondary | ICD-10-CM | POA: Diagnosis not present

## 2013-08-01 DIAGNOSIS — R293 Abnormal posture: Secondary | ICD-10-CM | POA: Diagnosis not present

## 2013-08-08 DIAGNOSIS — R293 Abnormal posture: Secondary | ICD-10-CM | POA: Diagnosis not present

## 2013-08-08 DIAGNOSIS — G243 Spasmodic torticollis: Secondary | ICD-10-CM | POA: Diagnosis not present

## 2013-08-08 DIAGNOSIS — IMO0001 Reserved for inherently not codable concepts without codable children: Secondary | ICD-10-CM | POA: Diagnosis not present

## 2013-08-12 ENCOUNTER — Encounter: Payer: Self-pay | Admitting: Neurology

## 2013-08-12 ENCOUNTER — Ambulatory Visit (INDEPENDENT_AMBULATORY_CARE_PROVIDER_SITE_OTHER): Payer: Medicare Other | Admitting: Neurology

## 2013-08-12 VITALS — BP 126/82 | HR 60 | Temp 97.8°F | Resp 14 | Wt 173.7 lb

## 2013-08-12 DIAGNOSIS — G243 Spasmodic torticollis: Secondary | ICD-10-CM | POA: Diagnosis not present

## 2013-08-12 NOTE — Procedures (Addendum)
[facility]   Procedure Note Botox  Attending: Dr. Lurena Joiner Jaystin Mcgarvey  Preoperative Diagnosis(es): Cervical Dystonia  Result History  Onset of effect: 10 days  Duration of Benefit: wore off about 2 weeks ago Adverse Effects: dysphagia in past, but pt very much wants to try the levator scapulae again because of how helpful it was.   Increased pain from eliminating this m last visit resulted in increase use of klonopin.   Consent obtained from: The patient  Benefits discussed included, but were not limited to decreased muscle tightness, increased joint range of motion, and decreased pain.  Risk discussed included, but were not limited pain and discomfort, bleeding, bruising, excessive weakness, venous thrombosis, muscle atrophy and dysphagia.  A copy of the patient medication guide was given to the patient which explains the blackbox warning.  Informed consent was obtained  Patients identity and treatment sites confirmed yes.     Details of Procedure: Skin was cleaned with alcohol.  A 30 gauge, 1/2 inch needle was introduced to the target muscle (except splenius capitus, posterior approach, where 27 inch, 1 1/2 gauge needle was used).  Prior to injection, the needle plunger was aspirated to make sure the needle was not within a blood vessel.  There was no blood retrieved on aspiration.    Following is a summary of the muscles injected  And the amount of Botulinum toxin used:   Dilution 0.9% preservative free saline mixed with 100 u Botox type A to make 10 U per 0.1cc  Injections  Location Left  Right Units Number of sites        Sternocleidomastoid 50+20 0 70 1  Splenius Capitus, posterior approach 0 100 100 1  Splenius Capitus, lateral approach 0 60 60 1  Levator Scapulae 30  30 0  Trapezius            TOTAL UNITS:   260    Agent: Botulinum Type A ( Onobotulinum Toxin type A ). 3 vials of Botox were used, 2  containing 100 units and freshly diluted with 1 mL of sterile,  non-preserved saline and 1 vial of 50 units diluted in same manner.   Total injected (Units): 260  Total wasted (Units): 40 units   Pt tolerated procedure well without complications.   Reinjection is anticipated in 3 months. Reassessment is anticipated in 4 weeks.

## 2013-08-12 NOTE — Progress Notes (Signed)
Pt here for botox.  Procedural note completed

## 2013-08-16 DIAGNOSIS — M7750 Other enthesopathy of unspecified foot: Secondary | ICD-10-CM | POA: Diagnosis not present

## 2013-09-21 ENCOUNTER — Ambulatory Visit: Payer: Medicare Other | Admitting: Neurology

## 2013-09-21 DIAGNOSIS — M79609 Pain in unspecified limb: Secondary | ICD-10-CM | POA: Diagnosis not present

## 2013-09-21 DIAGNOSIS — G8918 Other acute postprocedural pain: Secondary | ICD-10-CM | POA: Diagnosis not present

## 2013-09-21 DIAGNOSIS — Z79899 Other long term (current) drug therapy: Secondary | ICD-10-CM | POA: Diagnosis not present

## 2013-09-21 DIAGNOSIS — M205X9 Other deformities of toe(s) (acquired), unspecified foot: Secondary | ICD-10-CM | POA: Diagnosis not present

## 2013-10-04 DIAGNOSIS — M205X9 Other deformities of toe(s) (acquired), unspecified foot: Secondary | ICD-10-CM | POA: Diagnosis not present

## 2013-10-19 ENCOUNTER — Telehealth: Payer: Self-pay | Admitting: Neurology

## 2013-10-19 NOTE — Telephone Encounter (Signed)
Pt wants to move her Botox appt to 11/18/13. Is this OK? It is a Friday but not Botox Friday. I did not want to move it until Dr. Arbutus Leas gives [place] the OK to do so. Pt has appt on 11/11/13 to have a pin removed and can not come that date. Please advise- Thanks!

## 2013-10-27 NOTE — Telephone Encounter (Signed)
Pt appt moved from 3pm to 100pm on 11/04/13. Pt is aware. / [name] Bell

## 2013-10-27 NOTE — Telephone Encounter (Signed)
Spoke w/ pt. Pt advised she did not see immediate improvement with her swallowing after her last Botox injections. She says it tool about a week to "take hold" and lasted for 1.5 months. She says

## 2013-10-27 NOTE — Telephone Encounter (Signed)
Okay for nov 7 at 1, just make sure that we have prior [place]

## 2013-10-27 NOTE — Telephone Encounter (Signed)
Spoke w/ pt. She advised she did not see immediate improvement with her swallowing after her last Botox injections. She says it took about a week to "take hold" and lasted for 1.5 months. She says "issues with her head" were relieved almost immediately with the Botox injections. In the last 2 weeks she has seen less effectiveness from Botox in regards to her head but stresses she really likes how it makes her head feel. She is more bothered by the head issues than the swallowing issues and wants to proceed with next Botox injections.   Pt called to r/s 11/14 appt b/c of scheduling conflict. Offered pt an appt 11/18/13 -date requested by patient. Pt now says unable to come on 11/18/13. She wants to have Botox injection moved to 11/04/13 instead b/c it already seems to be wearing off and does not want to wait until 11/18/13 for the next injection. She requires an afternoon appt and has been r/s to 11/04/13 at 300pm / Sherri S.

## 2013-10-27 NOTE — Telephone Encounter (Signed)
Okay to move her to 11/21 at end of morning (11:15).  Find out how she did with swallowing after last set of injections

## 2013-11-02 DIAGNOSIS — M7750 Other enthesopathy of unspecified foot: Secondary | ICD-10-CM | POA: Diagnosis not present

## 2013-11-02 DIAGNOSIS — M205X9 Other deformities of toe(s) (acquired), unspecified foot: Secondary | ICD-10-CM | POA: Diagnosis not present

## 2013-11-03 DIAGNOSIS — H2510 Age-related nuclear cataract, unspecified eye: Secondary | ICD-10-CM | POA: Diagnosis not present

## 2013-11-04 ENCOUNTER — Ambulatory Visit (INDEPENDENT_AMBULATORY_CARE_PROVIDER_SITE_OTHER): Payer: Medicare Other | Admitting: Neurology

## 2013-11-04 ENCOUNTER — Other Ambulatory Visit: Payer: Self-pay

## 2013-11-04 ENCOUNTER — Ambulatory Visit: Payer: Medicare Other | Admitting: Neurology

## 2013-11-04 VITALS — BP 130/74 | HR 65 | Temp 97.6°F | Resp 14 | Ht 63.0 in | Wt 174.7 lb

## 2013-11-04 DIAGNOSIS — G243 Spasmodic torticollis: Secondary | ICD-10-CM

## 2013-11-04 MED ORDER — ONABOTULINUMTOXINA 100 UNITS IJ SOLR
100.0000 [IU] | Freq: Once | INTRAMUSCULAR | Status: AC
  Administered 2013-11-04: 100 [IU] via INTRAMUSCULAR

## 2013-11-04 MED ORDER — CLONAZEPAM 1 MG PO TABS: 1.0000 mg | ORAL_TABLET | Freq: Two times a day (BID) | ORAL | Status: DC | PRN

## 2013-11-04 NOTE — Progress Notes (Signed)
See procedural note for botox 

## 2013-11-04 NOTE — Procedures (Signed)
[facility]   Procedure Note Botox  Attending: Dr. Lurena Joiner Everest Brod  Preoperative Diagnosis(es): Cervical Dystonia  Result History  Onset of effect: 10 days  Duration of Benefit: wore off about 2 weeks ago but SE of dysphagia wore off after 4 weeks Adverse Effects: dysphagia.  I had a long talk with her about this.  I am very concerned and we discussed the morbidity and mortality associated with this.  She understands but is insistent that the botox helps so much that she is just "careful and cautious" what she eats for a few weeks following botox.  Her husband expressed understanding of these risks as well.   Consent obtained from: The patient  Benefits discussed included, but were not limited to decreased muscle tightness, increased joint range of motion, and decreased pain.  Risk discussed included, but were not limited pain and discomfort, bleeding, bruising, excessive weakness, venous thrombosis, muscle atrophy and dysphagia.  A copy of the patient medication guide was given to the patient which explains the blackbox warning.  Informed consent was obtained  Patients identity and treatment sites confirmed yes.     Details of Procedure: Skin was cleaned with alcohol.  A 30 gauge, 1/2 inch needle was introduced to the target muscle (except splenius capitus, posterior approach, where 27 inch, 1 1/2 gauge needle was used).  Prior to injection, the needle plunger was aspirated to make sure the needle was not within a blood vessel.  There was no blood retrieved on aspiration.    Following is a summary of the muscles injected  And the amount of Botulinum toxin used:   Dilution 0.9% preservative free saline mixed with 100 u Botox type A to make 10 U per 0.1cc  Injections  Location Left  Right Units Number of sites        Sternocleidomastoid 50+20 0 70 1  Splenius Capitus, posterior approach 0 60 100 1  Splenius Capitus, lateral approach 0 60 60 1  Levator Scapulae 30  30 0  Trapezius             TOTAL UNITS:   260    Agent: Botulinum Type A ( Onobotulinum Toxin type A ). 3 vials of Botox were used, 2  containing 100 units and freshly diluted with 1 mL of sterile, non-preserved saline and 1 vial of 50 units diluted in same manner.   Total injected (Units): 260  Total wasted (Units): 40 units   Pt tolerated procedure well without complications.   Reinjection is anticipated in 3 months.

## 2013-11-11 ENCOUNTER — Ambulatory Visit: Payer: Medicare Other | Admitting: Neurology

## 2013-11-22 ENCOUNTER — Ambulatory Visit: Payer: Medicare Other

## 2013-11-23 ENCOUNTER — Ambulatory Visit (INDEPENDENT_AMBULATORY_CARE_PROVIDER_SITE_OTHER): Payer: Medicare Other

## 2013-11-23 ENCOUNTER — Other Ambulatory Visit: Payer: Self-pay | Admitting: Internal Medicine

## 2013-11-23 DIAGNOSIS — Z23 Encounter for immunization: Secondary | ICD-10-CM

## 2013-11-23 DIAGNOSIS — Z0000 Encounter for general adult medical examination without abnormal findings: Secondary | ICD-10-CM

## 2013-12-30 DIAGNOSIS — M205X9 Other deformities of toe(s) (acquired), unspecified foot: Secondary | ICD-10-CM | POA: Diagnosis not present

## 2014-01-02 DIAGNOSIS — L570 Actinic keratosis: Secondary | ICD-10-CM | POA: Diagnosis not present

## 2014-02-10 ENCOUNTER — Ambulatory Visit (INDEPENDENT_AMBULATORY_CARE_PROVIDER_SITE_OTHER): Payer: Medicare Other | Admitting: Neurology

## 2014-02-10 VITALS — BP 144/82 | HR 60 | Ht 63.5 in | Wt 174.5 lb

## 2014-02-10 DIAGNOSIS — G243 Spasmodic torticollis: Secondary | ICD-10-CM | POA: Diagnosis not present

## 2014-02-10 MED ORDER — ONABOTULINUMTOXINA 100 UNITS IJ SOLR
300.0000 [IU] | Freq: Once | INTRAMUSCULAR | Status: AC
  Administered 2014-02-10: 300 [IU] via INTRAMUSCULAR

## 2014-02-10 NOTE — Procedures (Addendum)
[facility]   Procedure Note Botox  Attending: Dr. Wells Guiles Zamiah Tollett  Preoperative Diagnosis(es): Cervical Dystonia  Result History  Onset of effect: 10 days  Duration of Benefit: didn't seem to be as effective this past time.  Would like to try and increase dose again Adverse Effects: dysphagia.  I had a long talk with her about this.  I am very concerned and we discussed the morbidity and mortality associated with this.  She understands but is insistent that the botox helps so much that she is just "careful and cautious" what she eats for a few weeks following botox.     Consent obtained from: The patient  Benefits discussed included, but were not limited to decreased muscle tightness, increased joint range of motion, and decreased pain.  Risk discussed included, but were not limited pain and discomfort, bleeding, bruising, excessive weakness, venous thrombosis, muscle atrophy and dysphagia.  A copy of the patient medication guide was given to the patient which explains the blackbox warning.  Informed consent was obtained  Patients identity and treatment sites confirmed yes.     Details of Procedure: Skin was cleaned with alcohol.  A 30 gauge, 1/2 inch needle was introduced to the target muscle (except splenius capitus, posterior approach, where 27 inch, 1 1/2 gauge needle was used).  Prior to injection, the needle plunger was aspirated to make sure the needle was not within a blood vessel.  There was no blood retrieved on aspiration.    Following is a summary of the muscles injected  And the amount of Botulinum toxin used:   Dilution 0.9% preservative free saline mixed with 100 u Botox type A to make 10 U per 0.1cc  Injections  Location Left  Right Units Number of sites        Sternocleidomastoid 60+30 0 90 1  Splenius Capitus, posterior approach 0 100 100 1  Splenius Capitus, lateral approach 0 60 60 1  Levator Scapulae 30  30 0  Trapezius            TOTAL UNITS:   280     Agent: Botulinum Type A ( Onobotulinum Toxin type A ). 3 vials of Botox were used, containing 100 units and freshly diluted with 1 mL of sterile, non-preserved saline    Total injected (Units): 280  Total wasted (Units): 20 units   Pt tolerated procedure well without complications.   Reinjection is anticipated in 3 months.

## 2014-02-10 NOTE — Progress Notes (Signed)
See procedural note for botox

## 2014-02-20 DIAGNOSIS — IMO0001 Reserved for inherently not codable concepts without codable children: Secondary | ICD-10-CM | POA: Diagnosis not present

## 2014-02-20 DIAGNOSIS — G243 Spasmodic torticollis: Secondary | ICD-10-CM | POA: Diagnosis not present

## 2014-02-20 DIAGNOSIS — R293 Abnormal posture: Secondary | ICD-10-CM | POA: Diagnosis not present

## 2014-02-27 DIAGNOSIS — R293 Abnormal posture: Secondary | ICD-10-CM | POA: Diagnosis not present

## 2014-02-27 DIAGNOSIS — IMO0001 Reserved for inherently not codable concepts without codable children: Secondary | ICD-10-CM | POA: Diagnosis not present

## 2014-02-27 DIAGNOSIS — G243 Spasmodic torticollis: Secondary | ICD-10-CM | POA: Diagnosis not present

## 2014-03-06 DIAGNOSIS — G243 Spasmodic torticollis: Secondary | ICD-10-CM | POA: Diagnosis not present

## 2014-03-06 DIAGNOSIS — R293 Abnormal posture: Secondary | ICD-10-CM | POA: Diagnosis not present

## 2014-03-06 DIAGNOSIS — IMO0001 Reserved for inherently not codable concepts without codable children: Secondary | ICD-10-CM | POA: Diagnosis not present

## 2014-03-10 DIAGNOSIS — G243 Spasmodic torticollis: Secondary | ICD-10-CM | POA: Diagnosis not present

## 2014-03-10 DIAGNOSIS — R293 Abnormal posture: Secondary | ICD-10-CM | POA: Diagnosis not present

## 2014-03-10 DIAGNOSIS — IMO0001 Reserved for inherently not codable concepts without codable children: Secondary | ICD-10-CM | POA: Diagnosis not present

## 2014-03-13 DIAGNOSIS — R293 Abnormal posture: Secondary | ICD-10-CM | POA: Diagnosis not present

## 2014-03-13 DIAGNOSIS — G243 Spasmodic torticollis: Secondary | ICD-10-CM | POA: Diagnosis not present

## 2014-03-13 DIAGNOSIS — IMO0001 Reserved for inherently not codable concepts without codable children: Secondary | ICD-10-CM | POA: Diagnosis not present

## 2014-03-14 ENCOUNTER — Ambulatory Visit: Payer: Medicare Other | Admitting: Neurology

## 2014-03-17 ENCOUNTER — Ambulatory Visit: Payer: Medicare Other | Admitting: Neurology

## 2014-03-17 DIAGNOSIS — IMO0001 Reserved for inherently not codable concepts without codable children: Secondary | ICD-10-CM | POA: Diagnosis not present

## 2014-03-17 DIAGNOSIS — G243 Spasmodic torticollis: Secondary | ICD-10-CM | POA: Diagnosis not present

## 2014-03-17 DIAGNOSIS — R293 Abnormal posture: Secondary | ICD-10-CM | POA: Diagnosis not present

## 2014-03-21 ENCOUNTER — Ambulatory Visit: Payer: Medicare Other | Admitting: Neurology

## 2014-03-28 ENCOUNTER — Ambulatory Visit: Payer: Medicare Other | Admitting: Neurology

## 2014-04-03 ENCOUNTER — Telehealth: Payer: Self-pay | Admitting: Neurology

## 2014-04-03 DIAGNOSIS — R293 Abnormal posture: Secondary | ICD-10-CM | POA: Diagnosis not present

## 2014-04-03 DIAGNOSIS — G243 Spasmodic torticollis: Secondary | ICD-10-CM | POA: Diagnosis not present

## 2014-04-03 DIAGNOSIS — IMO0001 Reserved for inherently not codable concepts without codable children: Secondary | ICD-10-CM | POA: Diagnosis not present

## 2014-04-03 NOTE — Telephone Encounter (Signed)
Patient actually called because she missed her follow up appt a couple weeks ago and wanted to know if she could have a follow up visit on the same day as her Botox or if she needed to schedule a separate follow up appt. Patient advised to schedule a separate follow up appt. She did not have her calendar with her so she will call back to schedule that appt. She will let us know if she needs anything else.

## 2014-04-03 NOTE — Telephone Encounter (Signed)
Does pt need May Botox or can she come in sooner? CB# [phone] / Sherri S.

## 2014-04-04 ENCOUNTER — Encounter: Payer: Self-pay | Admitting: Neurology

## 2014-04-04 ENCOUNTER — Ambulatory Visit (INDEPENDENT_AMBULATORY_CARE_PROVIDER_SITE_OTHER): Payer: Medicare Other | Admitting: Neurology

## 2014-04-04 VITALS — BP 120/78 | HR 78 | Wt 173.3 lb

## 2014-04-04 DIAGNOSIS — R1310 Dysphagia, unspecified: Secondary | ICD-10-CM

## 2014-04-04 DIAGNOSIS — G243 Spasmodic torticollis: Secondary | ICD-10-CM | POA: Diagnosis not present

## 2014-04-04 MED ORDER — CLONAZEPAM 1 MG PO TABS: 1.0000 mg | ORAL_TABLET | Freq: Two times a day (BID) | ORAL | Status: DC | PRN

## 2014-04-04 NOTE — Progress Notes (Signed)
[place] Service was seen today in neurologic f/u regarding cervical dystonia.   She is accompanied by her husband today who supplements the history.   Her last set of injections was on 02/10/14.  She is really very happy with the injections.  She continues to report some dysphagia but insists that she doesn't want to change the injections or drop the dose.  No choking but she does have to avoid "crumbly" foods.  "I can finally look straight."  She does carry water with her everywhere.  Her husband states that he is actually much less worried than he was previously.  She does use klonopin to help with the discomfort and is still working with the therapy which helps as well.   PREVIOUS MEDICATIONS: Clonazepam  ALLERGIES:  No Known Allergies  CURRENT MEDICATIONS:  Current Outpatient Prescriptions on File Prior to Visit  Medication Sig Dispense Refill  . aspirin 81 MG tablet Take 81 mg by mouth daily.        Marland Kitchen b complex vitamins tablet Take 1 tablet by mouth daily.      . cholecalciferol (VITAMIN D) 1000 UNITS tablet Take 1,000 Units by mouth daily.      . clonazePAM (KLONOPIN) 1 MG tablet Take 1 tablet (1 mg total) by mouth 2 (two) times daily as needed for anxiety.  60 tablet  3  . fluticasone (FLONASE) 50 MCG/ACT nasal spray Place 2 sprays into the nose daily.  16 g  5  . glucosamine-chondroitin 500-400 MG tablet Take 1 tablet by mouth 3 (three) times daily.      . hydrochlorothiazide (HYDRODIURIL) 25 MG tablet Take 1 tablet (25 mg total) by mouth daily. As needed for swelling  90 tablet  3  . Multiple Vitamin (MULTIVITAMIN) tablet Take 1 tablet by mouth daily.      . Probiotic Product (PROBIOTIC DAILY PO) Take by mouth.       No current facility-administered medications on file prior to visit.    PAST MEDICAL HISTORY:   Past Medical History  Diagnosis Date  . 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 ALLERGIC RHINITIS 01/24/2008  . ANEMIA-IRON DEFICIENCY 01/24/2008    pt denied  .  ANXIETY 02/26/2009  . BACK PAIN 09/30/2010  . DIZZINESS 02/28/2010  . ELEVATED BLOOD PRESSURE WITHOUT DIAGNOSIS OF HYPERTENSION 02/26/2009  . FATIGUE 02/26/2009  . GERD 01/24/2008  . HYPERLIPIDEMIA 08/16/2008  . SHINGLES 11/06/2010  . TOE PAIN 09/30/2010  . VARICOSE VEINS, LOWER EXTREMITIES 08/16/2008  . 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    PAST SURGICAL HISTORY:   Past Surgical History  Procedure Laterality Date  . Tonsillectomy    . Ectopic pregnancy surgery      at 73 yo with salpingectomy  . S/p bilateral breasts implants  12/2007  . S/p lumbar surgury  july 2010  . Foot surgery      bunion, hammer toe    SOCIAL HISTORY:   History   Social History  . Marital Status: Single    Spouse Name: N/A    Number of Children: 2  . Years of Education: N/A   Occupational History  . unemployed housewife    Social History Main Topics  . Smoking status: Never Smoker   . Smokeless tobacco: Never Used  . Alcohol Use: Yes     Comment: 1 glass wine/day  . Drug Use: No  . Sexual Activity: Not on file   Other Topics Concern  . Not on file   Social History Narrative  .  No narrative on file    FAMILY HISTORY:   Family Status  Relation Status Death Age  . Mother Deceased March 03, 2009   osteoporosis, enlarged heart  . Father Deceased     lung CA  . Brother Deceased     MI, asthma  . Brother Alive   . Sister Alive     2, healthy  . Child Alive     2, healthy    ROS:  A complete 10 system review of systems was obtained and was unremarkable apart from what is mentioned above.  PHYSICAL EXAMINATION:    VITALS:   Filed Vitals:   04/04/14 1246  BP: 120/78  Pulse: 78  Weight: 173 lb 5 oz (78.614 kg)  SpO2: 97%    GEN:  Normal appears female in no acute distress.  Appears stated age.   NEUROLOGICAL: Orientation:  The patient is alert and oriented x 3.  Fund of knowledge is appropriate.  Recent and remote memory intact.  Attention span and concentration normal.  Repeats and  names without difficulty. Cranial nerves: There is good facial symmetry. The pupils are equal round and reactive to light bilaterally. Fundoscopic exam reveals clear disc margins bilaterally. Extraocular muscles are intact and visual fields are full to confrontational testing. Speech is fluent and clear. Soft palate rises symmetrically and there is no tongue deviation. Hearing is intact to conversational tone. Tone: Tone is good in the upper and lower extremities Sensation: Sensation is intact to light touch throughout. Motor: Strength is 5/5 in the bilateral upper and lower extremities.  Shoulder shrug is equal and symmetric. There is no pronator drift.  There are no fasciculations noted. Gait and Station: The patient is able to ambulate without difficulty. Abnormal movements: The patient's head is still turned to the right but it is improved.  She still has some side bending to the L   IMPRESSION/PLAN:  1. severe cervical dystonia.  This is creating some neck discomfort.    -Her last botox injections were very efficacious, but we continue to have some problems with dysphagia.  She understands the risk of dysphagia again, but she states it helped tremendously when we were injecting the levator scapulae.    -Her clonazepam was refilled today.  [place] database for controlled substance was checked today.  -Next botox is anticipated in May.Marland Kitchen

## 2014-04-07 DIAGNOSIS — IMO0001 Reserved for inherently not codable concepts without codable children: Secondary | ICD-10-CM | POA: Diagnosis not present

## 2014-04-07 DIAGNOSIS — R293 Abnormal posture: Secondary | ICD-10-CM | POA: Diagnosis not present

## 2014-04-07 DIAGNOSIS — G243 Spasmodic torticollis: Secondary | ICD-10-CM | POA: Diagnosis not present

## 2014-04-11 DIAGNOSIS — M7750 Other enthesopathy of unspecified foot: Secondary | ICD-10-CM | POA: Diagnosis not present

## 2014-04-12 ENCOUNTER — Telehealth: Payer: Self-pay | Admitting: Neurology

## 2014-04-12 DIAGNOSIS — IMO0001 Reserved for inherently not codable concepts without codable children: Secondary | ICD-10-CM | POA: Diagnosis not present

## 2014-04-12 DIAGNOSIS — R293 Abnormal posture: Secondary | ICD-10-CM | POA: Diagnosis not present

## 2014-04-12 DIAGNOSIS — G243 Spasmodic torticollis: Secondary | ICD-10-CM | POA: Diagnosis not present

## 2014-04-12 NOTE — Telephone Encounter (Signed)
Pt states that we need to move her botox from 08-04-14 to 08-03-14 is this ok and she states that we may need to move the one in may but wanted to check since we are moving the one in aug please let us know

## 2014-04-12 NOTE — Telephone Encounter (Signed)
08/03/2014 not a Botox day. Please advise.

## 2014-04-13 NOTE — Telephone Encounter (Signed)
We had talked about this the day of her appt.  8/7 wasn't a botox day either but she was leaving the country 8/8, so we decided on 8/7 since it was still a Friday and I prefer to do botox on a Friday.  Was their a misunderstanding?  I thought that her may date was fine.  We discussed that as well.  I know that she was going to be just shy of 3 months but that is fine.

## 2014-04-13 NOTE — Telephone Encounter (Signed)
Spoke with patient. She got her date wrong for when she was going to [place]. She is leaving on 08/04/2014. Appt moved to 08/03/14 and I let her know if Dr Tat was unable to do this that we would call to r/s. We will keep her Botox on 05/12/14 as scheduled.

## 2014-04-17 DIAGNOSIS — IMO0001 Reserved for inherently not codable concepts without codable children: Secondary | ICD-10-CM | POA: Diagnosis not present

## 2014-04-17 DIAGNOSIS — R293 Abnormal posture: Secondary | ICD-10-CM | POA: Diagnosis not present

## 2014-04-17 DIAGNOSIS — G243 Spasmodic torticollis: Secondary | ICD-10-CM | POA: Diagnosis not present

## 2014-04-21 DIAGNOSIS — G243 Spasmodic torticollis: Secondary | ICD-10-CM | POA: Diagnosis not present

## 2014-04-21 DIAGNOSIS — IMO0001 Reserved for inherently not codable concepts without codable children: Secondary | ICD-10-CM | POA: Diagnosis not present

## 2014-04-21 DIAGNOSIS — R293 Abnormal posture: Secondary | ICD-10-CM | POA: Diagnosis not present

## 2014-04-24 DIAGNOSIS — G243 Spasmodic torticollis: Secondary | ICD-10-CM | POA: Diagnosis not present

## 2014-04-24 DIAGNOSIS — IMO0001 Reserved for inherently not codable concepts without codable children: Secondary | ICD-10-CM | POA: Diagnosis not present

## 2014-04-24 DIAGNOSIS — R293 Abnormal posture: Secondary | ICD-10-CM | POA: Diagnosis not present

## 2014-04-28 DIAGNOSIS — G243 Spasmodic torticollis: Secondary | ICD-10-CM | POA: Diagnosis not present

## 2014-04-28 DIAGNOSIS — R293 Abnormal posture: Secondary | ICD-10-CM | POA: Diagnosis not present

## 2014-04-28 DIAGNOSIS — IMO0001 Reserved for inherently not codable concepts without codable children: Secondary | ICD-10-CM | POA: Diagnosis not present

## 2014-05-09 ENCOUNTER — Other Ambulatory Visit: Payer: Self-pay | Admitting: Neurology

## 2014-05-09 DIAGNOSIS — G243 Spasmodic torticollis: Secondary | ICD-10-CM

## 2014-05-12 ENCOUNTER — Ambulatory Visit: Payer: Medicare Other | Admitting: Neurology

## 2014-05-12 ENCOUNTER — Encounter: Payer: Self-pay | Admitting: Neurology

## 2014-05-12 ENCOUNTER — Ambulatory Visit (INDEPENDENT_AMBULATORY_CARE_PROVIDER_SITE_OTHER): Payer: Medicare Other | Admitting: Neurology

## 2014-05-12 VITALS — BP 128/70 | HR 74 | Ht 64.0 in | Wt 172.0 lb

## 2014-05-12 DIAGNOSIS — G243 Spasmodic torticollis: Secondary | ICD-10-CM

## 2014-05-12 MED ORDER — ONABOTULINUMTOXINA 100 UNITS IJ SOLR
280.0000 [IU] | Freq: Once | INTRAMUSCULAR | Status: AC
  Administered 2014-05-12: 280 [IU] via INTRAMUSCULAR

## 2014-05-12 NOTE — Procedures (Signed)
[facility]   Procedure Note Botox  Attending: Dr. Wells Guiles Flordia Kassem  Preoperative Diagnosis(es): Cervical Dystonia  Result History  Onset of effect: 2 days Duration of Benefit: started wearing off 3 weeks ago, but worse over the past week Adverse Effects: dysphagia.    Consent obtained from: The patient  Benefits discussed included, but were not limited to decreased muscle tightness, increased joint range of motion, and decreased pain.  Risk discussed included, but were not limited pain and discomfort, bleeding, bruising, excessive weakness, venous thrombosis, muscle atrophy and dysphagia.  A copy of the patient medication guide was given to the patient which explains the blackbox warning.  Informed consent was obtained  Patients identity and treatment sites confirmed yes.     Details of Procedure: Skin was cleaned with alcohol.  A 30 gauge, 1/2 inch needle was introduced to the target muscle (except splenius capitus, posterior approach, where 27 inch, 1 1/2 gauge needle was used).  Prior to injection, the needle plunger was aspirated to make sure the needle was not within a blood vessel.  There was no blood retrieved on aspiration.    Following is a summary of the muscles injected  And the amount of Botulinum toxin used:   Dilution 0.9% preservative free saline mixed with 100 u Botox type A to make 10 U per 0.1cc  Injections  Location Left  Right Units Number of sites        Sternocleidomastoid 60+30 0 90 1  Splenius Capitus, posterior approach 0 100 100 1  Splenius Capitus, lateral approach 0 60 60 1  Levator Scapulae 30  30 0  Trapezius            TOTAL UNITS:   280    Agent: Botulinum Type A ( Onobotulinum Toxin type A ). 3 vials of Botox were used, containing 100 units and freshly diluted with 1 mL of sterile, non-preserved saline    Total injected (Units): 280  Total wasted (Units): 20 units   Pt tolerated procedure well without complications.   Reinjection is  anticipated in 3 months.

## 2014-05-26 ENCOUNTER — Telehealth: Payer: Self-pay | Admitting: Neurology

## 2014-05-26 NOTE — Telephone Encounter (Signed)
Please advise 

## 2014-05-26 NOTE — Telephone Encounter (Signed)
Does pt need a Botox follow up in between appts? Does Botox cause excessive saliva production? Please call [phone] / Sherri S.

## 2014-05-26 NOTE — Telephone Encounter (Signed)
It does not cause excessive saliva.  She does not need appt in between IF she is doing well and doesn't want to change anything next time.

## 2014-05-26 NOTE — Telephone Encounter (Signed)
Patient made aware. She will call back if she would like an appt but does not wish to schedule one right now.

## 2014-05-29 DIAGNOSIS — M542 Cervicalgia: Secondary | ICD-10-CM | POA: Diagnosis not present

## 2014-05-29 DIAGNOSIS — IMO0001 Reserved for inherently not codable concepts without codable children: Secondary | ICD-10-CM | POA: Diagnosis not present

## 2014-05-29 DIAGNOSIS — R293 Abnormal posture: Secondary | ICD-10-CM | POA: Diagnosis not present

## 2014-05-29 DIAGNOSIS — G243 Spasmodic torticollis: Secondary | ICD-10-CM | POA: Diagnosis not present

## 2014-05-30 ENCOUNTER — Ambulatory Visit: Payer: Medicare Other | Admitting: Internal Medicine

## 2014-05-31 ENCOUNTER — Ambulatory Visit (INDEPENDENT_AMBULATORY_CARE_PROVIDER_SITE_OTHER): Payer: Medicare Other | Admitting: Internal Medicine

## 2014-05-31 ENCOUNTER — Encounter: Payer: Self-pay | Admitting: Internal Medicine

## 2014-05-31 VITALS — BP 124/80 | HR 68 | Temp 98.0°F | Wt 164.0 lb

## 2014-05-31 DIAGNOSIS — H9209 Otalgia, unspecified ear: Secondary | ICD-10-CM

## 2014-05-31 DIAGNOSIS — J309 Allergic rhinitis, unspecified: Secondary | ICD-10-CM

## 2014-05-31 DIAGNOSIS — R059 Cough, unspecified: Secondary | ICD-10-CM

## 2014-05-31 DIAGNOSIS — R058 Other specified cough: Secondary | ICD-10-CM | POA: Insufficient documentation

## 2014-05-31 DIAGNOSIS — R05 Cough: Secondary | ICD-10-CM | POA: Diagnosis not present

## 2014-05-31 DIAGNOSIS — H9201 Otalgia, right ear: Secondary | ICD-10-CM | POA: Insufficient documentation

## 2014-05-31 MED ORDER — FLUTICASONE PROPIONATE 50 MCG/ACT NA SUSP: 2.0000 | Freq: Every day | NASAL | Status: DC

## 2014-05-31 NOTE — Assessment & Plan Note (Signed)
Ok for [employer] , as well mucinex otc as well, also for ENT referral

## 2014-05-31 NOTE — Assessment & Plan Note (Signed)
With eating solids, doubt stroke related, suspect functional, declines cxr

## 2014-05-31 NOTE — Progress Notes (Signed)
   Subjective:    Patient ID: Boston Service, female    DOB: April 10, 1941, 73 y.o.   MRN: [id number]  HPI  Here to fu , has some fluid in the back of the throat worse in the am, better to clear the throat,  Better to drink water, ? Worse after starting the botox, but has called neuro and not likely related. Has some increased problem with cough with eating , worse just after botox shots.  No aspiration, fever, worsening current cough and Pt denies chest pain, increased sob or doe, wheezing, orthopnea, PND, increased LE swelling, palpitations, dizziness or syncope.  She wants to cont the botox, husband (not here) is not so sure. Lost 10 lbs in the past yr, mostly intentionally, trying to follow a lower chol diet.  Has persistent chronic right ear painx 3 yrs as well Past Medical History  Diagnosis Date  . 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 ALLERGIC RHINITIS 01/24/2008  . ANEMIA-IRON DEFICIENCY 01/24/2008    pt denied  . ANXIETY 02/26/2009  . BACK PAIN 09/30/2010  . DIZZINESS 02/28/2010  . ELEVATED BLOOD PRESSURE WITHOUT DIAGNOSIS OF HYPERTENSION 02/26/2009  . FATIGUE 02/26/2009  . GERD 01/24/2008  . HYPERLIPIDEMIA 08/16/2008  . SHINGLES 11/06/2010  . TOE PAIN 09/30/2010  . VARICOSE VEINS, LOWER EXTREMITIES 08/16/2008  . 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   Past Surgical History  Procedure Laterality Date  . Tonsillectomy    . Ectopic pregnancy surgery      at 73 yo with salpingectomy  . S/p bilateral breasts implants  12/2007  . S/p lumbar surgury  july 2010  . Foot surgery      bunion, hammer toe    reports that she has never smoked. She has never used smokeless tobacco. She reports that she drinks alcohol. She reports that she does not use illicit drugs. family history includes Cancer in her father. No Known Allergies  Review of Systems  Constitutional: Negative for unusual diaphoresis or other sweats  HENT: Negative for ringing in ear Eyes: Negative for double vision or  worsening visual disturbance.  Respiratory: Negative for choking and stridor.   Gastrointestinal: Negative for vomiting or other signifcant bowel change Genitourinary: Negative for hematuria or decreased urine volume.  Musculoskeletal: Negative for other MSK pain or swelling Skin: Negative for color change and worsening wound.  Neurological: Negative for tremors and numbness other than noted  Psychiatric/Behavioral: Negative for decreased concentration or agitation other than above       Objective:   Physical Exam BP 124/80  Pulse 68  Temp(Src) 98 F (36.7 C) (Oral)  Wt 164 lb (74.39 kg)  SpO2 97% VS noted,  Constitutional: Pt appears well-developed, well-nourished.  HENT: Head: NCAT.  Right Ear: External ear normal.  Left Ear: External ear normal.  Eyes: . Pupils are equal, round, and reactive to light. Conjunctivae and EOM are normal Neck: Normal range of motion. Neck supple.  Cardiovascular: Normal rate and regular rhythm.   Right tm with mild erythema, ? Small effusion, left TM without red/effusion,  Max sinus areas non tender.  Pharynx with mild erythema, no exudate Pulmonary/Chest: Effort normal and breath sounds normal.  Abd:  Soft, NT, ND, + BS Neurological: Pt is alert. Not confused , motor grossly intact Skin: Skin is warm. No rash Psychiatric: Pt behavior is normal. No agitation.     Assessment & Plan:

## 2014-05-31 NOTE — Progress Notes (Signed)
Pre visit review using our clinic review tool, if applicable. No additional management support is needed unless otherwise documented below in the visit note. 

## 2014-05-31 NOTE — Assessment & Plan Note (Signed)
Ok for [employer] asd,  to f/u any worsening symptoms or concerns, I suspect this is cuase of throat congestion/post nasal gtt

## 2014-05-31 NOTE — Patient Instructions (Addendum)
Please take all new medication as prescribed - the flonase for what I think is post nasal gtt and causing congestion to the right ear eustachain tube  You should also take Mucinex otc twice per day to try to help with the right ear pain, as well as OTC antihistamine such as zyrtec  You will be contacted regarding the referral for: ENT for the right ear pain  Please continue all other medications as before, and refills have been done if requested. Please have the pharmacy call with any other refills you may need.  Please keep your appointments with your specialists as you have planned

## 2014-06-02 DIAGNOSIS — J387 Other diseases of larynx: Secondary | ICD-10-CM | POA: Diagnosis not present

## 2014-06-02 DIAGNOSIS — H9209 Otalgia, unspecified ear: Secondary | ICD-10-CM | POA: Diagnosis not present

## 2014-06-05 DIAGNOSIS — M542 Cervicalgia: Secondary | ICD-10-CM | POA: Diagnosis not present

## 2014-06-05 DIAGNOSIS — IMO0001 Reserved for inherently not codable concepts without codable children: Secondary | ICD-10-CM | POA: Diagnosis not present

## 2014-06-05 DIAGNOSIS — R293 Abnormal posture: Secondary | ICD-10-CM | POA: Diagnosis not present

## 2014-06-05 DIAGNOSIS — G243 Spasmodic torticollis: Secondary | ICD-10-CM | POA: Diagnosis not present

## 2014-06-09 DIAGNOSIS — M542 Cervicalgia: Secondary | ICD-10-CM | POA: Diagnosis not present

## 2014-06-09 DIAGNOSIS — G243 Spasmodic torticollis: Secondary | ICD-10-CM | POA: Diagnosis not present

## 2014-06-09 DIAGNOSIS — R293 Abnormal posture: Secondary | ICD-10-CM | POA: Diagnosis not present

## 2014-06-09 DIAGNOSIS — IMO0001 Reserved for inherently not codable concepts without codable children: Secondary | ICD-10-CM | POA: Diagnosis not present

## 2014-06-14 DIAGNOSIS — G243 Spasmodic torticollis: Secondary | ICD-10-CM | POA: Diagnosis not present

## 2014-06-14 DIAGNOSIS — R293 Abnormal posture: Secondary | ICD-10-CM | POA: Diagnosis not present

## 2014-06-14 DIAGNOSIS — IMO0001 Reserved for inherently not codable concepts without codable children: Secondary | ICD-10-CM | POA: Diagnosis not present

## 2014-06-14 DIAGNOSIS — M542 Cervicalgia: Secondary | ICD-10-CM | POA: Diagnosis not present

## 2014-06-19 DIAGNOSIS — G243 Spasmodic torticollis: Secondary | ICD-10-CM | POA: Diagnosis not present

## 2014-06-19 DIAGNOSIS — M542 Cervicalgia: Secondary | ICD-10-CM | POA: Diagnosis not present

## 2014-06-19 DIAGNOSIS — IMO0001 Reserved for inherently not codable concepts without codable children: Secondary | ICD-10-CM | POA: Diagnosis not present

## 2014-06-19 DIAGNOSIS — R293 Abnormal posture: Secondary | ICD-10-CM | POA: Diagnosis not present

## 2014-07-03 DIAGNOSIS — L570 Actinic keratosis: Secondary | ICD-10-CM | POA: Diagnosis not present

## 2014-07-05 ENCOUNTER — Other Ambulatory Visit (INDEPENDENT_AMBULATORY_CARE_PROVIDER_SITE_OTHER): Payer: Medicare Other

## 2014-07-05 ENCOUNTER — Encounter: Payer: Medicare Other | Admitting: Internal Medicine

## 2014-07-05 ENCOUNTER — Encounter: Payer: Self-pay | Admitting: Internal Medicine

## 2014-07-05 ENCOUNTER — Telehealth: Payer: Self-pay

## 2014-07-05 DIAGNOSIS — Z0000 Encounter for general adult medical examination without abnormal findings: Secondary | ICD-10-CM

## 2014-07-05 DIAGNOSIS — E785 Hyperlipidemia, unspecified: Secondary | ICD-10-CM

## 2014-07-05 DIAGNOSIS — Z0289 Encounter for other administrative examinations: Secondary | ICD-10-CM

## 2014-07-05 LAB — BASIC METABOLIC PANEL
BUN: 13 mg/dL (ref 6–23)
CO2: 27 mEq/L (ref 19–32)
Calcium: 9.2 mg/dL (ref 8.4–10.5)
Chloride: 100 mEq/L (ref 96–112)
Creatinine, Ser: 0.8 mg/dL (ref 0.4–1.2)
GFR: 79.71 mL/min (ref >60.00)
Glucose, Bld: 90 mg/dL (ref 70–99)
POTASSIUM: 3.5 meq/L (ref 3.5–5.1)
SODIUM: 135 meq/L (ref 135–145)

## 2014-07-05 LAB — URINALYSIS, ROUTINE W REFLEX MICROSCOPIC
Bilirubin Urine: NEGATIVE
Hgb urine dipstick: NEGATIVE
Ketones, ur: NEGATIVE
NITRITE: NEGATIVE
SPECIFIC GRAVITY, URINE: 1.01 (ref 1.000–1.030)
Total Protein, Urine: NEGATIVE
UROBILINOGEN UA: 0.2 (ref 0.0–1.0)
Urine Glucose: NEGATIVE
pH: 7 (ref 5.0–8.0)

## 2014-07-05 NOTE — Telephone Encounter (Signed)
cpx labs entered  

## 2014-07-20 DIAGNOSIS — L259 Unspecified contact dermatitis, unspecified cause: Secondary | ICD-10-CM | POA: Diagnosis not present

## 2014-07-27 DIAGNOSIS — J069 Acute upper respiratory infection, unspecified: Secondary | ICD-10-CM | POA: Diagnosis not present

## 2014-07-27 DIAGNOSIS — R5383 Other fatigue: Secondary | ICD-10-CM | POA: Diagnosis not present

## 2014-07-27 DIAGNOSIS — M545 Low back pain, unspecified: Secondary | ICD-10-CM | POA: Diagnosis not present

## 2014-07-27 DIAGNOSIS — S9030XA Contusion of unspecified foot, initial encounter: Secondary | ICD-10-CM | POA: Diagnosis not present

## 2014-07-27 DIAGNOSIS — R5381 Other malaise: Secondary | ICD-10-CM | POA: Diagnosis not present

## 2014-08-01 ENCOUNTER — Telehealth: Payer: Self-pay | Admitting: Neurology

## 2014-08-01 MED ORDER — CLONAZEPAM 1 MG PO TABS: 1.0000 mg | ORAL_TABLET | Freq: Two times a day (BID) | ORAL | Status: DC | PRN

## 2014-08-01 NOTE — Telephone Encounter (Signed)
Just make sure that it is time to refill and if so, that is fine.

## 2014-08-01 NOTE — Addendum Note (Signed)
Addended byAnnamaria Helling on: 08/01/2014 03:38 PM   Modules accepted: Orders

## 2014-08-01 NOTE — Telephone Encounter (Signed)
Pt called and needs to talk to someone about medication refill please call [phone]

## 2014-08-01 NOTE — Telephone Encounter (Signed)
Refill phoned to pharmacy.

## 2014-08-01 NOTE — Telephone Encounter (Signed)
Patient would like refill of Clonazepam. Please advise if okay to fill?

## 2014-08-03 ENCOUNTER — Ambulatory Visit (INDEPENDENT_AMBULATORY_CARE_PROVIDER_SITE_OTHER): Payer: Medicare Other | Admitting: Neurology

## 2014-08-03 ENCOUNTER — Encounter: Payer: Self-pay | Admitting: Neurology

## 2014-08-03 VITALS — BP 138/68 | HR 70 | Temp 97.6°F | Resp 18 | Wt 164.6 lb

## 2014-08-03 DIAGNOSIS — G243 Spasmodic torticollis: Secondary | ICD-10-CM

## 2014-08-03 MED ORDER — ONABOTULINUMTOXINA 100 UNITS IJ SOLR
300.0000 [IU] | Freq: Once | INTRAMUSCULAR | Status: AC
  Administered 2014-08-03: 300 [IU] via INTRAMUSCULAR

## 2014-08-03 NOTE — Procedures (Signed)
[facility]   Procedure Note Botox  Attending: Dr. Delroy Ordway  Preoperative Diagnosis(es): Cervical Dystonia  Result History  Onset of effect: 2 days Duration of Benefit: states that this was more effective than any other time we have done botox injections Adverse Effects: dysphagia.    Consent obtained from: The patient  Benefits discussed included, but were not limited to decreased muscle tightness, increased joint range of motion, and decreased pain.  Risk discussed included, but were not limited pain and discomfort, bleeding, bruising, excessive weakness, venous thrombosis, muscle atrophy and dysphagia.  A copy of the patient medication guide was given to the patient which explains the blackbox warning.  Informed consent was obtained  Patients identity and treatment sites confirmed yes.     Details of Procedure: Skin was cleaned with alcohol.  A 30 gauge, 1/2 inch needle was introduced to the target muscle (except splenius capitus, posterior approach, where 27 inch, 1 1/2 gauge needle was used).  Prior to injection, the needle plunger was aspirated to make sure the needle was not within a blood vessel.  There was no blood retrieved on aspiration.    Following is a summary of the muscles injected  And the amount of Botulinum toxin used:   Dilution 0.9% preservative free saline mixed with 100 u Botox type A to make 10 U per 0.1cc  Injections  Location Left  Right Units Number of sites        Sternocleidomastoid 60+30 0 90 1  Splenius Capitus, posterior approach 0 100 100 1  Splenius Capitus, lateral approach 0 60 60 1  Levator Scapulae 30  30 0  Trapezius            TOTAL UNITS:   280    Agent: Botulinum Type A ( Onobotulinum Toxin type A ). 3 vials of Botox were used, containing 100 units and freshly diluted with 1 mL of sterile, non-preserved saline    Total injected (Units): 280  Total wasted (Units): 20 units   Pt tolerated procedure well without  complications.   Reinjection is anticipated in 3 months.  

## 2014-08-04 ENCOUNTER — Ambulatory Visit: Payer: Medicare Other | Admitting: Neurology

## 2014-08-18 ENCOUNTER — Other Ambulatory Visit: Payer: Self-pay | Admitting: Internal Medicine

## 2014-09-21 ENCOUNTER — Ambulatory Visit (INDEPENDENT_AMBULATORY_CARE_PROVIDER_SITE_OTHER): Payer: Medicare Other | Admitting: Internal Medicine

## 2014-09-21 ENCOUNTER — Encounter: Payer: Self-pay | Admitting: Internal Medicine

## 2014-09-21 VITALS — BP 120/82 | HR 69 | Temp 98.0°F | Wt 166.1 lb

## 2014-09-21 DIAGNOSIS — R21 Rash and other nonspecific skin eruption: Secondary | ICD-10-CM

## 2014-09-21 DIAGNOSIS — Z23 Encounter for immunization: Secondary | ICD-10-CM | POA: Diagnosis not present

## 2014-09-21 DIAGNOSIS — R7309 Other abnormal glucose: Secondary | ICD-10-CM

## 2014-09-21 DIAGNOSIS — E785 Hyperlipidemia, unspecified: Secondary | ICD-10-CM

## 2014-09-21 DIAGNOSIS — R7302 Impaired glucose tolerance (oral): Secondary | ICD-10-CM

## 2014-09-21 DIAGNOSIS — F411 Generalized anxiety disorder: Secondary | ICD-10-CM

## 2014-09-21 MED ORDER — TRETINOIN 0.025 % EX CREA: TOPICAL_CREAM | Freq: Every day | CUTANEOUS | Status: DC

## 2014-09-21 NOTE — Progress Notes (Signed)
Subjective:    Patient ID: [name] Bell, female    DOB: 05/12/1941, 73 y.o.   MRN: [id number]  HPI  Here for yearly f/u;  Overall doing ok;  Pt denies CP, worsening SOB, DOE, wheezing, orthopnea, PND, worsening LE edema, palpitations, dizziness or syncope.  Pt denies neurological change such as new headache, facial or extremity weakness.  Pt denies polydipsia, polyuria, or low sugar symptoms. Pt states overall good compliance with treatment and medications, good tolerability, and has been trying to follow lower cholesterol diet.  Pt denies worsening depressive symptoms, suicidal ideation or panic. No fever, night sweats, wt loss, loss of appetite, or other constitutional symptoms.  Pt states good ability with ADL's, has low fall risk, home safety reviewed and adequate, no other significant changes in hearing or vision, and only occasionally active with exercise. Does have recurring rash to face improved with retina A previously , needs refill.  Denies worsening depressive symptoms, suicidal ideation, or panic Past Medical History  Diagnosis Date  . 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 ALLERGIC RHINITIS 01/24/2008  . ANEMIA-IRON DEFICIENCY 01/24/2008    pt denied  . ANXIETY 02/26/2009  . BACK PAIN 09/30/2010  . DIZZINESS 02/28/2010  . ELEVATED BLOOD PRESSURE WITHOUT DIAGNOSIS OF HYPERTENSION 02/26/2009  . FATIGUE 02/26/2009  . GERD 01/24/2008  . HYPERLIPIDEMIA 08/16/2008  . SHINGLES 11/06/2010  . TOE PAIN 09/30/2010  . VARICOSE VEINS, LOWER EXTREMITIES 08/16/2008  . 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   Past Surgical History  Procedure Laterality Date  . Tonsillectomy    . Ectopic pregnancy surgery      at 73 yo with salpingectomy  . S/p bilateral breasts implants  12/2007  . S/p lumbar surgury  july 2010  . Foot surgery      bunion, hammer toe    reports that she has never smoked. She has never used smokeless tobacco. She reports that she drinks alcohol. She reports that she does not  use illicit drugs. family history includes Cancer in her father. No Known Allergies Current Outpatient Prescriptions on File Prior to Visit  Medication Sig Dispense Refill  . aspirin 81 MG tablet Take 81 mg by mouth daily.        Marland Kitchen b complex vitamins tablet Take 1 tablet by mouth daily.      . cholecalciferol (VITAMIN D) 1000 UNITS tablet Take 1,000 Units by mouth daily.      . clonazePAM (KLONOPIN) 1 MG tablet Take 1 tablet (1 mg total) by mouth 2 (two) times daily as needed for anxiety.  60 tablet  5  . fluticasone (FLONASE) 50 MCG/ACT nasal spray Place 2 sprays into both nostrils daily.  16 g  5  . glucosamine-chondroitin 500-400 MG tablet Take 1 tablet by mouth 3 (three) times daily.      . hydrochlorothiazide (HYDRODIURIL) 25 MG tablet TAKE 1 TABLET DAILY AS NEEDED FOR SWELLING  90 tablet  3  . Multiple Vitamin (MULTIVITAMIN) tablet Take 1 tablet by mouth daily.      . Probiotic Product (PROBIOTIC DAILY PO) Take by mouth.       No current facility-administered medications on file prior to visit.   Review of Systems  Constitutional: Negative for increased diaphoresis, other activity, appetite or other siginficant weight change  HENT: Negative for worsening hearing loss, ear pain, facial swelling, mouth sores and neck stiffness.   Eyes: Negative for other worsening pain, redness or visual disturbance.  Respiratory: Negative for shortness of breath and wheezing.  Cardiovascular: Negative for chest pain and palpitations.  Gastrointestinal: Negative for diarrhea, blood in stool, abdominal distention or other pain Genitourinary: Negative for hematuria, flank pain or change in urine volume.  Musculoskeletal: Negative for myalgias or other joint complaints.  Skin: Negative for color change and wound. excerpt for small bruising medial left mid leg Neurological: Negative for syncope and numbness. other than noted Hematological: Negative for adenopathy. or other swelling Psychiatric/Behavioral:  Negative for hallucinations, self-injury, decreased concentration or other worsening agitation.     Objective:   Physical Exam BP 120/82  Pulse 69  Temp(Src) 98 F (36.7 C) (Oral)  Wt 166 lb 2 oz (75.354 kg)  SpO2 97% VS noted,  Constitutional: Pt is oriented to person, place, and time. Appears well-developed and well-nourished.  Head: Normocephalic and atraumatic.  Right Ear: External ear normal.  Left Ear: External ear normal.  Nose: Nose normal.  Mouth/Throat: Oropharynx is clear and moist.  Eyes: Conjunctivae and EOM are normal. Pupils are equal, round, and reactive to light.  Neck: Normal range of motion. Neck supple. No JVD present. No tracheal deviation present.  Cardiovascular: Normal rate, regular rhythm, normal heart sounds and intact distal pulses.   Pulmonary/Chest: Effort normal and breath sounds without rales or wheezing  Abdominal: Soft. Bowel sounds are normal. NT. No HSM  Musculoskeletal: Normal range of motion. Exhibits no edema.  Lymphadenopathy:  Has no cervical adenopathy.  Neurological: Pt is alert and oriented to person, place, and time. Pt has normal reflexes. No cranial nerve deficit. Motor grossly intact Skin: Skin is warm and dry. NT erythem rash noted to forehead  Psychiatric:  Has normal mood and affect. Behavior is normal.   Wt Readings from Last 3 Encounters:  09/21/14 166 lb 2 oz (75.354 kg)  08/03/14 164 lb 9.6 oz (74.662 kg)  05/31/14 164 lb (74.39 kg)       Assessment & Plan:

## 2014-09-21 NOTE — Patient Instructions (Addendum)
You had the flu shot today  Please consider a Nurse Visit appt in 2 weeks for the new Prevnar pneumonia shot  Please take all new medication as prescribed - the retin A  Please continue all other medications as before, and refills have been done if requested.  Please have the pharmacy call with any other refills you may need.  Please continue your efforts at being more active, low cholesterol diet, and weight control.  You are otherwise up to date with prevention measures today.  Please keep your appointments with your specialists as you may have planned  Please return in 6 months, or sooner if needed

## 2014-09-21 NOTE — Progress Notes (Signed)
Pre visit review using our clinic review tool, if applicable. No additional management support is needed unless otherwise documented below in the visit note. 

## 2014-09-23 DIAGNOSIS — R21 Rash and other nonspecific skin eruption: Secondary | ICD-10-CM | POA: Insufficient documentation

## 2014-09-23 NOTE — Assessment & Plan Note (Addendum)
stable overall by history and exam, recent data reviewed with pt, and pt to continue medical treatment as before,  to f/u any worsening symptoms or concerns Lab Results  Component Value Date   LDLCALC 110* 09/29/2011  declines further lab today

## 2014-09-23 NOTE — Assessment & Plan Note (Signed)
stable overall by history and exam, and pt to continue medical treatment as before,  to f/u any worsening symptoms or concerns 

## 2014-09-23 NOTE — Assessment & Plan Note (Signed)
[place] for retina A asd

## 2014-09-23 NOTE — Assessment & Plan Note (Signed)
stable overall by history and exam, recent data reviewed with pt, and pt to continue medical treatment as before,  to f/u any worsening symptoms or concerns Lab Results  Component Value Date   WBC 7.2 10/21/2012   HGB 13.8 10/21/2012   HCT 41.2 10/21/2012   PLT 329.0 10/21/2012   GLUCOSE 90 07/05/2014   CHOL 211* 07/04/2013   TRIG 68.0 07/04/2013   HDL 68.90 07/04/2013   LDLDIRECT 128.5 07/04/2013   LDLCALC 110* 09/29/2011   ALT 18 07/04/2013   AST 17 07/04/2013   NA 135 07/05/2014   K 3.5 07/05/2014   CL 100 07/05/2014   CREATININE 0.8 07/05/2014   BUN 13 07/05/2014   CO2 27 07/05/2014   TSH 1.39 10/21/2012   INR 0.9 07/17/2009

## 2014-09-26 DIAGNOSIS — M48061 Spinal stenosis, lumbar region without neurogenic claudication: Secondary | ICD-10-CM | POA: Diagnosis not present

## 2014-09-26 DIAGNOSIS — M2040 Other hammer toe(s) (acquired), unspecified foot: Secondary | ICD-10-CM | POA: Diagnosis not present

## 2014-10-24 ENCOUNTER — Ambulatory Visit (INDEPENDENT_AMBULATORY_CARE_PROVIDER_SITE_OTHER): Payer: Medicare Other

## 2014-10-24 DIAGNOSIS — Z23 Encounter for immunization: Secondary | ICD-10-CM | POA: Diagnosis not present

## 2014-11-03 ENCOUNTER — Ambulatory Visit (INDEPENDENT_AMBULATORY_CARE_PROVIDER_SITE_OTHER): Payer: Medicare Other | Admitting: Neurology

## 2014-11-03 ENCOUNTER — Encounter: Payer: Self-pay | Admitting: Neurology

## 2014-11-03 VITALS — BP 120/78 | HR 69 | Ht 63.0 in | Wt 170.6 lb

## 2014-11-03 DIAGNOSIS — G243 Spasmodic torticollis: Secondary | ICD-10-CM

## 2014-11-03 MED ORDER — ONABOTULINUMTOXINA 100 UNITS IJ SOLR
300.0000 [IU] | Freq: Once | INTRAMUSCULAR | Status: AC
  Administered 2014-11-03: 300 [IU] via INTRAMUSCULAR

## 2014-11-03 NOTE — Procedures (Signed)
[facility]   Procedure Note Botox  Attending: Dr. Tresean Mattix  Preoperative Diagnosis(es): Cervical Dystonia  Result History  Onset of effect: 2 days Duration of Benefit: states that this was more effective than any other time we have done botox injections Adverse Effects: dysphagia.    Consent obtained from: The patient  Benefits discussed included, but were not limited to decreased muscle tightness, increased joint range of motion, and decreased pain.  Risk discussed included, but were not limited pain and discomfort, bleeding, bruising, excessive weakness, venous thrombosis, muscle atrophy and dysphagia.  A copy of the patient medication guide was given to the patient which explains the blackbox warning.  Informed consent was obtained  Patients identity and treatment sites confirmed yes.     Details of Procedure: Skin was cleaned with alcohol.  A 30 gauge, 1/2 inch needle was introduced to the target muscle (except splenius capitus, posterior approach, where 27 inch, 1 1/2 gauge needle was used).  Prior to injection, the needle plunger was aspirated to make sure the needle was not within a blood vessel.  There was no blood retrieved on aspiration.    Following is a summary of the muscles injected  And the amount of Botulinum toxin used:   Dilution 0.9% preservative free saline mixed with 100 u Botox type A to make 10 U per 0.1cc  Injections  Location Left  Right Units Number of sites        Sternocleidomastoid 60+30 0 90 1  Splenius Capitus, posterior approach 0 100 100 1  Splenius Capitus, lateral approach 0 60 60 1  Levator Scapulae 30  30 0  Trapezius            TOTAL UNITS:   280    Agent: Botulinum Type A ( Onobotulinum Toxin type A ). 3 vials of Botox were used, containing 100 units and freshly diluted with 1 mL of sterile, non-preserved saline    Total injected (Units): 280  Total wasted (Units): 20 units   Pt tolerated procedure well without  complications.   Reinjection is anticipated in 3 months.  

## 2014-11-14 DIAGNOSIS — H2513 Age-related nuclear cataract, bilateral: Secondary | ICD-10-CM | POA: Diagnosis not present

## 2014-11-14 DIAGNOSIS — H43813 Vitreous degeneration, bilateral: Secondary | ICD-10-CM | POA: Diagnosis not present

## 2015-01-03 DIAGNOSIS — L570 Actinic keratosis: Secondary | ICD-10-CM | POA: Diagnosis not present

## 2015-01-12 DIAGNOSIS — M79671 Pain in right foot: Secondary | ICD-10-CM | POA: Diagnosis not present

## 2015-01-17 ENCOUNTER — Other Ambulatory Visit: Payer: Self-pay | Admitting: Neurology

## 2015-01-17 DIAGNOSIS — G243 Spasmodic torticollis: Secondary | ICD-10-CM

## 2015-01-26 ENCOUNTER — Telehealth: Payer: Self-pay | Admitting: Neurology

## 2015-01-26 ENCOUNTER — Ambulatory Visit (INDEPENDENT_AMBULATORY_CARE_PROVIDER_SITE_OTHER): Payer: Medicare Other | Admitting: Neurology

## 2015-01-26 ENCOUNTER — Encounter: Payer: Self-pay | Admitting: Neurology

## 2015-01-26 VITALS — Temp 97.8°F

## 2015-01-26 DIAGNOSIS — R202 Paresthesia of skin: Secondary | ICD-10-CM | POA: Diagnosis not present

## 2015-01-26 DIAGNOSIS — G243 Spasmodic torticollis: Secondary | ICD-10-CM | POA: Diagnosis not present

## 2015-01-26 MED ORDER — ONABOTULINUMTOXINA 100 UNITS IJ SOLR
300.0000 [IU] | Freq: Once | INTRAMUSCULAR | Status: AC
  Administered 2015-01-26: 300 [IU] via INTRAMUSCULAR

## 2015-01-26 NOTE — Telephone Encounter (Signed)
Referral to [facility] therapy center faxed to [phone] with confirmation received. They state they will contact the patient to start therapy.

## 2015-01-26 NOTE — Telephone Encounter (Signed)
Pt resch botox on 04-27-15 to a different time the same day

## 2015-01-26 NOTE — Procedures (Signed)
[facility]   Procedure Note Botox  Attending: Dr. Wells Guiles Lurene Robley  Preoperative Diagnosis(es): Cervical Dystonia  Result History  Onset of effect: 2 days Duration of Benefit: states that this was more effective than any other time we have done botox injections Adverse Effects: dysphagia.    Consent obtained from: The patient  Benefits discussed included, but were not limited to decreased muscle tightness, increased joint range of motion, and decreased pain.  Risk discussed included, but were not limited pain and discomfort, bleeding, bruising, excessive weakness, venous thrombosis, muscle atrophy and dysphagia.  A copy of the patient medication guide was given to the patient which explains the blackbox warning.  Informed consent was obtained  Patients identity and treatment sites confirmed yes.     Details of Procedure: Skin was cleaned with alcohol.  A 30 gauge, 1/2 inch needle was introduced to the target muscle (except splenius capitus, posterior approach, where 27 inch, 1 1/2 gauge needle was used).  Prior to injection, the needle plunger was aspirated to make sure the needle was not within a blood vessel.  There was no blood retrieved on aspiration.    Following is a summary of the muscles injected  And the amount of Botulinum toxin used:   Dilution 0.9% preservative free saline mixed with 100 u Botox type A to make 10 U per 0.1cc  Injections  Location Left  Right Units Number of sites        Sternocleidomastoid 60+30 0 90 1  Splenius Capitus, posterior approach 0 100 100 1  Splenius Capitus, lateral approach 0 60 60 1  Levator Scapulae 30  30 0  Trapezius            TOTAL UNITS:   280    Agent: Botulinum Type A ( Onobotulinum Toxin type A ). 3 vials of Botox were used, containing 100 units and freshly diluted with 1 mL of sterile, non-preserved saline    Total injected (Units): 280  Total wasted (Units): 20 units   Pt tolerated procedure well without  complications.   Reinjection is anticipated in 3 months.

## 2015-01-26 NOTE — Progress Notes (Signed)
[name] Bell was seen today in neurologic f/u regarding cervical dystonia.   She is accompanied by her husband today who supplements the history.   Her last set of injections was on 02/10/14.  She is really very happy with the injections.  She continues to report some dysphagia but insists that she doesn't want to change the injections or drop the dose.  No choking but she does have to avoid "crumbly" foods.  "I can finally look straight."  She does carry water with her everywhere.  Her husband states that he is actually much less worried than he was previously.  She does use klonopin to help with the discomfort and is still working with the therapy which helps as well.  01/26/15 update:  Pt presents for botox but asks about a different issue.  She has had several foot surgeries and now has paresthesias in the right foot.  She went back to the surgeon and was given neurontin 300 mg bid.  She only took it one time and it did seem to help but it may have made her a little groggy.  She wanted to ask me about the med before continuing to take it.   PREVIOUS MEDICATIONS: Clonazepam  ALLERGIES:  No Known Allergies  CURRENT MEDICATIONS:  Current Outpatient Prescriptions on File Prior to Visit  Medication Sig Dispense Refill  . aspirin 81 MG tablet Take 81 mg by mouth daily.      Marland Kitchen b complex vitamins tablet Take 1 tablet by mouth daily.    . cholecalciferol (VITAMIN D) 1000 UNITS tablet Take 1,000 Units by mouth daily.    . clonazePAM (KLONOPIN) 1 MG tablet Take 1 tablet (1 mg total) by mouth 2 (two) times daily as needed for anxiety. 60 tablet 5  . fluticasone (FLONASE) 50 MCG/ACT nasal spray Place 2 sprays into both nostrils daily. 16 g 5  . glucosamine-chondroitin 500-400 MG tablet Take 1 tablet by mouth 3 (three) times daily.    . hydrochlorothiazide (HYDRODIURIL) 25 MG tablet TAKE 1 TABLET DAILY AS NEEDED FOR SWELLING 90 tablet 3  . Multiple Vitamin (MULTIVITAMIN) tablet Take 1 tablet by mouth daily.     . Probiotic Product (PROBIOTIC DAILY PO) Take by mouth.    . tretinoin (RETIN-A) 0.025 % cream Apply topically at bedtime. 45 g 0   No current facility-administered medications on file prior to visit.    PAST MEDICAL HISTORY:   Past Medical History  Diagnosis Date  . 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 ALLERGIC RHINITIS 01/24/2008  . ANEMIA-IRON DEFICIENCY 01/24/2008    pt denied  . ANXIETY 02/26/2009  . BACK PAIN 09/30/2010  . DIZZINESS 02/28/2010  . ELEVATED BLOOD PRESSURE WITHOUT DIAGNOSIS OF HYPERTENSION 02/26/2009  . FATIGUE 02/26/2009  . GERD 01/24/2008  . HYPERLIPIDEMIA 08/16/2008  . SHINGLES 11/06/2010  . TOE PAIN 09/30/2010  . VARICOSE VEINS, LOWER EXTREMITIES 08/16/2008  . 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    PAST SURGICAL HISTORY:   Past Surgical History  Procedure Laterality Date  . Tonsillectomy    . Ectopic pregnancy surgery      at 74 yo with salpingectomy  . S/p bilateral breasts implants  12/2007  . S/p lumbar surgury  july 2010  . Foot surgery      bunion, hammer toe    SOCIAL HISTORY:   History   Social History  . Marital Status: Single    Spouse Name: N/A    Number of Children: 2  . Years of  Education: N/A   Occupational History  . unemployed housewife    Social History Main Topics  . Smoking status: Never Smoker   . Smokeless tobacco: Never Used  . Alcohol Use: Yes     Comment: 1 glass wine/day  . Drug Use: No  . Sexual Activity:    Partners: Male   Other Topics Concern  . Not on file   Social History Narrative    FAMILY HISTORY:   Family Status  Relation Status Death Age  . Mother Deceased 03-31-09   osteoporosis, enlarged heart  . Father Deceased     lung CA  . Brother Deceased     MI, asthma  . Brother Alive   . Sister Alive     2, healthy  . Child Alive     2, healthy    ROS:  A complete 10 system review of systems was obtained and was unremarkable apart from what is mentioned above.  PHYSICAL  EXAMINATION:    VITALS:   Filed Vitals:   01/26/15 1049  Temp: 97.8 F (36.6 C)  TempSrc: Oral    GEN:  Normal appears female in no acute distress.  Appears stated age.   NEUROLOGICAL: Orientation:  The patient is alert and oriented x 3.   Cranial nerves: There is good facial symmetry. Extraocular muscles are intact and visual fields are full to confrontational testing. Speech is fluent and clear. Soft palate rises symmetrically and there is no tongue deviation. Hearing is intact to conversational tone. Tone: Tone is good in the upper and lower extremities Sensation: Sensation is intact to light touch throughout. Motor: Strength is 5/5 in the bilateral upper and lower extremities.   Gait and Station: The patient is able to ambulate without difficulty. Abnormal movements: The patient's head is still turned to the right.  She has less side bending to the left.     IMPRESSION/PLAN:  1. severe cervical dystonia.  This is creating some neck discomfort.    -Continue botox. 2.  R foot paresthesias  -Talked about the neurontin.  Told her that I would try the 300 mg q hs for 2 weeks and then increase to bid dosing.

## 2015-01-26 NOTE — Addendum Note (Signed)
Addended byAnnamaria Helling on: 01/26/2015 01:03 PM   Modules accepted: Orders

## 2015-01-29 ENCOUNTER — Telehealth: Payer: Self-pay | Admitting: Neurology

## 2015-01-29 NOTE — Telephone Encounter (Signed)
Pt called requesting a refill for the DIAZEPAM  Pharmacy: [facility] in [place] C/b [phone]

## 2015-01-29 NOTE — Telephone Encounter (Signed)
Don't see this on the medication list. Please advise.

## 2015-01-29 NOTE — Telephone Encounter (Signed)
I have always given her clonazepam, not diazepam?

## 2015-01-30 MED ORDER — CLONAZEPAM 1 MG PO TABS: 1.0000 mg | ORAL_TABLET | Freq: Two times a day (BID) | ORAL | Status: DC | PRN

## 2015-01-30 NOTE — Telephone Encounter (Signed)
LMOM for patient to call back to clarify if she is asking for Clonazepam.

## 2015-01-30 NOTE — Telephone Encounter (Signed)
Spoke with patient and she is requesting clonazepam. RX sent to pharmacy.

## 2015-02-06 ENCOUNTER — Encounter: Payer: Self-pay | Admitting: Internal Medicine

## 2015-02-06 ENCOUNTER — Other Ambulatory Visit (INDEPENDENT_AMBULATORY_CARE_PROVIDER_SITE_OTHER): Payer: Medicare Other

## 2015-02-06 ENCOUNTER — Ambulatory Visit (INDEPENDENT_AMBULATORY_CARE_PROVIDER_SITE_OTHER): Payer: Medicare Other | Admitting: Internal Medicine

## 2015-02-06 VITALS — BP 134/92 | HR 74 | Temp 97.9°F | Ht 63.0 in | Wt 165.0 lb

## 2015-02-06 DIAGNOSIS — R7302 Impaired glucose tolerance (oral): Secondary | ICD-10-CM

## 2015-02-06 DIAGNOSIS — H6980 Other specified disorders of Eustachian tube, unspecified ear: Secondary | ICD-10-CM | POA: Insufficient documentation

## 2015-02-06 DIAGNOSIS — J069 Acute upper respiratory infection, unspecified: Secondary | ICD-10-CM

## 2015-02-06 DIAGNOSIS — H6983 Other specified disorders of Eustachian tube, bilateral: Secondary | ICD-10-CM

## 2015-02-06 DIAGNOSIS — R519 Headache, unspecified: Secondary | ICD-10-CM | POA: Insufficient documentation

## 2015-02-06 DIAGNOSIS — R109 Unspecified abdominal pain: Secondary | ICD-10-CM

## 2015-02-06 DIAGNOSIS — R739 Hyperglycemia, unspecified: Secondary | ICD-10-CM | POA: Insufficient documentation

## 2015-02-06 DIAGNOSIS — H6990 Unspecified Eustachian tube disorder, unspecified ear: Secondary | ICD-10-CM | POA: Insufficient documentation

## 2015-02-06 DIAGNOSIS — R51 Headache: Secondary | ICD-10-CM | POA: Diagnosis not present

## 2015-02-06 LAB — URINALYSIS, ROUTINE W REFLEX MICROSCOPIC
Bilirubin Urine: NEGATIVE
HGB URINE DIPSTICK: NEGATIVE
KETONES UR: NEGATIVE
Nitrite: NEGATIVE
RBC / HPF: NONE SEEN (ref 0–?)
Total Protein, Urine: NEGATIVE
UROBILINOGEN UA: 0.2 (ref 0.0–1.0)
Urine Glucose: NEGATIVE
pH: 6 (ref 5.0–8.0)

## 2015-02-06 LAB — BASIC METABOLIC PANEL
BUN: 13 mg/dL (ref 6–23)
CHLORIDE: 103 meq/L (ref 96–112)
CO2: 27 mEq/L (ref 19–32)
CREATININE: 0.84 mg/dL (ref 0.40–1.20)
Calcium: 9.4 mg/dL (ref 8.4–10.5)
GFR: 70.89 mL/min (ref >60.00)
GLUCOSE: 86 mg/dL (ref 70–99)
POTASSIUM: 3.7 meq/L (ref 3.5–5.1)
Sodium: 136 mEq/L (ref 135–145)

## 2015-02-06 LAB — HEMOGLOBIN A1C: Hgb A1c MFr Bld: 5.4 % (ref 4.6–6.5)

## 2015-02-06 MED ORDER — LEVOFLOXACIN 250 MG PO TABS: 250.0000 mg | ORAL_TABLET | Freq: Every day | ORAL | Status: DC

## 2015-02-06 NOTE — Assessment & Plan Note (Signed)
?   Msk, for UA as well

## 2015-02-06 NOTE — Assessment & Plan Note (Signed)
For f/u a1c,  to f/u any worsening symptoms or concerns

## 2015-02-06 NOTE — Assessment & Plan Note (Signed)
Overall mild, ? neurtiic vs other except does not seem migrainous, exam benign, for advil prn,  to f/u any worsening symptoms or concerns

## 2015-02-06 NOTE — Patient Instructions (Signed)
Please take all new medication as prescribed - the antibiotic  Please continue all other medications as before, and refills have been done if requested.  Please have the pharmacy call with any other refills you may need.  Please keep your appointments with your specialists as you may have planned  Please go to the LAB in the Basement (turn left off the elevator) for the tests to be done today   Please return in 6 months, or sooner if needed

## 2015-02-06 NOTE — Progress Notes (Signed)
Pre visit review using our clinic review tool, if applicable. No additional management support is needed unless otherwise documented below in the visit note. 

## 2015-02-06 NOTE — Telephone Encounter (Signed)
error 

## 2015-02-06 NOTE — Assessment & Plan Note (Signed)
Symptoms some out of proportion to exam, for antibx,  to f/u any worsening symptoms or concerns

## 2015-02-06 NOTE — Progress Notes (Signed)
Subjective:    Patient ID: [name] Bell, female    DOB: Jan 04, 1941, 74 y.o.   MRN: [id number]  HPI  Here to f/u with c/o right frontal HA knifelike, very uncomfortable to touch, also pain to shoulders, also near right shoulder blade area, and fatigue.  Has hx of pna with shingles to right frontal area 5 yrs ago. Denies fever, does have some ear bilat pain marked yesterday, sleep most of the day, with sweats last night despite the cold room, then cant warm up during the day. Denies urinary symptoms such as dysuria, frequency, urgency, , hematuria. Less appetite . + naiusea, no vomiting, no diarrhea, but does have flank tender pain/left.   Pt denies polydipsia, polyuria. Had BS 169 with labs in [facility] July 2015  Past Medical History  Diagnosis Date  . 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 ALLERGIC RHINITIS 01/24/2008  . ANEMIA-IRON DEFICIENCY 01/24/2008    pt denied  . ANXIETY 02/26/2009  . BACK PAIN 09/30/2010  . DIZZINESS 02/28/2010  . ELEVATED BLOOD PRESSURE WITHOUT DIAGNOSIS OF HYPERTENSION 02/26/2009  . FATIGUE 02/26/2009  . GERD 01/24/2008  . HYPERLIPIDEMIA 08/16/2008  . SHINGLES 11/06/2010  . TOE PAIN 09/30/2010  . VARICOSE VEINS, LOWER EXTREMITIES 08/16/2008  . 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   Past Surgical History  Procedure Laterality Date  . Tonsillectomy    . Ectopic pregnancy surgery      at 74 yo with salpingectomy  . S/p bilateral breasts implants  12/2007  . S/p lumbar surgury  july 2010  . Foot surgery      bunion, hammer toe    reports that she has never smoked. She has never used smokeless tobacco. She reports that she drinks alcohol. She reports that she does not use illicit drugs. family history includes Cancer in her father. No Known Allergies Current Outpatient Prescriptions on File Prior to Visit  Medication Sig Dispense Refill  . aspirin 81 MG tablet Take 81 mg by mouth daily.      Marland Kitchen b complex vitamins tablet Take 1 tablet by mouth daily.    .  cholecalciferol (VITAMIN D) 1000 UNITS tablet Take 1,000 Units by mouth daily.    . clonazePAM (KLONOPIN) 1 MG tablet Take 1 tablet (1 mg total) by mouth 2 (two) times daily as needed for anxiety. 60 tablet 5  . fluticasone (FLONASE) 50 MCG/ACT nasal spray Place 2 sprays into both nostrils daily. 16 g 5  . gabapentin (NEURONTIN) 300 MG capsule Take 300 mg by mouth at bedtime.   0  . glucosamine-chondroitin 500-400 MG tablet Take 1 tablet by mouth 3 (three) times daily.    . hydrochlorothiazide (HYDRODIURIL) 25 MG tablet TAKE 1 TABLET DAILY AS NEEDED FOR SWELLING 90 tablet 3  . Multiple Vitamin (MULTIVITAMIN) tablet Take 1 tablet by mouth daily.    . Probiotic Product (PROBIOTIC DAILY PO) Take by mouth.    . tretinoin (RETIN-A) 0.025 % cream Apply topically at bedtime. 45 g 0   No current facility-administered medications on file prior to visit.   Review of Systems  Constitutional: Negative for unusual diaphoresis or other sweats  HENT: Negative for ringing in ear Eyes: Negative for double vision or worsening visual disturbance.  Respiratory: Negative for choking and stridor.   Gastrointestinal: Negative for vomiting or other signifcant bowel change Genitourinary: Negative for hematuria or decreased urine volume.  Musculoskeletal: Negative for other MSK pain or swelling Skin: Negative for color change and worsening wound.  Neurological:  Negative for tremors and numbness other than noted  Psychiatric/Behavioral: Negative for decreased concentration or agitation other than above  '    Objective:   Physical Exam BP 134/92 mmHg  Pulse 74  Temp(Src) 97.9 F (36.6 C) (Oral)  Ht 5\' 3"  (1.6 m)  Wt 165 lb (74.844 kg)  BMI 29.24 kg/m2 VS noted, midl ill Constitutional: Pt appears well-developed, well-nourished.  HENT: Head: NCAT.  Right Ear: External ear normal.  Left Ear: External ear normal.  Left tm's with mod erythema.  Max sinus areas non tender.  Pharynx with mild erythema, no  exudate + bilat submandib LA Eyes: . Pupils are equal, round, and reactive to light. Conjunctivae and EOM are normal Neck: Normal range of motion. Neck supple.  Cardiovascular: Normal rate and regular rhythm.   Pulmonary/Chest: Effort normal and breath sounds without rales or wheezing.  Abd:  Soft NT, + BS, no guarding Neurological: Pt is alert. Not confused , motor grossly intact Skin: Skin is warm. No rash Psychiatric: Pt behavior is normal. No agitation. mild nervous    Assessment & Plan:

## 2015-02-06 NOTE — Assessment & Plan Note (Signed)
For mucinex otc prn 

## 2015-02-07 ENCOUNTER — Telehealth: Payer: Self-pay | Admitting: Internal Medicine

## 2015-02-07 NOTE — Telephone Encounter (Signed)
Pt called in and requested a call back from nurse about labs she had done on yesterday

## 2015-02-07 NOTE — Telephone Encounter (Signed)
Letter has been sent, all labs OK

## 2015-02-08 NOTE — Telephone Encounter (Signed)
Letter mailed

## 2015-02-14 ENCOUNTER — Other Ambulatory Visit: Payer: Self-pay | Admitting: Internal Medicine

## 2015-02-14 DIAGNOSIS — G243 Spasmodic torticollis: Secondary | ICD-10-CM | POA: Diagnosis not present

## 2015-02-14 DIAGNOSIS — Z5189 Encounter for other specified aftercare: Secondary | ICD-10-CM | POA: Diagnosis not present

## 2015-02-14 DIAGNOSIS — R293 Abnormal posture: Secondary | ICD-10-CM | POA: Diagnosis not present

## 2015-02-14 DIAGNOSIS — M791 Myalgia: Secondary | ICD-10-CM | POA: Diagnosis not present

## 2015-03-01 DIAGNOSIS — M791 Myalgia: Secondary | ICD-10-CM | POA: Diagnosis not present

## 2015-03-01 DIAGNOSIS — R293 Abnormal posture: Secondary | ICD-10-CM | POA: Diagnosis not present

## 2015-03-01 DIAGNOSIS — G243 Spasmodic torticollis: Secondary | ICD-10-CM | POA: Diagnosis not present

## 2015-03-01 DIAGNOSIS — Z5189 Encounter for other specified aftercare: Secondary | ICD-10-CM | POA: Diagnosis not present

## 2015-03-07 DIAGNOSIS — R293 Abnormal posture: Secondary | ICD-10-CM | POA: Diagnosis not present

## 2015-03-07 DIAGNOSIS — G243 Spasmodic torticollis: Secondary | ICD-10-CM | POA: Diagnosis not present

## 2015-03-07 DIAGNOSIS — M791 Myalgia: Secondary | ICD-10-CM | POA: Diagnosis not present

## 2015-03-07 DIAGNOSIS — Z5189 Encounter for other specified aftercare: Secondary | ICD-10-CM | POA: Diagnosis not present

## 2015-03-09 ENCOUNTER — Telehealth: Payer: Self-pay | Admitting: *Deleted

## 2015-03-09 NOTE — Telephone Encounter (Signed)
Patient given instructions per Dr. Posey Pronto.  She would still like to speak with Dr. Doristine Devoid nurse about this.  Please call her on Monday.

## 2015-03-09 NOTE — Telephone Encounter (Signed)
Patient called stating that she had Botox injection on Jan. 29 and is still having headaches and pain at one of the injection sites.  What should she take for headache?

## 2015-03-09 NOTE — Telephone Encounter (Signed)
This is Dr. Doristine Devoid patient.  She should first try tylenol and see if that helps.

## 2015-03-12 MED ORDER — TIZANIDINE HCL 2 MG PO TABS: 2.0000 mg | ORAL_TABLET | Freq: Two times a day (BID) | ORAL | Status: DC

## 2015-03-12 NOTE — Addendum Note (Signed)
Addended byAnnamaria Helling on: 03/12/2015 12:44 PM   Modules accepted: Orders

## 2015-03-12 NOTE — Telephone Encounter (Signed)
Give her RX for zanaflex 2 mg - 1 po q hs x 3 nights and then can try bid if needed

## 2015-03-12 NOTE — Telephone Encounter (Signed)
Patient made aware. RX sent to pharmacy. She will call back if this does not help.

## 2015-03-12 NOTE — Telephone Encounter (Signed)
I cannot imagine that 6 weeks out that an injection of botox would be causing a headache (esp since it is actually a treatment for headache).  Did she do the PT this time with her botox that she does sometimes?

## 2015-03-12 NOTE — Telephone Encounter (Signed)
Left message on machine for patient to call back.

## 2015-03-12 NOTE — Telephone Encounter (Signed)
Spoke with patient. She states that approximately a week after her Botox injection she developed pain in the area where she was injected (where her neck meets her shoulder). She states this started as a dull pain but started traveling up to her head and causing excruciating headaches that are occuring daily. She has been taking aspirin to control the pain which helps some but does not take away the pain completely. She states this is worse when she is active. She has been doing physical therapy twice weekly. Please advise.She would like [place] to call back on her cell phone [phone].

## 2015-03-13 DIAGNOSIS — Z5189 Encounter for other specified aftercare: Secondary | ICD-10-CM | POA: Diagnosis not present

## 2015-03-13 DIAGNOSIS — M791 Myalgia: Secondary | ICD-10-CM | POA: Diagnosis not present

## 2015-03-13 DIAGNOSIS — G243 Spasmodic torticollis: Secondary | ICD-10-CM | POA: Diagnosis not present

## 2015-03-13 DIAGNOSIS — R293 Abnormal posture: Secondary | ICD-10-CM | POA: Diagnosis not present

## 2015-03-16 DIAGNOSIS — R293 Abnormal posture: Secondary | ICD-10-CM | POA: Diagnosis not present

## 2015-03-16 DIAGNOSIS — M791 Myalgia: Secondary | ICD-10-CM | POA: Diagnosis not present

## 2015-03-16 DIAGNOSIS — Z5189 Encounter for other specified aftercare: Secondary | ICD-10-CM | POA: Diagnosis not present

## 2015-03-16 DIAGNOSIS — G243 Spasmodic torticollis: Secondary | ICD-10-CM | POA: Diagnosis not present

## 2015-03-20 DIAGNOSIS — R293 Abnormal posture: Secondary | ICD-10-CM | POA: Diagnosis not present

## 2015-03-20 DIAGNOSIS — Z5189 Encounter for other specified aftercare: Secondary | ICD-10-CM | POA: Diagnosis not present

## 2015-03-20 DIAGNOSIS — M791 Myalgia: Secondary | ICD-10-CM | POA: Diagnosis not present

## 2015-03-20 DIAGNOSIS — G243 Spasmodic torticollis: Secondary | ICD-10-CM | POA: Diagnosis not present

## 2015-03-26 DIAGNOSIS — R293 Abnormal posture: Secondary | ICD-10-CM | POA: Diagnosis not present

## 2015-03-26 DIAGNOSIS — G243 Spasmodic torticollis: Secondary | ICD-10-CM | POA: Diagnosis not present

## 2015-03-26 DIAGNOSIS — M791 Myalgia: Secondary | ICD-10-CM | POA: Diagnosis not present

## 2015-03-26 DIAGNOSIS — Z5189 Encounter for other specified aftercare: Secondary | ICD-10-CM | POA: Diagnosis not present

## 2015-03-27 ENCOUNTER — Ambulatory Visit (INDEPENDENT_AMBULATORY_CARE_PROVIDER_SITE_OTHER): Payer: Medicare Other | Admitting: Internal Medicine

## 2015-03-27 ENCOUNTER — Encounter: Payer: Self-pay | Admitting: Internal Medicine

## 2015-03-27 ENCOUNTER — Other Ambulatory Visit (INDEPENDENT_AMBULATORY_CARE_PROVIDER_SITE_OTHER): Payer: Medicare Other

## 2015-03-27 VITALS — BP 124/84 | HR 71 | Temp 98.3°F | Resp 18 | Ht 63.0 in | Wt 169.0 lb

## 2015-03-27 DIAGNOSIS — E785 Hyperlipidemia, unspecified: Secondary | ICD-10-CM

## 2015-03-27 DIAGNOSIS — F411 Generalized anxiety disorder: Secondary | ICD-10-CM | POA: Diagnosis not present

## 2015-03-27 DIAGNOSIS — R7302 Impaired glucose tolerance (oral): Secondary | ICD-10-CM | POA: Diagnosis not present

## 2015-03-27 LAB — TSH: TSH: 1.77 u[IU]/mL (ref 0.35–4.50)

## 2015-03-27 LAB — LIPID PANEL
CHOLESTEROL: 231 mg/dL — AB (ref 0–200)
HDL: 77.4 mg/dL (ref >39.00)
LDL CALC: 140 mg/dL — AB (ref 0–99)
NonHDL: 153.6
TRIGLYCERIDES: 70 mg/dL (ref 0.0–149.0)
Total CHOL/HDL Ratio: 3
VLDL: 14 mg/dL (ref 0.0–40.0)

## 2015-03-27 MED ORDER — TRETINOIN (FACIAL WRINKLES) 0.05 % EX CREA: TOPICAL_CREAM | CUTANEOUS | Status: DC

## 2015-03-27 NOTE — Progress Notes (Signed)
Subjective:    Patient ID: [name] Bell, female    DOB: [birth year], 74 y.o.   MRN: [id number]  HPI  Here for yearly f/u;  Overall doing ok;  Pt denies Chest pain, worsening SOB, DOE, wheezing, orthopnea, PND, worsening LE edema, palpitations, dizziness or syncope.  Pt denies neurological change such as new headache, facial or extremity weakness.  Pt denies polydipsia, polyuria, or low sugar symptoms. Pt states overall good compliance with treatment and medications, good tolerability, and has been trying to follow appropriate diet.  Pt denies worsening depressive symptoms, suicidal ideation or panic. No fever, night sweats, wt loss, loss of appetite, or other constitutional symptoms.  Pt states good ability with ADL's, has low fall risk, home safety reviewed and adequate, no other significant changes in hearing or vision, and only occasionally active with exercise. Wt Readings from Last 3 Encounters:  03/27/15 169 lb (76.658 kg)  02/06/15 165 lb (74.844 kg)  11/03/14 170 lb 9.6 oz (77.384 kg)   Rides stationary bike 30 min per day .  8000 steps per day for exercise daily.  Still seeing neuro for botox and PT for torticollis00000, seems to be helping Past Medical History  Diagnosis Date  . 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 ALLERGIC RHINITIS 01/24/2008  . ANEMIA-IRON DEFICIENCY 01/24/2008    pt denied  . ANXIETY 02/26/2009  . BACK PAIN 09/30/2010  . DIZZINESS 02/28/2010  . ELEVATED BLOOD PRESSURE WITHOUT DIAGNOSIS OF HYPERTENSION 02/26/2009  . FATIGUE 02/26/2009  . GERD 01/24/2008  . HYPERLIPIDEMIA 08/16/2008  . SHINGLES 11/06/2010  . TOE PAIN 09/30/2010  . VARICOSE VEINS, LOWER EXTREMITIES 08/16/2008  . 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   Past Surgical History  Procedure Laterality Date  . Tonsillectomy    . Ectopic pregnancy surgery      at 74 yo with salpingectomy  . S/p bilateral breasts implants  12/2007  . S/p lumbar surgury  july 2010  . Foot surgery      bunion, hammer  toe    reports that she has never smoked. She has never used smokeless tobacco. She reports that she drinks alcohol. She reports that she does not use illicit drugs. family history includes Cancer in her father. No Known Allergies Current Outpatient Prescriptions on File Prior to Visit  Medication Sig Dispense Refill  . aspirin 81 MG tablet Take 81 mg by mouth daily.      Marland Kitchen b complex vitamins tablet Take 1 tablet by mouth daily.    . cholecalciferol (VITAMIN D) 1000 UNITS tablet Take 1,000 Units by mouth daily.    . clonazePAM (KLONOPIN) 1 MG tablet Take 1 tablet (1 mg total) by mouth 2 (two) times daily as needed for anxiety. 60 tablet 5  . fluticasone (FLONASE) 50 MCG/ACT nasal spray Place 2 sprays into both nostrils daily. 16 g 5  . glucosamine-chondroitin 500-400 MG tablet Take 1 tablet by mouth 3 (three) times daily.    . hydrochlorothiazide (HYDRODIURIL) 25 MG tablet TAKE 1 TABLET DAILY AS NEEDED FOR SWELLING 90 tablet 3  . hydrochlorothiazide (HYDRODIURIL) 25 MG tablet Take 1 tablet (25 mg total) by mouth daily. 90 tablet 1  . Multiple Vitamin (MULTIVITAMIN) tablet Take 1 tablet by mouth daily.    . Probiotic Product (PROBIOTIC DAILY PO) Take by mouth.    . tretinoin (RETIN-A) 0.025 % cream Apply topically at bedtime. 45 g 0   No current facility-administered medications on file prior to visit.   Review of Systems Constitutional:  Negative for increased diaphoresis, other activity, appetite or siginficant weight change other than noted HENT: Negative for worsening hearing loss, ear pain, facial swelling, mouth sores and neck stiffness.   Eyes: Negative for other worsening pain, redness or visual disturbance.  Respiratory: Negative for shortness of breath and wheezing  Cardiovascular: Negative for chest pain and palpitations.  Gastrointestinal: Negative for diarrhea, blood in stool, abdominal distention or other pain Genitourinary: Negative for hematuria, flank pain or change in  urine volume.  Musculoskeletal: Negative for myalgias or other joint complaints.  Skin: Negative for color change and wound or drainage.  Neurological: Negative for syncope and numbness. other than noted Hematological: Negative for adenopathy. or other swelling Psychiatric/Behavioral: Negative for hallucinations, SI, self-injury, decreased concentration or other worsening agitation.      Objective:   Physical Exam BP 124/84 mmHg  Pulse 71  Temp(Src) 98.3 F (36.8 C) (Oral)  Resp 18  Ht 5\' 3"  (1.6 m)  Wt 169 lb (76.658 kg)  BMI 29.94 kg/m2  SpO2 95% VS noted,  Constitutional: Pt is oriented to person, place, and time. Appears well-developed and well-nourished, in no significant distress Head: Normocephalic and atraumatic.  Right Ear: External ear normal.  Left Ear: External ear normal.  Nose: Nose normal.  Mouth/Throat: Oropharynx is clear and moist.  Eyes: Conjunctivae and EOM are normal. Pupils are equal, round, and reactive to light.  Neck: Normal range of motion. Neck supple. No JVD present. No tracheal deviation present or significant neck LA or mass Cardiovascular: Normal rate, regular rhythm, normal heart sounds and intact distal pulses.   Pulmonary/Chest: Effort normal and breath sounds without rales or wheezing  Abdominal: Soft. Bowel sounds are normal. NT. No HSM  Musculoskeletal: Normal range of motion. Exhibits no edema.  Lymphadenopathy:  Has no cervical adenopathy.  Neurological: Pt is alert and oriented to person, place, and time. Pt has normal reflexes. No cranial nerve deficit. Motor grossly intact Skin: Skin is warm and dry. No rash noted.  Psychiatric:  Has normal mood and affect. Behavior is normal.      Assessment & Plan:

## 2015-03-27 NOTE — Assessment & Plan Note (Signed)
stable overall by history and exam, recent data reviewed with pt, and pt to continue medical treatment as before,  to f/u any worsening symptoms or concerns Lab Results  Component Value Date   LDLCALC 110* 09/29/2011    for fu lab

## 2015-03-27 NOTE — Assessment & Plan Note (Signed)
Minor in past, stable overall by history and exam, recent data reviewed with pt, and pt to continue medical treatment as before,  to f/u any worsening symptoms or concerns Lab Results  Component Value Date   HGBA1C 5.4 02/06/2015

## 2015-03-27 NOTE — Patient Instructions (Addendum)
Please continue all other medications as before, and refills have been done if requested.  Please have the pharmacy call with any other refills you may need.  Please continue your efforts at being more active, low cholesterol diet, and weight control.  You are otherwise up to date with prevention measures today.  Please keep your appointments with your specialists as you may have planned  You will be contacted regarding the referral for: mammogram  Please go to the LAB in the Basement (turn left off the elevator) for the tests to be done today  You will be contacted by phone if any changes need to be made immediately.  Otherwise, you will receive a letter about your results with an explanation, but please check with MyChart first.  Please remember to sign up for MyChart if you have not done so, as this will be important to you in the future with finding out test results, communicating by private email, and scheduling acute appointments online when needed.  Please return in 6 months, or sooner if needed

## 2015-03-27 NOTE — Assessment & Plan Note (Signed)
stable overall by history and exam, recent data reviewed with pt, and pt to continue medical treatment as before,  to f/u any worsening symptoms or concerns Lab Results  Component Value Date   WBC 7.2 10/21/2012   HGB 13.8 10/21/2012   HCT 41.2 10/21/2012   PLT 329.0 10/21/2012   GLUCOSE 86 02/06/2015   CHOL 211* 07/04/2013   TRIG 68.0 07/04/2013   HDL 68.90 07/04/2013   LDLDIRECT 128.5 07/04/2013   LDLCALC 110* 09/29/2011   ALT 18 07/04/2013   AST 17 07/04/2013   NA 136 02/06/2015   K 3.7 02/06/2015   CL 103 02/06/2015   CREATININE 0.84 02/06/2015   BUN 13 02/06/2015   CO2 27 02/06/2015   TSH 1.39 10/21/2012   INR 0.9 07/17/2009   HGBA1C 5.4 02/06/2015  '

## 2015-03-27 NOTE — Progress Notes (Signed)
Pre visit review using our clinic review tool, if applicable. No additional management support is needed unless otherwise documented below in the visit note. 

## 2015-03-28 ENCOUNTER — Other Ambulatory Visit: Payer: Self-pay | Admitting: Internal Medicine

## 2015-03-28 ENCOUNTER — Telehealth: Payer: Self-pay | Admitting: Internal Medicine

## 2015-03-28 DIAGNOSIS — Z129 Encounter for screening for malignant neoplasm, site unspecified: Secondary | ICD-10-CM

## 2015-03-28 DIAGNOSIS — Z1231 Encounter for screening mammogram for malignant neoplasm of breast: Secondary | ICD-10-CM

## 2015-03-28 DIAGNOSIS — Z1239 Encounter for other screening for malignant neoplasm of breast: Secondary | ICD-10-CM

## 2015-03-28 NOTE — Telephone Encounter (Signed)
Patient returning your call from yesterday. Please call this morning

## 2015-03-29 DIAGNOSIS — G243 Spasmodic torticollis: Secondary | ICD-10-CM | POA: Diagnosis not present

## 2015-03-29 DIAGNOSIS — Z5189 Encounter for other specified aftercare: Secondary | ICD-10-CM | POA: Diagnosis not present

## 2015-03-29 DIAGNOSIS — R293 Abnormal posture: Secondary | ICD-10-CM | POA: Diagnosis not present

## 2015-03-29 DIAGNOSIS — M791 Myalgia: Secondary | ICD-10-CM | POA: Diagnosis not present

## 2015-03-29 NOTE — Telephone Encounter (Signed)
Left message for pt. To call back. Not sure what she is calling about since I did not call her. Will wait for her response.

## 2015-03-29 NOTE — Telephone Encounter (Signed)
Pt rec'd an automated call, it wasn't from you.  So no need to call her back.

## 2015-04-06 DIAGNOSIS — G243 Spasmodic torticollis: Secondary | ICD-10-CM | POA: Diagnosis not present

## 2015-04-06 DIAGNOSIS — M791 Myalgia: Secondary | ICD-10-CM | POA: Diagnosis not present

## 2015-04-06 DIAGNOSIS — G248 Other dystonia: Secondary | ICD-10-CM | POA: Diagnosis not present

## 2015-04-06 DIAGNOSIS — R293 Abnormal posture: Secondary | ICD-10-CM | POA: Diagnosis not present

## 2015-04-16 DIAGNOSIS — I8392 Asymptomatic varicose veins of left lower extremity: Secondary | ICD-10-CM | POA: Diagnosis not present

## 2015-04-16 DIAGNOSIS — L570 Actinic keratosis: Secondary | ICD-10-CM | POA: Diagnosis not present

## 2015-04-17 DIAGNOSIS — G248 Other dystonia: Secondary | ICD-10-CM | POA: Diagnosis not present

## 2015-04-17 DIAGNOSIS — R293 Abnormal posture: Secondary | ICD-10-CM | POA: Diagnosis not present

## 2015-04-17 DIAGNOSIS — G243 Spasmodic torticollis: Secondary | ICD-10-CM | POA: Diagnosis not present

## 2015-04-17 DIAGNOSIS — M791 Myalgia: Secondary | ICD-10-CM | POA: Diagnosis not present

## 2015-04-19 ENCOUNTER — Ambulatory Visit
Admission: RE | Admit: 2015-04-19 | Discharge: 2015-04-19 | Disposition: A | Discharge status: Home / Self Care | Payer: Medicare Other | Source: Ambulatory Visit | Attending: Internal Medicine | Admitting: Internal Medicine

## 2015-04-19 DIAGNOSIS — Z1231 Encounter for screening mammogram for malignant neoplasm of breast: Secondary | ICD-10-CM | POA: Diagnosis not present

## 2015-04-24 ENCOUNTER — Other Ambulatory Visit: Payer: Self-pay | Admitting: Internal Medicine

## 2015-04-24 DIAGNOSIS — R928 Other abnormal and inconclusive findings on diagnostic imaging of breast: Secondary | ICD-10-CM

## 2015-04-25 ENCOUNTER — Other Ambulatory Visit: Payer: Self-pay | Admitting: Internal Medicine

## 2015-04-25 ENCOUNTER — Other Ambulatory Visit: Payer: Self-pay

## 2015-04-25 ENCOUNTER — Telehealth: Payer: Self-pay | Admitting: Internal Medicine

## 2015-04-25 DIAGNOSIS — R928 Other abnormal and inconclusive findings on diagnostic imaging of breast: Secondary | ICD-10-CM

## 2015-04-25 NOTE — Telephone Encounter (Signed)
i don recall seeing any paperwork yet  This may need to be re-addressed with pt and her mammogram provider

## 2015-04-25 NOTE — Telephone Encounter (Signed)
Patient had mammogram done and they need to redo the right breast, because they have nothing to compare it with. She has an appointment on Monday and they need his authorization for this procedure. She said the imaging center faxed over paperwork on this and she is making sure he got this so she can make this appointment on Monday. Please advise patient today asap.

## 2015-04-25 NOTE — Telephone Encounter (Signed)
Pt. Aware.

## 2015-04-27 ENCOUNTER — Ambulatory Visit (INDEPENDENT_AMBULATORY_CARE_PROVIDER_SITE_OTHER): Payer: Medicare Other | Admitting: Neurology

## 2015-04-27 ENCOUNTER — Ambulatory Visit: Payer: Medicare Other | Admitting: Neurology

## 2015-04-27 DIAGNOSIS — G243 Spasmodic torticollis: Secondary | ICD-10-CM

## 2015-04-27 DIAGNOSIS — M5481 Occipital neuralgia: Secondary | ICD-10-CM | POA: Diagnosis not present

## 2015-04-27 MED ORDER — ONABOTULINUMTOXINA 100 UNITS IJ SOLR
300.0000 [IU] | Freq: Once | INTRAMUSCULAR | Status: AC
  Administered 2015-04-27: 300 [IU] via INTRAMUSCULAR

## 2015-04-27 NOTE — Progress Notes (Signed)
[facility] was seen today in neurologic f/u regarding cervical dystonia.   She is accompanied by her husband today who supplements the history.   Her last set of injections was on 02/10/14.  She is really very happy with the injections.  She continues to report some dysphagia but insists that she doesn't want to change the injections or drop the dose.  No choking but she does have to avoid "crumbly" foods.  "I can finally look straight."  She does carry water with her everywhere.  Her husband states that he is actually much less worried than he was previously.  She does use klonopin to help with the discomfort and is still working with the therapy which helps as well.  01/26/15 update:  Pt presents for botox but asks about a different issue.  She has had several foot surgeries and now has paresthesias in the right foot.  She went back to the surgeon and was given neurontin 300 mg bid.  She only took it one time and it did seem to help but it may have made her a little groggy.  She wanted to ask me about the med before continuing to take it.  04/27/15 update:  Pt with her husband who supplements the history.  He states that about 4 days after our last Botox, she developed a pain and points to the left trapezius region.  Over the course of days this pain began to shoot up the left occiput and then would go to the vertex of the head and seemed to shoot to the right parietal region.  She called here and we started her on Zanaflex.  The patient stated that it made her feel weird (states that she had a vibration-like feeling in her legs and arms) and she stopped it.  She admits that stress seems to make it worse.  She also admits that she is under a significant amount of stress over the last couple of weeks because her mammogram was abnormal and she has to go back for further imaging.  I reviewed mammorgram report and looks like there were calcifications found in the right breast.  Botox seemed to wear off about 10  days ago.  No dysphagia this past time.     PREVIOUS MEDICATIONS: Clonazepam  ALLERGIES:  No Known Allergies  CURRENT MEDICATIONS:  Current Outpatient Prescriptions on File Prior to Visit  Medication Sig Dispense Refill  . aspirin 81 MG tablet Take 81 mg by mouth daily.      Marland Kitchen b complex vitamins tablet Take 1 tablet by mouth daily.    . cholecalciferol (VITAMIN D) 1000 UNITS tablet Take 1,000 Units by mouth daily.    . clonazePAM (KLONOPIN) 1 MG tablet Take 1 tablet (1 mg total) by mouth 2 (two) times daily as needed for anxiety. 60 tablet 5  . fluticasone (FLONASE) 50 MCG/ACT nasal spray Place 2 sprays into both nostrils daily. 16 g 5  . glucosamine-chondroitin 500-400 MG tablet Take 1 tablet by mouth 3 (three) times daily.    . hydrochlorothiazide (HYDRODIURIL) 25 MG tablet TAKE 1 TABLET DAILY AS NEEDED FOR SWELLING 90 tablet 3  . hydrochlorothiazide (HYDRODIURIL) 25 MG tablet Take 1 tablet (25 mg total) by mouth daily. 90 tablet 1  . Multiple Vitamin (MULTIVITAMIN) tablet Take 1 tablet by mouth daily.    . Probiotic Product (PROBIOTIC DAILY PO) Take by mouth.    . tretinoin (RETIN-A) 0.025 % cream Apply topically at bedtime. 45 g 0  .  Tretinoin, Facial Wrinkles, 0.05 % CREA Apply small amount to affected area at bedtime 60 g 1   No current facility-administered medications on file prior to visit.    PAST MEDICAL HISTORY:   Past Medical History  Diagnosis Date  . 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 ALLERGIC RHINITIS 01/24/2008  . ANEMIA-IRON DEFICIENCY 01/24/2008    pt denied  . ANXIETY 02/26/2009  . BACK PAIN 09/30/2010  . DIZZINESS 02/28/2010  . ELEVATED BLOOD PRESSURE WITHOUT DIAGNOSIS OF HYPERTENSION 02/26/2009  . FATIGUE 02/26/2009  . GERD 01/24/2008  . HYPERLIPIDEMIA 08/16/2008  . SHINGLES 11/06/2010  . TOE PAIN 09/30/2010  . VARICOSE VEINS, LOWER EXTREMITIES 08/16/2008  . 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    PAST SURGICAL HISTORY:   Past Surgical History   Procedure Laterality Date  . Tonsillectomy    . Ectopic pregnancy surgery      at 74 yo with salpingectomy  . S/p bilateral breasts implants  12/2007  . S/p lumbar surgury  july 2010  . Foot surgery      bunion, hammer toe    SOCIAL HISTORY:   History   Social History  . Marital Status: Single    Spouse Name: N/A  . Number of Children: 2  . Years of Education: N/A   Occupational History  . unemployed housewife    Social History Main Topics  . Smoking status: Never Smoker   . Smokeless tobacco: Never Used  . Alcohol Use: Yes     Comment: 1 glass wine/day  . Drug Use: No  . Sexual Activity:    Partners: Male   Other Topics Concern  . Not on file   Social History Narrative    FAMILY HISTORY:   Family Status  Relation Status Death Age  . Mother Deceased Apr 18, 2009   osteoporosis, enlarged heart  . Father Deceased     lung CA  . Brother Deceased     MI, asthma  . Brother Alive   . Sister Alive     2, healthy  . Child Alive     2, healthy    ROS:  A complete 10 system review of systems was obtained and was unremarkable apart from what is mentioned above.  PHYSICAL EXAMINATION:    VITALS:   There were no vitals filed for this visit.  GEN:  Normal appears female in no acute distress.  Appears stated age.   NEUROLOGICAL: Orientation:  The patient is alert and oriented x 3.   Cranial nerves: There is good facial symmetry. Extraocular muscles are intact and visual fields are full to confrontational testing. Speech is fluent and clear. Soft palate rises symmetrically and there is no tongue deviation. Hearing is intact to conversational tone. Tone: Tone is good in the upper and lower extremities Sensation: Sensation is intact to light touch throughout. Motor: Strength is 5/5 in the bilateral upper and lower extremities.   Gait and Station: The patient is able to ambulate without difficulty. Abnormal movements: The patient's head is still turned to the right.   She has sidebending to the left   IMPRESSION/PLAN:  1. severe cervical dystonia.  This is creating some neck discomfort.    -Continue botox  -continue PT 2.  Head pain  -I don't think at all related to last botox.  She is not at all injected at the point where she describes the headache (left trapezius) nor does she have any injections around the left posterior occiput.  I wonder if  she had some left occipital neuralgia, but it is difficult to tell at this time since she no longer has symptoms.  She felt that Zanaflex made her feel weird and discontinued it.  She wanted to proceed with Botox today.

## 2015-04-27 NOTE — Procedures (Signed)
[facility]   Procedure Note Botox  Attending: Dr. Wells Guiles Ashely Joshua  Preoperative Diagnosis(es): Cervical Dystonia  Result History  Onset of effect: 2 days Duration of Benefit: 10 days ago Adverse Effects: no dysphagia last time   Consent obtained from: The patient  Benefits discussed included, but were not limited to decreased muscle tightness, increased joint range of motion, and decreased pain.  Risk discussed included, but were not limited pain and discomfort, bleeding, bruising, excessive weakness, venous thrombosis, muscle atrophy and dysphagia.  A copy of the patient medication guide was given to the patient which explains the blackbox warning.  Informed consent was obtained  Patients identity and treatment sites confirmed yes.     Details of Procedure: Skin was cleaned with alcohol.  A 30 gauge, 1/2 inch needle was introduced to the target muscle (except splenius capitus, posterior approach, where 27 inch, 1 1/2 gauge needle was used).  Prior to injection, the needle plunger was aspirated to make sure the needle was not within a blood vessel.  There was no blood retrieved on aspiration.    Following is a summary of the muscles injected  And the amount of Botulinum toxin used:   Dilution 0.9% preservative free saline mixed with 100 u Botox type A to make 10 U per 0.1cc  Injections  Location Left  Right Units Number of sites        Sternocleidomastoid 60+30 0 90 1  Splenius Capitus, posterior approach 0 100 100 1  Splenius Capitus, lateral approach 0 60 60 1  Levator Scapulae 30  30 0  Trapezius            TOTAL UNITS:   280    Agent: Botulinum Type A ( Onobotulinum Toxin type A ). 3 vials of Botox were used, containing 100 units and freshly diluted with 1 mL of sterile, non-preserved saline    Total injected (Units): 280  Total wasted (Units): 20 units   Pt tolerated procedure well without complications.   Reinjection is anticipated in 3 months.

## 2015-04-30 ENCOUNTER — Ambulatory Visit
Admission: RE | Admit: 2015-04-30 | Discharge: 2015-04-30 | Disposition: A | Discharge status: Home / Self Care | Payer: Medicare Other | Source: Ambulatory Visit | Attending: Internal Medicine | Admitting: Internal Medicine

## 2015-04-30 ENCOUNTER — Other Ambulatory Visit: Payer: Self-pay | Admitting: Internal Medicine

## 2015-04-30 DIAGNOSIS — R928 Other abnormal and inconclusive findings on diagnostic imaging of breast: Secondary | ICD-10-CM

## 2015-04-30 DIAGNOSIS — R921 Mammographic calcification found on diagnostic imaging of breast: Secondary | ICD-10-CM

## 2015-05-01 ENCOUNTER — Ambulatory Visit
Admission: RE | Admit: 2015-05-01 | Discharge: 2015-05-01 | Disposition: A | Discharge status: Home / Self Care | Payer: Medicare Other | Source: Ambulatory Visit | Attending: Internal Medicine | Admitting: Internal Medicine

## 2015-05-01 ENCOUNTER — Other Ambulatory Visit: Payer: Self-pay | Admitting: Internal Medicine

## 2015-05-01 DIAGNOSIS — D0511 Intraductal carcinoma in situ of right breast: Secondary | ICD-10-CM | POA: Diagnosis not present

## 2015-05-01 DIAGNOSIS — R921 Mammographic calcification found on diagnostic imaging of breast: Secondary | ICD-10-CM

## 2015-05-01 DIAGNOSIS — R928 Other abnormal and inconclusive findings on diagnostic imaging of breast: Secondary | ICD-10-CM

## 2015-05-02 ENCOUNTER — Other Ambulatory Visit: Payer: Self-pay | Admitting: Internal Medicine

## 2015-05-02 DIAGNOSIS — D0511 Intraductal carcinoma in situ of right breast: Secondary | ICD-10-CM

## 2015-05-04 ENCOUNTER — Encounter: Payer: Self-pay | Admitting: *Deleted

## 2015-05-04 ENCOUNTER — Telehealth: Payer: Self-pay | Admitting: *Deleted

## 2015-05-04 DIAGNOSIS — C50111 Malignant neoplasm of central portion of right female breast: Secondary | ICD-10-CM | POA: Insufficient documentation

## 2015-05-04 DIAGNOSIS — C50119 Malignant neoplasm of central portion of unspecified female breast: Secondary | ICD-10-CM

## 2015-05-04 HISTORY — DX: Malignant neoplasm of central portion of unspecified female breast: C50.119

## 2015-05-04 NOTE — Telephone Encounter (Signed)
Confirmed [facility] for 05/09/15 at 0830 .  Instructions and contact information given.

## 2015-05-07 ENCOUNTER — Other Ambulatory Visit: Payer: Self-pay | Admitting: Internal Medicine

## 2015-05-08 ENCOUNTER — Ambulatory Visit
Admission: RE | Admit: 2015-05-08 | Discharge: 2015-05-08 | Disposition: A | Discharge status: Home / Self Care | Payer: Medicare Other | Source: Ambulatory Visit | Attending: Internal Medicine | Admitting: Internal Medicine

## 2015-05-08 DIAGNOSIS — D0511 Intraductal carcinoma in situ of right breast: Secondary | ICD-10-CM

## 2015-05-08 MED ORDER — GADOBENATE DIMEGLUMINE 529 MG/ML IV SOLN
15.0000 mL | Freq: Once | INTRAVENOUS | Status: AC | PRN
  Administered 2015-05-08: 15 mL via INTRAVENOUS

## 2015-05-09 ENCOUNTER — Encounter: Payer: Self-pay | Admitting: Skilled Nursing Facility1

## 2015-05-09 ENCOUNTER — Encounter: Payer: Self-pay | Admitting: Hematology and Oncology

## 2015-05-09 ENCOUNTER — Ambulatory Visit (HOSPITAL_BASED_OUTPATIENT_CLINIC_OR_DEPARTMENT_OTHER): Payer: Medicare Other | Admitting: Hematology and Oncology

## 2015-05-09 ENCOUNTER — Other Ambulatory Visit (HOSPITAL_BASED_OUTPATIENT_CLINIC_OR_DEPARTMENT_OTHER): Payer: Medicare Other

## 2015-05-09 ENCOUNTER — Ambulatory Visit: Payer: Medicare Other

## 2015-05-09 ENCOUNTER — Ambulatory Visit
Admission: RE | Admit: 2015-05-09 | Discharge: 2015-05-09 | Disposition: A | Discharge status: Home / Self Care | Payer: Medicare Other | Source: Ambulatory Visit | Attending: Radiation Oncology | Admitting: Radiation Oncology

## 2015-05-09 ENCOUNTER — Ambulatory Visit: Payer: Medicare Other | Admitting: Physical Therapy

## 2015-05-09 ENCOUNTER — Other Ambulatory Visit: Payer: Self-pay | Admitting: Surgery

## 2015-05-09 ENCOUNTER — Encounter: Payer: Self-pay | Admitting: *Deleted

## 2015-05-09 ENCOUNTER — Ambulatory Visit: Admission: RE | Admit: 2015-05-09 | Payer: Self-pay | Source: Ambulatory Visit | Admitting: Radiation Oncology

## 2015-05-09 VITALS — BP 143/69 | HR 60 | Temp 97.7°F | Resp 18 | Ht 63.0 in | Wt 169.6 lb

## 2015-05-09 DIAGNOSIS — C50111 Malignant neoplasm of central portion of right female breast: Secondary | ICD-10-CM

## 2015-05-09 DIAGNOSIS — Z9882 Breast implant status: Secondary | ICD-10-CM | POA: Diagnosis not present

## 2015-05-09 DIAGNOSIS — D0591 Unspecified type of carcinoma in situ of right breast: Secondary | ICD-10-CM | POA: Diagnosis not present

## 2015-05-09 DIAGNOSIS — D0511 Intraductal carcinoma in situ of right breast: Secondary | ICD-10-CM

## 2015-05-09 LAB — COMPREHENSIVE METABOLIC PANEL (CC13)
ALK PHOS: 60 U/L (ref 40–150)
ALT: 21 U/L (ref 0–55)
AST: 19 U/L (ref 5–34)
Albumin: 3.9 g/dL (ref 3.5–5.0)
Anion Gap: 9 mEq/L (ref 3–11)
BUN: 11.3 mg/dL (ref 7.0–26.0)
CALCIUM: 9 mg/dL (ref 8.4–10.4)
CO2: 25 meq/L (ref 22–29)
Chloride: 107 mEq/L (ref 98–109)
Creatinine: 0.8 mg/dL (ref 0.6–1.1)
EGFR: 77 mL/min/{1.73_m2} — ABNORMAL LOW (ref >90)
Glucose: 73 mg/dl (ref 70–140)
POTASSIUM: 4.3 meq/L (ref 3.5–5.1)
SODIUM: 141 meq/L (ref 136–145)
Total Bilirubin: 0.51 mg/dL (ref 0.20–1.20)
Total Protein: 7 g/dL (ref 6.4–8.3)

## 2015-05-09 LAB — CBC WITH DIFFERENTIAL/PLATELET
BASO%: 0.6 % (ref 0.0–2.0)
BASOS ABS: 0 10*3/uL (ref 0.0–0.1)
EOS ABS: 0.1 10*3/uL (ref 0.0–0.5)
EOS%: 1.7 % (ref 0.0–7.0)
HCT: 40.5 % (ref 34.8–46.6)
HGB: 13.5 g/dL (ref 11.6–15.9)
LYMPH#: 1.5 10*3/uL (ref 0.9–3.3)
LYMPH%: 22 % (ref 14.0–49.7)
MCH: 31.3 pg (ref 25.1–34.0)
MCHC: 33.3 g/dL (ref 31.5–36.0)
MCV: 94 fL (ref 79.5–101.0)
MONO#: 0.9 10*3/uL (ref 0.1–0.9)
MONO%: 12.8 % (ref 0.0–14.0)
NEUT#: 4.3 10*3/uL (ref 1.5–6.5)
NEUT%: 62.9 % (ref 38.4–76.8)
PLATELETS: 332 10*3/uL (ref 145–400)
RBC: 4.31 10*6/uL (ref 3.70–5.45)
RDW: 12.9 % (ref 11.2–14.5)
WBC: 6.9 10*3/uL (ref 3.9–10.3)

## 2015-05-09 NOTE — Assessment & Plan Note (Signed)
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 We reviewed the breast MRI which showed postbiopsy changes with a 3 cm area of additional enhancement into the nipple.  Recommendation: 1. Central lumpectomy versus mastectomy 2. Adjuvant radiation therapy depending on type of surgery 3. Adjuvant antiestrogen therapy  Return to clinic after surgery to discuss final pathology results and to come up with the treatment plan.

## 2015-05-09 NOTE — Progress Notes (Signed)
[name] Bell is a very pleasant 74 y.o. female from [place], [place] with newly diagnosed DCIS of the right breast.  Biopsy results revealed the tumor's prognostic profile is ER positive and PR positive.   She presents today with her husband and son to the [facility]) for treatment consideration and recommendations from the breast surgeon, radiation oncologist, and medical oncologist.     I briefly met with [name] Bell and her family during her [facility] visit today. We discussed the purpose of the Survivorship Clinic, which will include monitoring for recurrence, coordinating completion of age and gender-appropriate cancer screenings, promotion of overall wellness, as well as managing potential late/long-term side effects of anti-cancer treatments.    The treatment plan for [name] Bell will likely include surgery, radiation therapy, and anti-estrogen therapy.  As of today, the intent of treatment for [name] Bell is cure, therefore she will be eligible for the Survivorship Clinic upon her completion of treatment.  Her survivorship care plan (SCP) document will be drafted and updated throughout the course of her treatment trajectory. She will receive the SCP in an office visit with myself in the Survivorship Clinic once she has completed treatment.   [name] Bell was encouraged to ask questions and all questions were answered to her satisfaction.  She was given my business card and encouraged to contact me with any concerns regarding survivorship.  I look forward to participating in her care.   Mike Craze, NP [place] [phone]

## 2015-05-09 NOTE — Progress Notes (Signed)
Note created by Dr. Gudena during office visit. Copy to patient, original to scan. 

## 2015-05-09 NOTE — Progress Notes (Signed)
Clinical Social Work [place] Psychosocial Distress Screening [place]  Patient completed distress screening protocol and scored an 8 on the Psychosocial Distress Thermometer which indicates moderate distress. Clinical Social Worker met with patient and patients family in [facility] to assess for distress and other psychosocial needs. Patient stated she felt "ok" with her treatment plan and treatment team, but the "waiting" was difficult and anxiety producing. CSW and patient discussed common feeling and emotions when being diagnosed with cancer. CSW and patient discussed the importance of support during treatment, and CSW informed patient of the support team and support services at [facility].CSW provided contact information and encouraged patient to call with any questions or concerns  ONCBCN DISTRESS SCREENING 05/09/2015  Screening Type Initial Screening  Distress experienced in past week (1-10) 8  Emotional problem type Adjusting to illness  Information Concerns Type Lack of info about diagnosis;Lack of info about treatment;Lack of info about complementary therapy choices  Physician notified of physical symptoms Yes  Referral to clinical psychology No  Referral to clinical social work Yes  Referral to dietition No  Referral to financial advocate No  Referral to palliative care No   Johnnye Lana, MSW, LCSW, OSW-C Clinical Social Worker [place] [phone]

## 2015-05-09 NOTE — Progress Notes (Signed)
Radiation Oncology         [phone]) [phone] ________________________________  Initial outpatient Consultation - Date: 05/09/2015   Name: [name] Bell MRN: [id number]   DOB: [birth year]  REFERRING PHYSICIAN: Biagio Borg, MD  DIAGNOSIS AND STAGE: No matching staging information was found for the patient.  HISTORY OF PRESENT ILLNESS::[name] Bell is a 74 y.o. female who present for a screening mammogram. She has calcifications in the upper inner quaduant of right breast. She does have a history of saline breast implants in 2008 in [place]. The calcifications measured 2.2 cm and were biopsied. These showed a low grade ductal carcinoma in situ. This was ER and PR +. She had and MRI of the bilateral breasts. This showed an area of enhancement 2.2x2.2x3.0 cm extending into the nipple. No abnormal lymph node seen and the left breast was negative. The implants were intact. She presents today for my opinion of radiation for the management of her disease. There is bruising at the biopsy site. The patient is contemplating breast conservation. She is accompanied by her husband and son. They are from [place], [place].    PREVIOUS RADIATION THERAPY: No  Past medical, social and family history were reviewed in the electronic chart. Review of symptoms was reviewed in the electronic chart. Medications were reviewed in the electronic chart.  Gynecologic History  Age at first menstrual period? 15  Are you still having periods? No Approximate date of last period? 1999 Obstetric History:  How many children have you carried to term? 2 Your age at first live birth? 48  Pregnant now or trying to get pregnant? No  Have you used birth control pills or hormone shots for contraception? No Health Maintenance:  Have you ever had a colonoscopy? Yes If yes, date? 2008  Have you ever had a bone density? Yes If yes, date? +/- 2008  Date of your last PAP smear? 2008 Date of your FIRST mammogram? 1999    PHYSICAL EXAM:  There were no vitals filed for this visit.. . Pleasant female in no distress. Appears younger than stated age. No palpable cervical, supraclavicular or axillary adenopathy. Palpable bilateral implants. Bruising over the right breast around the nipple. No palpable abnormalities. Alerta nd orientedx3.   IMPRESSION: 74 y/o woman with Stage 0 DCIS of the Right Breast  PLAN: I spoke to the patient today regarding her diagnosis and options for treatment. We discussed the equivalence in terms of survival and local failure between mastectomy and breast conservation. We discussed the role of radiation in decreasing local failures in patients who undergo lumpectomy. We discussed the process of simulation and the placement tattoos. We discussed 5 weeks of treatment as an outpatient. We discussed the possibility of asymptomatic lung damage. We discussed the low likelihood of secondary malignancies. We discussed the possible side effects including but not limited to skin redness, fatigue, permanent skin darkening, and breast swelling. We discussed capsular contraction She met with surgery, medical oncology as well as a member of our patient family support team, a dietitian, our survivor navigator, and our physical therapist. She is interested in pursuing radiation in [place]. She will be referred to Dr. Orlene Erm postoperatively.  I spent 60 minutes  face to face with the patient and more than 50% of that time was spent in counseling and/or coordination of care.  This document serves as a record of services personally performed by Thea Silversmith, MD. It was created on her behalf by Darcus Austin, a trained medical scribe. The  creation of this record is based on the scribe's personal observations and the provider's statements to them. This document has been checked and approved by the attending provider.  ------------------------------------------------  Thea Silversmith, MD

## 2015-05-09 NOTE — Progress Notes (Signed)
Checked in new pt with no financial concerns prior to seeing the dr.  Pt has 2 insurances so financial assistance may not be needed but she has my card for any billing questions or concerns. °

## 2015-05-09 NOTE — Progress Notes (Signed)
[facility] NOTE  Patient Care Team: Biagio Borg, MD as PCP - General Alphonsa Overall, MD as Consulting Physician (General Surgery) Nicholas Lose, MD as Consulting Physician (Hematology and Oncology) Thea Silversmith, MD as Consulting Physician (Radiation Oncology) Mauro Kaufmann, RN as Registered Nurse Rockwell Germany, RN as Registered Nurse  CHIEF COMPLAINTS/PURPOSE OF CONSULTATION:  Newly diagnosed DCIS right breast  HISTORY OF PRESENTING ILLNESS:  [employer] 74 y.o. female is here because of recent diagnosis of right breast DCIS. Patient had a routine screening mammogram that revealed calcifications in the right breast she has a prior history of saline implants 2008. The calcifications extending into the subareolar region spanning 2.2 cm. Biopsy was performed which showed low-grade DCIS with calcifications grade 1 ER 90% PR 90% area MRI of the breast in yesterday showed postbiopsy hematoma with additional enhancement of the nipple extending to be 3 cm. She was presented this morning at the multidisciplinary tumor board and she is here today to discuss treatment plan. She is accompanied by her husband and her son.  I reviewed her records extensively and collaborated the history with the patient.  SUMMARY OF ONCOLOGIC HISTORY:   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   05/01/2015 Initial Diagnosis Right breast biopsy: DCIS grade 1, ER 90%, PR 90%   05/08/2015 Breast MRI Right breast postbiopsy changes within the subareolar portion extends to involve the nipple together with the enhancement and biopsy change 3 x 2.2 x 2 cm size; worrisome calcifications measure 2.2 cm    In terms of breast cancer risk profile:  She menarched at early age of 43 and went to menopause at age 45  She had 2 pregnancy, her first child was born at age 33  She has not received birth control pills.  She was exposed to fertility medications or hormone replacement therapy. Patient  takes Premarin She has no family history of Breast/GYN/GI cancer  MEDICAL HISTORY:  Past Medical History  Diagnosis Date  . 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 ALLERGIC RHINITIS 01/24/2008  . ANEMIA-IRON DEFICIENCY 01/24/2008    pt denied  . ANXIETY 02/26/2009  . BACK PAIN 09/30/2010  . DIZZINESS 02/28/2010  . ELEVATED BLOOD PRESSURE WITHOUT DIAGNOSIS OF HYPERTENSION 02/26/2009  . FATIGUE 02/26/2009  . GERD 01/24/2008  . HYPERLIPIDEMIA 08/16/2008  . SHINGLES 11/06/2010  . TOE PAIN 09/30/2010  . VARICOSE VEINS, LOWER EXTREMITIES 08/16/2008  . 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 Cancer of central portion of female breast 05/04/2015  . Breast cancer     SURGICAL HISTORY: Past Surgical History  Procedure Laterality Date  . Tonsillectomy    . Ectopic pregnancy surgery      at 74 yo with salpingectomy  . S/p bilateral breasts implants  12/2007  . S/p lumbar surgury  july 2010  . Foot surgery      bunion, hammer toe    SOCIAL HISTORY: History   Social History  . Marital Status: Single    Spouse Name: N/A  . Number of Children: 2  . Years of Education: N/A   Occupational History  . unemployed housewife    Social History Main Topics  . Smoking status: Never Smoker   . Smokeless tobacco: Never Used  . Alcohol Use: 3.6 oz/week    6 Glasses of wine per week     Comment: 1 glass wine/day  . Drug Use: No  . Sexual Activity:    Partners: Male   Other  Topics Concern  . Not on file   Social History Narrative    FAMILY HISTORY: Family History  Problem Relation Age of Onset  . Cancer Father     lung cancer, smoker    ALLERGIES:  has No Known Allergies.  MEDICATIONS:  Current Outpatient Prescriptions  Medication Sig Dispense Refill  . clonazePAM (KLONOPIN) 1 MG tablet Take 1 tablet (1 mg total) by mouth 2 (two) times daily as needed for anxiety. 60 tablet 5   No current facility-administered medications for this visit.    REVIEW OF SYSTEMS:    Constitutional: Denies fevers, chills or abnormal night sweats Eyes: Denies blurriness of vision, double vision or watery eyes Ears, nose, mouth, throat, and face: Denies mucositis or sore throat Respiratory: Denies cough, dyspnea or wheezes Cardiovascular: Denies palpitation, chest discomfort or lower extremity swelling Gastrointestinal:  Denies nausea, heartburn or change in bowel habits Skin: Denies abnormal skin rashes Lymphatics: Denies new lymphadenopathy or easy bruising Neurological:Denies numbness, tingling or new weaknesses Behavioral/Psych: Mood is stable, no new changes  Breast:  Denies any palpable lumps or discharge, soreness from recent biopsies All other systems were reviewed with the patient and are negative.  PHYSICAL EXAMINATION: ECOG PERFORMANCE STATUS: 1 - Symptomatic but completely ambulatory  Filed Vitals:   05/09/15 0858  BP: 143/69  Pulse: 60  Temp: 97.7 F (36.5 C)  Resp: 18   Filed Weights   05/09/15 0858  Weight: 169 lb 9.6 oz (76.93 kg)    GENERAL:alert, no distress and comfortable SKIN: skin color, texture, turgor are normal, no rashes or significant lesions EYES: normal, conjunctiva are pink and non-injected, sclera clear OROPHARYNX:no exudate, no erythema and lips, buccal mucosa, and tongue normal  NECK: supple, thyroid normal size, non-tender, without nodularity LYMPH:  no palpable lymphadenopathy in the cervical, axillary or inguinal LUNGS: clear to auscultation and percussion with normal breathing effort HEART: regular rate & rhythm and no murmurs and no lower extremity edema ABDOMEN:abdomen soft, non-tender and normal bowel sounds Musculoskeletal:no cyanosis of digits and no clubbing  PSYCH: alert & oriented x 3 with fluent speech NEURO: no focal motor/sensory deficits BREAST: No palpable nodules in breast. 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 We reviewed the breast MRI which showed postbiopsy changes with a 3 cm area of additional enhancement into the nipple.  Recommendation: 1. Central lumpectomy versus mastectomy 2. Adjuvant radiation therapy depending on type of surgery 3. Adjuvant antiestrogen therapy with tamoxifen 20 mg daily 5 years  Tamoxifen counseling:We discussed  the risks and benefits of tamoxifen. These include but not limited to insomnia, hot flashes, mood changes, vaginal dryness, and weight gain. Although rare, serious side effects including endometrial cancer, risk of blood clots were also discussed. We strongly believe that the benefits far outweigh the risks. Patient understands these risks and consented to starting treatment. Planned treatment duration is 5 years.  Return to clinic after surgery to discuss final pathology results and to come up with the treatment plan.  All questions were answered. The patient knows to call the clinic with any problems, questions or concerns.    Rulon Eisenmenger, MD 9:58 AM

## 2015-05-09 NOTE — Progress Notes (Signed)
Subjective:     Patient ID: [name] Bell, female   DOB: 1941-06-30, 74 y.o.   MRN: [id number]  HPI   Review of Systems     Objective:   Physical Exam For the patient to understand and be given the tools to implement a healthy plant based diet during their cancer diagnosis.     Assessment:     Patient was seen today and found to be in good spirits and accompanied by her seemingly supportive husband and son. Pts right breast afflicted. Pts GFR 77. Pts wt 169 pounds 5'3'' 30.1 BMI. Pt states she has trouble swallowing sometimes due to botox injections in her neck.      Plan:     Dietitian educated the patient on implementing a plant based diet by incorporating more plant proteins, fruits, and vegetables. As a part of a healthy routine physical activity was discussed. Dietitian described cholesterol and its relation to eggs and saturated fat. Dietitian offered some protein powder smoothie recipes on days when she is having trouble swallowing.  A folder of evidence based information with a focus on a plant based diet and general nutrition during cancer was given to the patient.  The importance of legitimate, evidence based information was discussed and examples were given. As a part of the continuum of care the cancer dietitian's contact information was given to the patient in the event they would like to have a follow up appointment.

## 2015-05-11 ENCOUNTER — Encounter (HOSPITAL_COMMUNITY): Payer: Self-pay | Admitting: *Deleted

## 2015-05-14 ENCOUNTER — Ambulatory Visit (HOSPITAL_COMMUNITY): Payer: Medicare Other | Admitting: Certified Registered Nurse Anesthetist

## 2015-05-14 ENCOUNTER — Encounter (HOSPITAL_COMMUNITY): Payer: Self-pay | Admitting: *Deleted

## 2015-05-14 ENCOUNTER — Encounter: Payer: Self-pay | Admitting: *Deleted

## 2015-05-14 ENCOUNTER — Telehealth: Payer: Self-pay | Admitting: Hematology and Oncology

## 2015-05-14 ENCOUNTER — Encounter (HOSPITAL_COMMUNITY)
Admission: RE | Disposition: A | Discharge status: Home / Self Care | Payer: Self-pay | Source: Ambulatory Visit | Attending: Surgery

## 2015-05-14 ENCOUNTER — Ambulatory Visit (HOSPITAL_COMMUNITY)
Admission: RE | Admit: 2015-05-14 | Discharge: 2015-05-14 | Disposition: A | Discharge status: Home / Self Care | Payer: Medicare Other | Source: Ambulatory Visit | Attending: Surgery | Admitting: Surgery

## 2015-05-14 DIAGNOSIS — C50911 Malignant neoplasm of unspecified site of right female breast: Secondary | ICD-10-CM | POA: Diagnosis not present

## 2015-05-14 DIAGNOSIS — F419 Anxiety disorder, unspecified: Secondary | ICD-10-CM | POA: Insufficient documentation

## 2015-05-14 DIAGNOSIS — D0511 Intraductal carcinoma in situ of right breast: Secondary | ICD-10-CM | POA: Diagnosis not present

## 2015-05-14 DIAGNOSIS — Z9882 Breast implant status: Secondary | ICD-10-CM | POA: Insufficient documentation

## 2015-05-14 DIAGNOSIS — G249 Dystonia, unspecified: Secondary | ICD-10-CM | POA: Diagnosis not present

## 2015-05-14 HISTORY — DX: Personal history of other medical treatment: Z92.89

## 2015-05-14 HISTORY — PX: BREAST LUMPECTOMY: SHX2

## 2015-05-14 LAB — BASIC METABOLIC PANEL
ANION GAP: 10 (ref 5–15)
BUN: 6 mg/dL (ref 6–20)
CHLORIDE: 100 mmol/L — AB (ref 101–111)
CO2: 25 mmol/L (ref 22–32)
Calcium: 8.9 mg/dL (ref 8.9–10.3)
Creatinine, Ser: 0.81 mg/dL (ref 0.44–1.00)
GFR calc non Af Amer: >60 mL/min (ref >60)
Glucose, Bld: 93 mg/dL (ref 65–99)
POTASSIUM: 3.8 mmol/L (ref 3.5–5.1)
SODIUM: 135 mmol/L (ref 135–145)

## 2015-05-14 LAB — CBC
HEMATOCRIT: 38.6 % (ref 36.0–46.0)
Hemoglobin: 13 g/dL (ref 12.0–15.0)
MCH: 31.1 pg (ref 26.0–34.0)
MCHC: 33.7 g/dL (ref 30.0–36.0)
MCV: 92.3 fL (ref 78.0–100.0)
Platelets: 316 10*3/uL (ref 150–400)
RBC: 4.18 MIL/uL (ref 3.87–5.11)
RDW: 12.7 % (ref 11.5–15.5)
WBC: 5.2 10*3/uL (ref 4.0–10.5)

## 2015-05-14 SURGERY — BREAST LUMPECTOMY
Anesthesia: General | Site: Breast | Laterality: Right

## 2015-05-14 MED ORDER — PROPOFOL 10 MG/ML IV BOLUS
INTRAVENOUS | Status: AC
  Filled 2015-05-14: qty 20

## 2015-05-14 MED ORDER — FENTANYL CITRATE (PF) 250 MCG/5ML IJ SOLN
INTRAMUSCULAR | Status: AC
  Filled 2015-05-14: qty 5

## 2015-05-14 MED ORDER — 0.9 % SODIUM CHLORIDE (POUR BTL) OPTIME
TOPICAL | Status: DC | PRN
  Administered 2015-05-14: 1000 mL

## 2015-05-14 MED ORDER — CHLORHEXIDINE GLUCONATE 4 % EX LIQD
1.0000 "application " | Freq: Once | CUTANEOUS | Status: DC
  Filled 2015-05-14: qty 15

## 2015-05-14 MED ORDER — SUCCINYLCHOLINE CHLORIDE 20 MG/ML IJ SOLN
INTRAMUSCULAR | Status: AC
  Filled 2015-05-14: qty 1

## 2015-05-14 MED ORDER — HYDROCODONE-ACETAMINOPHEN 5-325 MG PO TABS
1.0000 | ORAL_TABLET | Freq: Once | ORAL | Status: AC
  Administered 2015-05-14: 1 via ORAL

## 2015-05-14 MED ORDER — MIDAZOLAM HCL 2 MG/2ML IJ SOLN
INTRAMUSCULAR | Status: AC
  Filled 2015-05-14: qty 2

## 2015-05-14 MED ORDER — ONDANSETRON HCL 4 MG/2ML IJ SOLN: 4.0000 mg | Freq: Once | INTRAMUSCULAR | Status: DC | PRN

## 2015-05-14 MED ORDER — ARTIFICIAL TEARS OP OINT
TOPICAL_OINTMENT | OPHTHALMIC | Status: DC | PRN
  Administered 2015-05-14: 1 via OPHTHALMIC

## 2015-05-14 MED ORDER — LIDOCAINE HCL (CARDIAC) 20 MG/ML IV SOLN
INTRAVENOUS | Status: AC
  Filled 2015-05-14: qty 5

## 2015-05-14 MED ORDER — ONDANSETRON HCL 4 MG/2ML IJ SOLN
INTRAMUSCULAR | Status: DC | PRN
  Administered 2015-05-14: 4 mg via INTRAVENOUS

## 2015-05-14 MED ORDER — PROPOFOL 10 MG/ML IV BOLUS
INTRAVENOUS | Status: DC | PRN
  Administered 2015-05-14: 130 mg via INTRAVENOUS

## 2015-05-14 MED ORDER — HYDROCODONE-ACETAMINOPHEN 5-325 MG PO TABS: 1.0000 | ORAL_TABLET | Freq: Four times a day (QID) | ORAL | Status: DC | PRN

## 2015-05-14 MED ORDER — ONDANSETRON HCL 4 MG/2ML IJ SOLN
INTRAMUSCULAR | Status: AC
  Filled 2015-05-14: qty 2

## 2015-05-14 MED ORDER — BUPIVACAINE-EPINEPHRINE 0.25% -1:200000 IJ SOLN
INTRAMUSCULAR | Status: DC | PRN
  Administered 2015-05-14: 30 mL

## 2015-05-14 MED ORDER — LACTATED RINGERS IV SOLN
INTRAVENOUS | Status: DC
  Administered 2015-05-14: 15:00:00 via INTRAVENOUS
  Administered 2015-05-14: 50 mL/h via INTRAVENOUS

## 2015-05-14 MED ORDER — HYDROMORPHONE HCL 1 MG/ML IJ SOLN
INTRAMUSCULAR | Status: AC
  Filled 2015-05-14: qty 1

## 2015-05-14 MED ORDER — MIDAZOLAM HCL 5 MG/5ML IJ SOLN
INTRAMUSCULAR | Status: DC | PRN
  Administered 2015-05-14: 2 mg via INTRAVENOUS

## 2015-05-14 MED ORDER — CEFAZOLIN SODIUM-DEXTROSE 2-3 GM-% IV SOLR
INTRAVENOUS | Status: AC
  Filled 2015-05-14: qty 50

## 2015-05-14 MED ORDER — DEXAMETHASONE SODIUM PHOSPHATE 4 MG/ML IJ SOLN
INTRAMUSCULAR | Status: DC | PRN
  Administered 2015-05-14: 4 mg via INTRAVENOUS

## 2015-05-14 MED ORDER — BUPIVACAINE-EPINEPHRINE (PF) 0.25% -1:200000 IJ SOLN
INTRAMUSCULAR | Status: AC
Start: 2015-05-14 — End: 2015-05-14
  Filled 2015-05-14: qty 30

## 2015-05-14 MED ORDER — FENTANYL CITRATE (PF) 100 MCG/2ML IJ SOLN
25.0000 ug | INTRAMUSCULAR | Status: DC | PRN
  Administered 2015-05-14: 25 ug via INTRAVENOUS
  Administered 2015-05-14: 50 ug via INTRAVENOUS

## 2015-05-14 MED ORDER — FENTANYL CITRATE (PF) 100 MCG/2ML IJ SOLN
INTRAMUSCULAR | Status: DC
Start: 2015-05-14 — End: 2015-05-14
  Filled 2015-05-14: qty 2

## 2015-05-14 MED ORDER — LACTATED RINGERS IV SOLN
INTRAVENOUS | Status: DC | PRN
  Administered 2015-05-14: 15:00:00 via INTRAVENOUS

## 2015-05-14 MED ORDER — CEFAZOLIN SODIUM-DEXTROSE 2-3 GM-% IV SOLR
2.0000 g | INTRAVENOUS | Status: AC
  Administered 2015-05-14: 2 g via INTRAVENOUS

## 2015-05-14 MED ORDER — FENTANYL CITRATE (PF) 100 MCG/2ML IJ SOLN
INTRAMUSCULAR | Status: DC | PRN
  Administered 2015-05-14 (×2): 50 ug via INTRAVENOUS

## 2015-05-14 MED ORDER — LIDOCAINE HCL 1 % IJ SOLN
INTRAMUSCULAR | Status: DC | PRN
  Administered 2015-05-14: 60 mg via INTRADERMAL

## 2015-05-14 MED ORDER — HYDROCODONE-ACETAMINOPHEN 5-325 MG PO TABS
ORAL_TABLET | ORAL | Status: AC
  Filled 2015-05-14: qty 1

## 2015-05-14 MED ORDER — ROCURONIUM BROMIDE 50 MG/5ML IV SOLN
INTRAVENOUS | Status: AC
  Filled 2015-05-14: qty 1

## 2015-05-14 MED ORDER — BUPIVACAINE-EPINEPHRINE (PF) 0.25% -1:200000 IJ SOLN
INTRAMUSCULAR | Status: AC
  Filled 2015-05-14: qty 30

## 2015-05-14 MED ORDER — HYDROMORPHONE HCL 1 MG/ML IJ SOLN
0.5000 mg | INTRAMUSCULAR | Status: AC | PRN
  Administered 2015-05-14 (×2): 0.5 mg via INTRAVENOUS

## 2015-05-14 SURGICAL SUPPLY — 39 items
APPLIER CLIP 9.375 SM OPEN (CLIP) ×2
BINDER BREAST LRG (GAUZE/BANDAGES/DRESSINGS) IMPLANT
BINDER BREAST XLRG (GAUZE/BANDAGES/DRESSINGS) ×2 IMPLANT
CANISTER SUCTION 2500CC (MISCELLANEOUS) ×2 IMPLANT
CHLORAPREP W/TINT 26ML (MISCELLANEOUS) ×2 IMPLANT
CLIP APPLIE 9.375 SM OPEN (CLIP) ×1 IMPLANT
CLIP TI WIDE RED SMALL 6 (CLIP) ×2 IMPLANT
COVER SURGICAL LIGHT HANDLE (MISCELLANEOUS) ×2 IMPLANT
DRAPE CHEST BREAST 15X10 FENES (DRAPES) ×2 IMPLANT
DRAPE PROXIMA HALF (DRAPES) ×2 IMPLANT
DRAPE UTILITY XL STRL (DRAPES) IMPLANT
DRSG PAD ABDOMINAL 8X10 ST (GAUZE/BANDAGES/DRESSINGS) ×2 IMPLANT
ELECT COATED BLADE 2.86 ST (ELECTRODE) ×2 IMPLANT
ELECT REM PT RETURN 9FT ADLT (ELECTROSURGICAL) ×2
ELECTRODE REM PT RTRN 9FT ADLT (ELECTROSURGICAL) ×1 IMPLANT
GAUZE SPONGE 4X4 12PLY STRL (GAUZE/BANDAGES/DRESSINGS) ×2 IMPLANT
GLOVE BIOGEL PI IND STRL 7.0 (GLOVE) ×1 IMPLANT
GLOVE BIOGEL PI INDICATOR 7.0 (GLOVE) ×1
GLOVE SURG SIGNA 7.5 PF LTX (GLOVE) ×4 IMPLANT
GLOVE SURG SS PI 7.0 STRL IVOR (GLOVE) ×2 IMPLANT
GLOVE SURG SS PI 7.5 STRL IVOR (GLOVE) ×2 IMPLANT
GOWN STRL REUS W/ TWL LRG LVL3 (GOWN DISPOSABLE) ×1 IMPLANT
GOWN STRL REUS W/ TWL XL LVL3 (GOWN DISPOSABLE) ×1 IMPLANT
GOWN STRL REUS W/TWL LRG LVL3 (GOWN DISPOSABLE) ×1
GOWN STRL REUS W/TWL XL LVL3 (GOWN DISPOSABLE) ×1
KIT BASIN OR (CUSTOM PROCEDURE TRAY) ×2 IMPLANT
KIT MARKER MARGIN INK (KITS) ×2 IMPLANT
KIT ROOM TURNOVER OR (KITS) ×2 IMPLANT
LIQUID BAND (GAUZE/BANDAGES/DRESSINGS) ×2 IMPLANT
NEEDLE HYPO 25GX1X1/2 BEV (NEEDLE) ×2 IMPLANT
NS IRRIG 1000ML POUR BTL (IV SOLUTION) ×2 IMPLANT
PACK GENERAL/GYN (CUSTOM PROCEDURE TRAY) ×2 IMPLANT
PAD ARMBOARD 7.5X6 YLW CONV (MISCELLANEOUS) ×2 IMPLANT
SUT MNCRL AB 4-0 PS2 18 (SUTURE) ×2 IMPLANT
SUT MON AB 5-0 PS2 18 (SUTURE) ×2 IMPLANT
SUT VIC AB 3-0 SH 18 (SUTURE) ×2 IMPLANT
SYR CONTROL 10ML LL (SYRINGE) ×2 IMPLANT
TOWEL OR 17X24 6PK STRL BLUE (TOWEL DISPOSABLE) IMPLANT
TOWEL OR 17X26 10 PK STRL BLUE (TOWEL DISPOSABLE) ×2 IMPLANT

## 2015-05-14 NOTE — Telephone Encounter (Signed)
Called and left a message with follow up appointment °

## 2015-05-14 NOTE — Anesthesia Preprocedure Evaluation (Signed)
Anesthesia Evaluation  Patient identified by MRN, date of birth, ID band Patient awake    Reviewed: Allergy & Precautions, NPO status , Patient's Chart, lab work & pertinent test results  Airway Mallampati: II  TM Distance: >3 FB Neck ROM: Full    Dental  (+) Teeth Intact, Dental Advisory Given   Pulmonary  breath sounds clear to auscultation        Cardiovascular Rhythm:Regular Rate:Normal     Neuro/Psych    GI/Hepatic   Endo/Other    Renal/GU      Musculoskeletal   Abdominal (+) + obese,   Peds  Hematology   Anesthesia Other Findings   Reproductive/Obstetrics                             Anesthesia Physical Anesthesia Plan  ASA: II  Anesthesia Plan: General   Post-op Pain Management:    Induction: Intravenous  Airway Management Planned: LMA  Additional Equipment:   Intra-op Plan:   Post-operative Plan:   Informed Consent: I have reviewed the patients History and Physical, chart, labs and discussed the procedure including the risks, benefits and alternatives for the proposed anesthesia with the patient or authorized representative who has indicated his/her understanding and acceptance.   Dental advisory given  Plan Discussed with: CRNA and Anesthesiologist  Anesthesia Plan Comments:         Anesthesia Quick Evaluation

## 2015-05-14 NOTE — Discharge Instructions (Signed)
[facility] - DISCHARGE INSTRUCTIONS TO PATIENT  Activity:  Driving - May drive in 1 to 3 days, if doing well   Lifting - Take it easy for 5 days, then no limit  Wound Care:   Leave bandage for 2 days, then remove and shower.  Diet:  As tolerated  Follow up appointment:  Call Dr. Pollie Friar office [facility]) at [phone] for an appointment in 2 weeks.  Medications and dosages:  Resume your home medications.  You have a prescription for:  Vicodin  Call Dr. Lucia Gaskins or his office  [phone]) if you have:  Temperature greater than 100.4,  Persistent nausea and vomiting,  Severe uncontrolled pain,  Redness, tenderness, or signs of infection (pain, swelling, redness, odor or green/yellow discharge around the site),  Difficulty breathing, headache or visual disturbances,  Any other questions or concerns you may have after discharge.  In an emergency, call 911 or go to an Emergency Department at a nearby hospital.

## 2015-05-14 NOTE — Anesthesia Postprocedure Evaluation (Signed)
  Anesthesia Post-op Note  Patient: [employer] [facility]  Procedure(s) Performed: Procedure(s): RIGHT BREAST LUMPECTOMY (Right)  Patient Location: PACU  Anesthesia Type:General  Level of Consciousness: awake, alert  and oriented  Airway and Oxygen Therapy: Patient Spontanous Breathing and Patient connected to nasal cannula oxygen  Post-op Pain: mild  Post-op Assessment: Post-op Vital signs reviewed, Patient's Cardiovascular Status Stable, Respiratory Function Stable, Patent Airway and Pain level controlled  Post-op Vital Signs: stable  Last Vitals:  Filed Vitals:   05/14/15 1815  BP: 140/84  Pulse: 68  Temp:   Resp: 14    Complications: No apparent anesthesia complications

## 2015-05-14 NOTE — Progress Notes (Signed)
Patient notified of time delay

## 2015-05-14 NOTE — Transfer of Care (Signed)
Immediate Anesthesia Transfer of Care Note  Patient: [name] Bell  Procedure(s) Performed: Procedure(s): RIGHT BREAST LUMPECTOMY (Right)  Patient Location: PACU  Anesthesia Type:General  Level of Consciousness: awake, alert , oriented and patient cooperative  Airway & Oxygen Therapy: Patient Spontanous Breathing and Patient connected to nasal cannula oxygen  Post-op Assessment: Report given to RN, Post -op Vital signs reviewed and stable and Patient moving all extremities X 4  Post vital signs: Reviewed and stable  Last Vitals:  Filed Vitals:   05/14/15 1134  BP: 149/57  Pulse: 59  Temp: 36.6 C  Resp: 18    Complications: No apparent anesthesia complications

## 2015-05-14 NOTE — Anesthesia Procedure Notes (Signed)
Procedure Name: LMA Insertion Date/Time: 05/14/2015 3:14 PM Performed by: Willeen Cass P Pre-anesthesia Checklist: Patient identified, Timeout performed, Emergency Drugs available, Suction available and Patient being monitored Patient Re-evaluated:Patient Re-evaluated prior to inductionOxygen Delivery Method: Circle system utilized Preoxygenation: Pre-oxygenation with 100% oxygen Intubation Type: IV induction Ventilation: Mask ventilation without difficulty LMA: LMA inserted LMA Size: 4.0 Number of attempts: 1 Placement Confirmation: breath sounds checked- equal and bilateral and positive ETCO2 Tube secured with: Tape Dental Injury: Teeth and Oropharynx as per pre-operative assessment  Comments: Dr Linna Caprice present for induction.

## 2015-05-14 NOTE — H&P (Signed)
[name] Bell 05/09/2015 10:13 AM Location: [facility] Patient #: [id number] DOB: [birth year] Undefined / Language: Undefined / Race: Undefined Female  History of Present Illness  The patient is a 74 year old female who presents with breast cancer.  Her PCP is Dr. Marshall Bell. Her neurologist is Dr. Alfonso Bell. Tat. She is at the [facility] - she is seeing Drs. [name] Bell. She is her with her husband, [name] [facility], and son, [name] Bell.  She has not had a mammogram in years. [name] Bell was after her to get a mammogram - he made an appt for her and she finally got one done. She had a breast augmentation in 2008 in [place]. She has no other breast history. She has no fam hx of breast cancer. Her last period was around 1995. She is not on hormones.  She had a mammogram on 04/19/2015 at [name] which showed calcifications in the right breast. She had a detailed right mammogram on 04/30/2015 which showed 2.2 cm group of slightly suspicious calcifications located within the superior subareolar portion of the right breast.  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 This measured 2.2 on mammogram by Ca++ and 3.0 on MRI and goes to the nipple.  Breast MRI - 05/08/2015 - Enhancement went post biopsy change located within the subareolar portion of the right breast in the area of the known DCIS via stereotactic biopsy. This enhancement extends to involve the nipple. The area of post biopsy change and enhancement measures 3.0 x 2.2 x 2.0 cm size. The area worrisome calcification by mammography measured 2.2 cm in size.  I discussed the options for breast cancer treatment with the patient. I discussed the surgical options of lumpectomy vs. mastectomy. If mastectomy, there is the possibility of reconstruction. I discussed the options of lymph node biopsy. The treatment plan depends on the pathologic staging of the tumor and the  patient's personal wishes. The risks of surgery include, but are not limited to, bleeding, infection, the need for further surgery, and nerve injury. I think that she is a candidate for a central lumpectomy. She does not need a SLNBx. Follow up with rad tx and anti estrogen.  Past Medical History: 1. Cervical dystonia x 5 years - sees Dr. Carles Bell Treated with Botox with success for 2 years 2. Anxiety 3. Bilateral breast implants  (by operative notes, these seem to be subglandular implants) Dr. Sylvan Cheese [facility] Work: [phone] FAX: [phone] 4. Back surgery - [name] - 2011, this has gone well 5. Right foot surgery x 2 - last in [place]. Hammer toe and bunion, this still bothers her. 6. Colonoscopy - 2007 [name]  Social History: Married. Her husband is with her. They have two sons: [name] Bell (42), who is with her and lives in [place], and [name] Bell (43), who lives in [place]. Works as a Agricultural engineer. They are originally from [place]. She lost her first husband to colon cancer   Other Problems [name] Bell, [place]; 05/09/2015 10:13 AM) Anxiety Disorder Back Pain Breast Cancer Lump In Breast Transfusion history  Past Surgical History [name], Bell; 05/09/2015 10:13 AM) Breast Augmentation Bilateral. Breast Biopsy Right. Colon Polyp Removal - Colonoscopy Foot Surgery Right. Oral Surgery Spinal Surgery - Lower Back Tonsillectomy  Diagnostic Studies History [name], [place]; 05/09/2015 10:13 AM) Colonoscopy 5-10 years ago Mammogram within last year Pap Smear >5 years ago  Social History [name], [place]; 05/09/2015 10:13  AM) Alcohol use Moderate alcohol use. Caffeine use Coffee. No drug use Tobacco use Never smoker.  Family History [name] Bell, [place]; 05/09/2015 10:13 AM) Respiratory Condition Father.  Pregnancy / Birth History [name] Bell, [place]; 05/09/2015 10:13  AM) Age at menarche 83 years. Age of menopause 38-55 Gravida 3 Irregular periods Maternal age 59-30 Para 2  Review of Systems [name] Bell; 05/09/2015 10:13 AM) General Present- Appetite Loss. Not Present- Chills, Fatigue, Fever, Night Sweats, Weight Gain and Weight Loss. Skin Not Present- Change in Wart/Mole, Dryness, Hives, Jaundice, New Lesions, Non-Healing Wounds, Rash and Ulcer. HEENT Present- Wears glasses/contact lenses. Not Present- Earache, Hearing Loss, Hoarseness, Nose Bleed, Oral Ulcers, Ringing in the Ears, Seasonal Allergies, Sinus Pain, Sore Throat, Visual Disturbances and Yellow Eyes. Respiratory Present- Snoring. Not Present- Bloody sputum, Chronic Cough, Difficulty Breathing and Wheezing. Breast Present- Breast Pain and Skin Changes. Not Present- Breast Mass and Nipple Discharge. Cardiovascular Not Present- Chest Pain, Difficulty Breathing Lying Down, Leg Cramps, Palpitations, Rapid Heart Rate, Shortness of Breath and Swelling of Extremities. Gastrointestinal Present- Difficulty Swallowing. Not Present- Abdominal Pain, Bloating, Bloody Stool, Change in Bowel Habits, Chronic diarrhea, Constipation, Excessive gas, Gets full quickly at meals, Hemorrhoids, Indigestion, Nausea, Rectal Pain and Vomiting. Female Genitourinary Not Present- Frequency, Nocturia, Painful Urination, Pelvic Pain and Urgency. Musculoskeletal Not Present- Back Pain, Joint Pain, Joint Stiffness, Muscle Pain, Muscle Weakness and Swelling of Extremities. Psychiatric Present- Anxiety and Fearful. Not Present- Bipolar, Change in Sleep Pattern, Depression and Frequent crying. Endocrine Not Present- Cold Intolerance, Excessive Hunger, Hair Changes, Heat Intolerance, Hot flashes and New Diabetes. Hematology Not Present- Easy Bruising, Excessive bleeding, Gland problems, HIV and Persistent Infections.  Physical Exam  The physical exam findings are as follows: Note:General: WN older WF alert and  generally healthy appearing. HEENT: Normal. Pupils equal.  Neck: Supple. No mass. No thyroid mass. She turns her head gently to the right. Lymph Nodes: No supraclavicular or cervical or axillary nodes.  Lungs: Clear to auscultation and symmetric breath sounds. Heart: RRR. No murmur or rub.  Breasts: right - bruise at 8 o'clock up to the nipple. I see no changes in the nipple, such as Paget's. I don't feel a mass effect. Inframammary scar from implant. Left - Inframammary scar from implant.  Abdomen: Soft. No mass. No tenderness. No hernia. Normal bowel sounds. Pfannenstiel incision, healed.  Extremities: Good strength and ROM in upper and lower extremities.  Neurologic: Grossly intact to motor and sensory function. Psychiatric: Has normal mood and affect. Behavior is normal.  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   Sees Dr. Lindi Adie and Pablo Ledger - presented at [facility]  Current Plans   Schedule for Surgery  2.  HISTORY OF BILATERAL BREAST IMPLANTS (V43.82  Z98.82) 3.  Cervical dystonia 4.   Continued right foot issues after surgery.  Alphonsa Overall, MD, [facility] Pager: [phone] Office phone:  [phone]

## 2015-05-16 ENCOUNTER — Encounter (HOSPITAL_COMMUNITY): Payer: Self-pay | Admitting: Surgery

## 2015-05-18 ENCOUNTER — Telehealth: Payer: Self-pay | Admitting: *Deleted

## 2015-05-18 NOTE — Telephone Encounter (Signed)
Spoke to pt concerning [facility] from 05/09/15. Denies questions or concerns regarding dx or treatment care plan. Recovering well from surgery. Still a "little sore". Confirmed f/u appt with Dr. Lindi Adie. Encourage pt to call with needs. Received verbal understanding.

## 2015-05-21 NOTE — Op Note (Signed)
05/14/2015  5:27 PM  PATIENT:  [name] Bell DOB: [birth year] MRN: [id number]  PREOP DIAGNOSIS:  right breast cancer  POSTOP DIAGNOSIS:   Right breast cancer (DCIS), behind the nipple (Tis, N0)  PROCEDURE:   Procedure(s):  RIGHT BREAST LUMPECTOMY  SURGEON:   Alphonsa Overall, M.D.  ANESTHESIA:   general  Anesthesiologist: Albertha Ghee, MD; Roberts Gaudy, MD CRNA: Lowella Dell, CRNA  General  EBL:  miniml  ml  DRAINS: none   LOCAL MEDICATIONS USED:   30 cc 1/4% marcaine  SPECIMEN:   Right breast lumpectomy  COUNTS CORRECT:  YES  INDICATIONS FOR PROCEDURE:  ILEE RANDLEMAN is a 74 y.o. (DOB: Nov 10, 1941) white  female whose primary care physician is Cathlean Cower, MD and comes for right breast lumpectomy.      She was at [facility] - she saw Drs. Christel Mormon.   The options for breast cancer treatment have been discussed with the patient. She elected to proceed with lumpectomy and axillary sentinel lymph node.     The indications and potential complications of surgery were explained to the patient. Potential complications include, but are not limited to, bleeding, infection, the need for further surgery, and nerve injury.     I did not need a needle localization because this was retroareolar and I use that as a landmark.  OPERATIVE NOTE:   The patient was taken to room # 8 at [facility] where she underwent a general anesthesia  supervised by Anesthesiologist: Albertha Ghee, MD; Roberts Gaudy, MD CRNA: Lowella Dell, CRNA. Her right breast and axilla were prepped with  ChloraPrep and sterilely draped.    A time-out and the surgical check list was reviewed.    I turned attention to the cancer which was behind the nipple on the right breast.    I tried to excise an area around the tumor of at least 1 cm.   The tumor seemed to go a little cranial on her imaging.   I excised this block of breast tissue approximately 3 cm by 4 cm  in diameter.   I painted the  lumpectomy specimen with the 6 color paint kit.  I took the specimen down to the pectoralis fascia.  Note, she has implants which are sub pectoral.  I did not shoot a specimen mammogram because I thought that I had the abnormal area and the clip was actually not exactly in the correct location.   I then irrigated the wound with saline. I infiltrated approximately 30 mL of 1/4 % local between the incisions.  I placed 6 clips to mark biopsy cavity, at 12, 3, 6, and 9 o'clock. Two clips were placed on the pectoralis major.   I then closed all the wounds in layers using 3-0 Vicryl sutures for the deep layer. At the skin, I closed the incisions with a 5-0 Monocryl suture. The incisions were then painted with LiquiBand.  She had gauze place over the wounds and placed in a breast binder.   The patient tolerated the procedure well, was transported to the recovery room in good condition. Sponge and needle count were correct at the end of the case.   Final pathology is pending.   Alphonsa Overall, MD, [facility] Pager: [phone] Office phone:  [phone]

## 2015-05-24 ENCOUNTER — Encounter: Payer: Self-pay | Admitting: *Deleted

## 2015-05-24 NOTE — Progress Notes (Signed)
Pt scheduled to see Dr. Orlene Erm for xrt on 05/30/15 at 1:00PM

## 2015-05-28 NOTE — Assessment & Plan Note (Signed)
Rt Lumpectomy 05/14/15 : 3.6 cm Low grade DCIS Margins Neg, Er 90%, PR 90% Pathology Review: Discussed the path report and provided a copy of the report.  Plan: 1. Adjuvant radiation therapy depending on type of surgery 2. Adjuvant antiestrogen therapy with tamoxifen 20 mg daily 5 years

## 2015-05-29 ENCOUNTER — Ambulatory Visit (HOSPITAL_BASED_OUTPATIENT_CLINIC_OR_DEPARTMENT_OTHER): Payer: Medicare Other | Admitting: Hematology and Oncology

## 2015-05-29 ENCOUNTER — Telehealth: Payer: Self-pay | Admitting: Hematology and Oncology

## 2015-05-29 ENCOUNTER — Encounter: Payer: Self-pay | Admitting: *Deleted

## 2015-05-29 VITALS — BP 157/79 | HR 68 | Temp 97.8°F | Resp 18 | Ht 63.0 in | Wt 171.1 lb

## 2015-05-29 DIAGNOSIS — Z170 Estrogen receptor positive status [ER+]: Secondary | ICD-10-CM

## 2015-05-29 DIAGNOSIS — D0511 Intraductal carcinoma in situ of right breast: Secondary | ICD-10-CM

## 2015-05-29 DIAGNOSIS — C50111 Malignant neoplasm of central portion of right female breast: Secondary | ICD-10-CM

## 2015-05-29 DIAGNOSIS — L03032 Cellulitis of left toe: Secondary | ICD-10-CM

## 2015-05-29 MED ORDER — CEPHALEXIN 500 MG PO CAPS: 500.0000 mg | ORAL_CAPSULE | Freq: Two times a day (BID) | ORAL | Status: DC

## 2015-05-29 NOTE — Progress Notes (Addendum)
Patient Care Team: Biagio Borg, MD as PCP - General Alphonsa Overall, MD as Consulting Physician (General Surgery) Nicholas Lose, MD as Consulting Physician (Hematology and Oncology) Thea Silversmith, MD as Consulting Physician (Radiation Oncology) Mauro Kaufmann, RN as Registered Nurse Rockwell Germany, RN as Registered Nurse Holley Bouche, NP as Nurse Practitioner (Nurse Practitioner)  DIAGNOSIS: 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   Staging form: Breast, AJCC 7th Edition     Clinical stage from 05/09/2015: Stage 0 (Tis (DCIS), N0, M0) - Unsigned       Staging comments: Staged at breast conference 5.11.16    SUMMARY OF ONCOLOGIC HISTORY:   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   05/01/2015 Initial Diagnosis Right breast biopsy: DCIS grade 1, ER 90%, PR 90%   05/08/2015 Breast MRI Right breast postbiopsy changes within the subareolar portion extends to involve the nipple together with the enhancement and biopsy change 3 x 2.2 x 2 cm size; worrisome calcifications measure 2.2 cm   05/14/2015 Surgery Rt Lumpectomy: 3.6 cm Low grade DCIS Margins Neg, Er 90%, PR 90%    CHIEF COMPLIANT: Follow-up of right breast DCIS  INTERVAL HISTORY: [name] Bell is a 74 year old with above-mentioned history of right breast DCIS who underwent lumpectomy and is here today to discuss the risks benefits of doing adjuvant hormonal therapy. She is healed very well from surgery.  REVIEW OF SYSTEMS:   Constitutional: Denies fevers, chills or abnormal weight loss Eyes: Denies blurriness of vision Ears, nose, mouth, throat, and face: Denies mucositis or sore throat Respiratory: Denies cough, dyspnea or wheezes Cardiovascular: Denies palpitation, chest discomfort or lower extremity swelling Gastrointestinal:  Denies nausea, heartburn or change in bowel habits Skin: Denies abnormal skin rashes Lymphatics: Denies new lymphadenopathy or easy bruising Neurological:Denies numbness, tingling or  new weaknesses Behavioral/Psych: Mood is stable, no new changes  Breast:  denies any pain or lumps or nodules in either breasts All other systems were reviewed with the patient and are negative.  I have reviewed the past medical history, past surgical history, social history and family history with the patient and they are unchanged from previous note.  ALLERGIES:  has No Known Allergies.  MEDICATIONS:  Current Outpatient Prescriptions  Medication Sig Dispense Refill  . clonazePAM (KLONOPIN) 1 MG tablet Take 1 tablet (1 mg total) by mouth 2 (two) times daily as needed for anxiety. 60 tablet 5  . Coenzyme Q10 (CO Q 10 PO) Take 1 capsule by mouth daily.    . fluticasone (FLONASE) 50 MCG/ACT nasal spray Place 1 spray into both nostrils 2 (two) times daily.    . Glucosamine HCl (GLUCOSAMINE PO) Take 1 tablet by mouth daily.    . hydrochlorothiazide (HYDRODIURIL) 25 MG tablet Take 25 mg by mouth daily as needed (for fluid).    Marland Kitchen HYDROcodone-acetaminophen (NORCO/VICODIN) 5-325 MG per tablet Take 1-2 tablets by mouth every 6 (six) hours as needed. 30 tablet 0  . Multiple Vitamins-Minerals (HAIR/SKIN/NAILS PO) Take 1 tablet by mouth daily.    . vitamin B-12 (CYANOCOBALAMIN) 500 MCG tablet Take 500 mcg by mouth daily.     No current facility-administered medications for this visit.    PHYSICAL EXAMINATION: ECOG PERFORMANCE STATUS: 0 - Asymptomatic  Filed Vitals:   05/29/15 1542  BP: 157/79  Pulse: 68  Temp: 97.8 F (36.6 C)  Resp: 18   Filed Weights   05/29/15 1542  Weight: 171 lb 1.6 oz (77.61 kg)    GENERAL:alert,  no distress and comfortable SKIN: skin color, texture, turgor are normal, no rashes or significant lesions EYES: normal, Conjunctiva are pink and non-injected, sclera clear OROPHARYNX:no exudate, no erythema and lips, buccal mucosa, and tongue normal  NECK: supple, thyroid normal size, non-tender, without nodularity LYMPH:  no palpable lymphadenopathy in the cervical,  axillary or inguinal LUNGS: clear to auscultation and percussion with normal breathing effort HEART: regular rate & rhythm and no murmurs and no lower extremity edema ABDOMEN:abdomen soft, non-tender and normal bowel sounds Musculoskeletal:no cyanosis of digits and no clubbing  NEURO: alert & oriented x 3 with fluent speech, no focal motor/sensory deficits  LABORATORY DATA:  I have reviewed the data as listed   Chemistry      Component Value Date/Time   NA 135 05/14/2015 1204   NA 141 05/09/2015 0817   K 3.8 05/14/2015 1204   K 4.3 05/09/2015 0817   CL 100* 05/14/2015 1204   CO2 25 05/14/2015 1204   CO2 25 05/09/2015 0817   BUN 6 05/14/2015 1204   BUN 11.3 05/09/2015 0817   CREATININE 0.81 05/14/2015 1204   CREATININE 0.8 05/09/2015 0817      Component Value Date/Time   CALCIUM 8.9 05/14/2015 1204   CALCIUM 9.0 05/09/2015 0817   ALKPHOS 60 05/09/2015 0817   ALKPHOS 53 07/04/2013 1156   AST 19 05/09/2015 0817   AST 17 07/04/2013 1156   ALT 21 05/09/2015 0817   ALT 18 07/04/2013 1156   BILITOT 0.51 05/09/2015 0817   BILITOT 0.7 07/04/2013 1156       Lab Results  Component Value Date   WBC 5.2 05/14/2015   HGB 13.0 05/14/2015   HCT 38.6 05/14/2015   MCV 92.3 05/14/2015   PLT 316 05/14/2015   NEUTROABS 4.3 05/09/2015   ASSESSMENT & PLAN:  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 Rt Lumpectomy 05/14/15 : 3.6 cm Low grade DCIS Margins Neg, Er 90%, PR 90% Pathology Review: Discussed the path report and provided a copy of the report.  Plan: 1. Patient is debating the risks and benefits of adjuvant radiation therapy. She'll be meeting with radiation oncology to discuss this further in [place]. 2. Adjuvant antiestrogen therapy with tamoxifen 20 mg daily 5 years depending on whether or not she receives radiation. If she does not get radiation, we will call in a prescription for tamoxifen.  Left toe cellulitis/infection: I will prescribe her Keflex 500 by mouth  twice a day for 10 days. If it does not get better in a week she will call her primary care physician.  Patient is also extremely concerned about restless reconstruction and how the expander and implant can tolerate adjuvant radiation. I discussed the MSK[facility] nomogram for DCIS with risk of recurrence of 9% in 10 years but comes down to 2% with both hormonal therapy and radiation and 4% with hormonal therapy alone.  Return to clinic in 3 months for follow-up.  No orders of the defined types were placed in this encounter.   The patient has a good understanding of the overall plan. she agrees with it. she will call with any problems that may develop before the next visit here.   Rulon Eisenmenger, MD  Addendum: I discussed with the patient on the telephone about her decision regarding antiestrogen therapy. She would like to hold off on taking tamoxifen. She is also not going to receive radiation therapy. Patient will be on surveillance going forward.

## 2015-05-29 NOTE — Telephone Encounter (Signed)
Appointments made and avs printed for patient °

## 2015-05-29 NOTE — Progress Notes (Unsigned)
Met with pt prior to f/u with Dr. Lindi Adie. Denies needs at this time. Encourage pt to call with questions or concerns. Received verbal understanding.

## 2015-05-30 DIAGNOSIS — D0511 Intraductal carcinoma in situ of right breast: Secondary | ICD-10-CM | POA: Diagnosis not present

## 2015-05-31 ENCOUNTER — Telehealth: Payer: Self-pay | Admitting: *Deleted

## 2015-05-31 NOTE — Telephone Encounter (Signed)
VM message from pt received @ 11:11 am regarding her appt with Dr. Orlene Erm in [place].  She was told she did not need radiation txs but has questions regarding tamoxifen. She is requesting call back from Dr. Lindi Adie.

## 2015-07-03 ENCOUNTER — Encounter: Payer: Self-pay | Admitting: Internal Medicine

## 2015-07-03 ENCOUNTER — Ambulatory Visit (INDEPENDENT_AMBULATORY_CARE_PROVIDER_SITE_OTHER): Payer: Medicare Other | Admitting: Internal Medicine

## 2015-07-03 VITALS — BP 118/78 | HR 70 | Temp 97.2°F | Ht 64.0 in | Wt 173.0 lb

## 2015-07-03 DIAGNOSIS — J019 Acute sinusitis, unspecified: Secondary | ICD-10-CM | POA: Diagnosis not present

## 2015-07-03 DIAGNOSIS — J309 Allergic rhinitis, unspecified: Secondary | ICD-10-CM | POA: Diagnosis not present

## 2015-07-03 DIAGNOSIS — J3489 Other specified disorders of nose and nasal sinuses: Secondary | ICD-10-CM

## 2015-07-03 MED ORDER — DOXYCYCLINE HYCLATE 100 MG PO TABS: 100.0000 mg | ORAL_TABLET | Freq: Two times a day (BID) | ORAL | Status: DC

## 2015-07-03 MED ORDER — MUPIROCIN CALCIUM 2 % NA OINT: 1.0000 "application " | TOPICAL_OINTMENT | Freq: Two times a day (BID) | NASAL | Status: DC

## 2015-07-03 MED ORDER — TRIAMCINOLONE ACETONIDE 55 MCG/ACT NA AERO: 2.0000 | INHALATION_SPRAY | Freq: Every day | NASAL | Status: DC

## 2015-07-03 NOTE — Patient Instructions (Signed)
Please take all new medication as prescribed - the antibiotic pills and the nasal ointment  OK to stop the flonase  Please take all new medication as prescribed - the nasacort instead  Please continue all other medications as before, and refills have been done if requested.  Please have the pharmacy call with any other refills you may need.  Please keep your appointments with your specialists as you may have planned

## 2015-07-03 NOTE — Progress Notes (Signed)
Subjective:    Patient ID: [name] Bell, female    DOB: 06/12/1941, 74 y.o.   MRN: [id number]  HPI  Here to f/u; overall doing ok,  Pt denies chest pain, increasing sob or doe, wheezing, orthopnea, PND, increased LE swelling, palpitations, dizziness or syncope.  Pt denies new neurological symptoms such as new headache, or facial or extremity weakness or numbness.  Pt denies polydipsia, polyuria, or low sugar episode.   Pt denies new neurological symptoms such as new headache, or facial or extremity weakness or numbness.   Pt states overall good compliance with meds.  Does have several wks ongoing nasal allergy symptoms with clearish congestion, itch and sneezing, without fever, pain, ST, cough, swelling or wheezing.  Has recurring nasal sores on right side for 2-3 days, after having recent right toe and right wrist infections previous post breast surgury Past Medical History  Diagnosis Date  . 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 ALLERGIC RHINITIS 01/24/2008  . ANEMIA-IRON DEFICIENCY 01/24/2008    pt denied  . ANXIETY 02/26/2009  . BACK PAIN 09/30/2010  . DIZZINESS 02/28/2010  . ELEVATED BLOOD PRESSURE WITHOUT DIAGNOSIS OF HYPERTENSION 02/26/2009  . FATIGUE 02/26/2009  . GERD 01/24/2008  . HYPERLIPIDEMIA 08/16/2008  . SHINGLES 11/06/2010  . TOE PAIN 09/30/2010  . VARICOSE VEINS, LOWER EXTREMITIES 08/16/2008  . 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 Cancer of central portion of female breast 05/04/2015  . Breast cancer   . History of blood transfusion ectopic pregnancy   Past Surgical History  Procedure Laterality Date  . Tonsillectomy    . Ectopic pregnancy surgery      at 74 yo with salpingectomy  . S/p bilateral breasts implants  12/2007  . S/p lumbar surgury  july 2010  . Foot surgery Right     bunion, hammer toe.  x 2  . Breast lumpectomy Right 05/14/2015    Procedure: RIGHT BREAST LUMPECTOMY;  Surgeon: Alphonsa Overall, MD;  Location: [facility];  Bell: General;  Laterality: Right;     reports that she has never smoked. She has never used smokeless tobacco. She reports that she drinks about 3.6 oz of alcohol per week. She reports that she does not use illicit drugs. family history includes Cancer in her father. No Known Allergies Current Outpatient Prescriptions on File Prior to Visit  Medication Sig Dispense Refill  . amoxicillin (AMOXIL) 500 MG capsule   0  . cephALEXin (KEFLEX) 500 MG capsule Take 1 capsule (500 mg total) by mouth 2 (two) times daily. (Patient not taking: Reported on 07/03/2015) 20 capsule 0  . clonazePAM (KLONOPIN) 1 MG tablet Take 1 tablet (1 mg total) by mouth 2 (two) times daily as needed for anxiety. 60 tablet 5  . Coenzyme Q10 (CO Q 10 PO) Take 1 capsule by mouth daily.    . fluticasone (FLONASE) 50 MCG/ACT nasal spray Place 1 spray into both nostrils 2 (two) times daily.    . Glucosamine HCl (GLUCOSAMINE PO) Take 1 tablet by mouth daily.    . hydrochlorothiazide (HYDRODIURIL) 25 MG tablet Take 25 mg by mouth daily as needed (for fluid).    Marland Kitchen HYDROcodone-acetaminophen (NORCO/VICODIN) 5-325 MG per tablet Take 1-2 tablets by mouth every 6 (six) hours as needed. (Patient not taking: Reported on 07/03/2015) 30 tablet 0  . Multiple Vitamins-Minerals (HAIR/SKIN/NAILS PO) Take 1 tablet by mouth daily.    . vitamin B-12 (CYANOCOBALAMIN) 500 MCG tablet Take 500 mcg by mouth daily.     No  current facility-administered medications on file prior to visit.   Review of Systems  Constitutional: Negative for unusual diaphoresis or night sweats HENT: Negative for ringing in ear or discharge Eyes: Negative for double vision or worsening visual disturbance.  Respiratory: Negative for choking and stridor.   Gastrointestinal: Negative for vomiting or other signifcant bowel change Genitourinary: Negative for hematuria or change in urine volume.  Musculoskeletal: Negative for other MSK pain or swelling Skin: Negative for color change and worsening wound.   Neurological: Negative for tremors and numbness other than noted  Psychiatric/Behavioral: Negative for decreased concentration or agitation other than above       Objective:   Physical Exam BP 118/78 mmHg  Pulse 70  Temp(Src) 97.2 F (36.2 C) (Oral)  Ht 5\' 4"  (1.626 m)  Wt 173 lb (78.472 kg)  BMI 29.68 kg/m2  SpO2 95% VS noted,  Constitutional: Pt appears in no significant distress HENT: Head: NCAT.  Right Ear: External ear normal.  Left Ear: External ear normal.  Bilat tm's with mild erythema.  Max sinus areas mild tender.  Pharynx with mild erythema, no exudate Nasal soreness/red/swelling noted on right Eyes: . Pupils are equal, round, and reactive to light. Conjunctivae and EOM are normal Neck: Normal range of motion. Neck supple.  Cardiovascular: Normal rate and regular rhythm.   Pulmonary/Chest: Effort normal and breath sounds without rales or wheezing.  Neurological: Pt is alert. Not confused , motor grossly intact Skin: Skin is warm. No rash, no LE edema Psychiatric: Pt behavior is normal. No agitation.      Assessment & Plan:

## 2015-07-05 NOTE — Assessment & Plan Note (Signed)
Mild to mod, for antibx course,  to f/u any worsening symptoms or concerns 

## 2015-07-05 NOTE — Assessment & Plan Note (Signed)
Also to change flonase to nasacort to reduce risk of nose bleed,  to f/u any worsening symptoms or concerns

## 2015-07-05 NOTE — Assessment & Plan Note (Signed)
Also for mupirocin nasal ointment,  to f/u any worsening symptoms or concerns

## 2015-07-19 ENCOUNTER — Ambulatory Visit: Payer: Medicare Other | Admitting: Internal Medicine

## 2015-07-25 ENCOUNTER — Other Ambulatory Visit: Payer: Self-pay | Admitting: *Deleted

## 2015-07-25 NOTE — Telephone Encounter (Signed)
Patient requesting a refill 

## 2015-07-26 MED ORDER — CLONAZEPAM 1 MG PO TABS: 1.0000 mg | ORAL_TABLET | Freq: Two times a day (BID) | ORAL | Status: DC | PRN

## 2015-07-26 NOTE — Telephone Encounter (Signed)
Clonazepam refill requested. Per last office note- patient to remain on medication. Refill approved and sent to patient's pharmacy.   

## 2015-07-30 ENCOUNTER — Encounter: Payer: Self-pay | Admitting: Internal Medicine

## 2015-07-30 ENCOUNTER — Ambulatory Visit (INDEPENDENT_AMBULATORY_CARE_PROVIDER_SITE_OTHER): Payer: Medicare Other | Admitting: Internal Medicine

## 2015-07-30 ENCOUNTER — Other Ambulatory Visit (INDEPENDENT_AMBULATORY_CARE_PROVIDER_SITE_OTHER): Payer: Medicare Other

## 2015-07-30 ENCOUNTER — Ambulatory Visit (INDEPENDENT_AMBULATORY_CARE_PROVIDER_SITE_OTHER)
Admission: RE | Admit: 2015-07-30 | Discharge: 2015-07-30 | Disposition: A | Discharge status: Home / Self Care | Payer: Medicare Other | Source: Ambulatory Visit | Attending: Internal Medicine | Admitting: Internal Medicine

## 2015-07-30 VITALS — BP 146/94 | HR 71 | Temp 97.8°F | Resp 16 | Wt 171.0 lb

## 2015-07-30 DIAGNOSIS — M19071 Primary osteoarthritis, right ankle and foot: Secondary | ICD-10-CM | POA: Diagnosis not present

## 2015-07-30 DIAGNOSIS — Z9889 Other specified postprocedural states: Secondary | ICD-10-CM | POA: Diagnosis not present

## 2015-07-30 DIAGNOSIS — L03011 Cellulitis of right finger: Secondary | ICD-10-CM

## 2015-07-30 LAB — CBC WITH DIFFERENTIAL/PLATELET
Basophils Absolute: 0.1 10*3/uL (ref 0.0–0.1)
Basophils Relative: 0.7 % (ref 0.0–3.0)
Eosinophils Absolute: 0.2 10*3/uL (ref 0.0–0.7)
Eosinophils Relative: 2.2 % (ref 0.0–5.0)
HCT: 41.5 % (ref 36.0–46.0)
Hemoglobin: 14 g/dL (ref 12.0–15.0)
LYMPHS ABS: 2.1 10*3/uL (ref 0.7–4.0)
Lymphocytes Relative: 26.7 % (ref 12.0–46.0)
MCHC: 33.8 g/dL (ref 30.0–36.0)
MCV: 93.1 fl (ref 78.0–100.0)
Monocytes Absolute: 0.8 10*3/uL (ref 0.1–1.0)
Monocytes Relative: 10 % (ref 3.0–12.0)
Neutro Abs: 4.8 10*3/uL (ref 1.4–7.7)
Neutrophils Relative %: 60.4 % (ref 43.0–77.0)
Platelets: 359 10*3/uL (ref 150.0–400.0)
RBC: 4.46 Mil/uL (ref 3.87–5.11)
RDW: 13 % (ref 11.5–15.5)
WBC: 7.9 10*3/uL (ref 4.0–10.5)

## 2015-07-30 MED ORDER — CEPHALEXIN 500 MG PO CAPS: 500.0000 mg | ORAL_CAPSULE | Freq: Two times a day (BID) | ORAL | Status: DC

## 2015-07-30 MED ORDER — KETOCONAZOLE 2 % EX CREA: 1.0000 "application " | TOPICAL_CREAM | Freq: Every day | CUTANEOUS | Status: DC

## 2015-07-30 NOTE — Patient Instructions (Addendum)
Soak toe twice a day in saline. The saline can be purchased at the drugstore or you can make your own .Boil cup of salt in a gallon of water. Store mixture  in a clean container.Report Warning  signs as discussed (red streaks, pus, fever, increasing pain).  Nizoral applied topically and blown  dry with hair dryer after saline soaks twice a day.   Your next office appointment will be determined based upon review of your pending labs  and  xrays  Those written interpretation of the lab results and instructions will be transmitted to you by mail for your records.  Critical results will be called.   Followup as needed for any active or acute issue. Please report any significant change in your symptoms.

## 2015-07-30 NOTE — Progress Notes (Signed)
   Subjective:    Patient ID: [name] Bell, female    DOB: 06-Sep-1941, 74 y.o.   MRN: [id number]  HPI  Her symptoms began 1 week after she had a lumpectomy 05/14/15. She believes her toe became infected several days later. She was Rxed 10-14 days of penicillin by the Surgeon. As of 07/16/15 she was placed on amoxicillin for dental work  She saw her primary care physician 7/5 who prescribed doxycycline for  the toe infection and prescribed Bactroban for nasal infection.  Toe continues to be stiff and hurts with ambulation. She's been soaking it in [employer] over the weekend.  On one occasion it did drain pus and blood.    Review of Systems She denies fever, chills, or red streaks going up the leg.  She does state that she feels cold all the time.  The nasal symptoms have resolved and there is no residual symptoms or signs of upper respiratory tract infection.    Objective:   Physical Exam  Pertinent or positive findings include: She tends to hold her head tilted to the left. There is faint erythema over the tip of the right great toe. There is punctate crusting at the medial aspect of the right great toenail. There is no evidence of cellulitis or active pustule formation. Pedal pulses are excellent.  General appearance :adequately nourished; in no distress.  Eyes: No conjunctival inflammation or scleral icterus is present.  Heart:  Normal rate and regular rhythm. S1 and S2 normal without gallop, murmur, click, rub or other extra sounds    Lungs:Chest clear to auscultation; no wheezes, rhonchi,rales ,or rubs present.No increased work of breathing.   Abdomen: bowel sounds normal, soft and non-tender without masses, organomegaly or hernias noted.  No guarding or rebound.   Vascular : all pulses equal ; no bruits present.  Skin:Warm & dry.  Intact without suspicious lesions or rashes ; no tenting or jaundice   Lymphatic: No lymphadenopathy is noted about the head, neck, axilla.    Neuro: Strength, tone  normal.        Assessment & Plan:  #1 paronychia right great toe, rule out osteomyelitis.  See orders and after visit summary

## 2015-07-30 NOTE — Progress Notes (Signed)
Pre visit review using our clinic review tool, if applicable. No additional management support is needed unless otherwise documented below in the visit note. 

## 2015-08-01 ENCOUNTER — Encounter: Payer: Self-pay | Admitting: Internal Medicine

## 2015-08-01 ENCOUNTER — Ambulatory Visit (INDEPENDENT_AMBULATORY_CARE_PROVIDER_SITE_OTHER): Payer: Medicare Other | Admitting: Internal Medicine

## 2015-08-01 VITALS — BP 128/84 | HR 60 | Temp 97.6°F | Ht 64.0 in | Wt 171.0 lb

## 2015-08-01 DIAGNOSIS — L03031 Cellulitis of right toe: Secondary | ICD-10-CM | POA: Insufficient documentation

## 2015-08-01 DIAGNOSIS — R7302 Impaired glucose tolerance (oral): Secondary | ICD-10-CM | POA: Diagnosis not present

## 2015-08-01 DIAGNOSIS — F411 Generalized anxiety disorder: Secondary | ICD-10-CM | POA: Diagnosis not present

## 2015-08-01 MED ORDER — CLINDAMYCIN HCL 300 MG PO CAPS: 300.0000 mg | ORAL_CAPSULE | Freq: Four times a day (QID) | ORAL | Status: DC

## 2015-08-01 MED ORDER — HYDROCODONE-ACETAMINOPHEN 5-325 MG PO TABS: 1.0000 | ORAL_TABLET | Freq: Four times a day (QID) | ORAL | Status: DC | PRN

## 2015-08-01 NOTE — Assessment & Plan Note (Signed)
stable overall by history and exam, recent data reviewed with pt, and pt to continue medical treatment as before,  to f/u any worsening symptoms or concerns  Lab Results  Component Value Date   WBC 7.9 07/30/2015   HGB 14.0 07/30/2015   HCT 41.5 07/30/2015   PLT 359.0 07/30/2015   GLUCOSE 93 05/14/2015   CHOL 231* 03/27/2015   TRIG 70.0 03/27/2015   HDL 77.40 03/27/2015   LDLDIRECT 128.5 07/04/2013   LDLCALC 140* 03/27/2015   ALT 21 05/09/2015   AST 19 05/09/2015   NA 135 05/14/2015   K 3.8 05/14/2015   CL 100* 05/14/2015   CREATININE 0.81 05/14/2015   BUN 6 05/14/2015   CO2 25 05/14/2015   TSH 1.77 03/27/2015   INR 0.9 07/17/2009   HGBA1C 5.4 02/06/2015

## 2015-08-01 NOTE — Patient Instructions (Signed)
You had the antibiotic shot today  OK to stop the cephalexin  Please take all new medication as prescribed - the pain medication, and the new antibiotic (cleocin)  Please continue all other medications as before, and refills have been done if requested.  Please have the pharmacy call with any other refills you may need.  Please keep your appointments with your specialists as you may have planned  You are given the letter for vacation cancelling as well

## 2015-08-01 NOTE — Progress Notes (Signed)
Subjective:    Patient ID: [name] Bell, female    DOB: [birth year], 74 y.o.   MRN: [id number]  HPI  Here unfortunately with worsening of the right great toe pain, redness, swelling , which seemed to start 2-3 days ago to the medial aspect, but now involving the lateral aspect and essentially nearly the whole toe, though no ulcer, drainage, red streaks, fever, n/v .  Has been compliant with cephalexin.  Planning to leave for [place] in 2 days, wants to know if should postpone trip. Denies worsening depressive symptoms, suicidal ideation, or panic; Pt denies chest pain, increased sob or doe, wheezing, orthopnea, PND, increased LE swelling, palpitations, dizziness or syncope. Past Medical History  Diagnosis Date  . 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 ALLERGIC RHINITIS 01/24/2008  . ANEMIA-IRON DEFICIENCY 01/24/2008    pt denied  . ANXIETY 02/26/2009  . BACK PAIN 09/30/2010  . DIZZINESS 02/28/2010  . ELEVATED BLOOD PRESSURE WITHOUT DIAGNOSIS OF HYPERTENSION 02/26/2009  . FATIGUE 02/26/2009  . GERD 01/24/2008  . HYPERLIPIDEMIA 08/16/2008  . SHINGLES 11/06/2010  . TOE PAIN 09/30/2010  . VARICOSE VEINS, LOWER EXTREMITIES 08/16/2008  . 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 Cancer of central portion of female breast 05/04/2015  . Breast cancer   . History of blood transfusion ectopic pregnancy   Past Surgical History  Procedure Laterality Date  . Tonsillectomy    . Ectopic pregnancy surgery      at 74 yo with salpingectomy  . S/p bilateral breasts implants  12/2007  . S/p lumbar surgury  july 2010  . Foot surgery Right     bunion, hammer toe.  x 2  . Breast lumpectomy Right 05/14/2015    Procedure: RIGHT BREAST LUMPECTOMY;  Surgeon: Alphonsa Overall, MD;  Location: [facility];  Bell: General;  Laterality: Right;    reports that she has never smoked. She has never used smokeless tobacco. She reports that she drinks about 3.6 oz of alcohol per week. She reports that she does not use illicit  drugs. family history includes Cancer in her father. No Known Allergies Current Outpatient Prescriptions on File Prior to Visit  Medication Sig Dispense Refill  . clonazePAM (KLONOPIN) 1 MG tablet Take 1 tablet (1 mg total) by mouth 2 (two) times daily as needed for anxiety. 60 tablet 5  . Coenzyme Q10 (CO Q 10 PO) Take 1 capsule by mouth daily.    . Glucosamine HCl (GLUCOSAMINE PO) Take 1 tablet by mouth daily.    . hydrochlorothiazide (HYDRODIURIL) 25 MG tablet Take 25 mg by mouth daily as needed (for fluid).    Marland Kitchen ketoconazole (NIZORAL) 2 % cream Apply 1 application topically daily. .  Nizoral applied topically and blown  dry with hair dryer after saline soaks twice a day. 15 g 0  . Multiple Vitamins-Minerals (HAIR/SKIN/NAILS PO) Take 1 tablet by mouth daily.    Marland Kitchen triamcinolone (NASACORT AQ) 55 MCG/ACT AERO nasal inhaler Place 2 sprays into the nose daily. 1 Inhaler 12  . vitamin B-12 (CYANOCOBALAMIN) 500 MCG tablet Take 500 mcg by mouth daily.    . mupirocin nasal ointment (BACTROBAN) 2 % Place 1 application into the nose 2 (two) times daily. Use one-half of tube in each nostril twice daily for five (5) days. After application, press sides of nose together and gently massage. (Patient not taking: Reported on 08/01/2015) 10 g 0   No current facility-administered medications on file prior to visit.    Review of Systems  Constitutional: Negative for unusual diaphoresis or night sweats HENT: Negative for ringing in ear or discharge Eyes: Negative for double vision or worsening visual disturbance.  Respiratory: Negative for choking and stridor.   Gastrointestinal: Negative for vomiting or other signifcant bowel change Genitourinary: Negative for hematuria or change in urine volume.  Musculoskeletal: Negative for other MSK pain or swelling Skin: Negative for color change and worsening wound.  Neurological: Negative for tremors and numbness other than noted  Psychiatric/Behavioral: Negative  for decreased concentration or agitation other than above       Objective:   Physical Exam BP 128/84 mmHg  Pulse 60  Temp(Src) 97.6 F (36.4 C)  Ht 5\' 4"  (1.626 m)  Wt 171 lb (77.565 kg)  BMI 29.34 kg/m2  SpO2 96% VS noted,  Constitutional: Pt appears in no significant distress HENT: Head: NCAT.  Right Ear: External ear normal.  Left Ear: External ear normal.  Eyes: . Pupils are equal, round, and reactive to light. Conjunctivae and EOM are normal Neck: Normal range of motion. Neck supple.  Cardiovascular: Normal rate and regular rhythm.   Pulmonary/Chest: Effort normal and breath sounds without rales or wheezing.  Neurological: Pt is alert. Not confused , motor grossly intact Skin:  no LE edema, right great toe with diffuse marked red/tender/swelling 1-2+ without ulcer, drainage, fluctuance or red streaks. Psychiatric: Pt behavior is normal. No agitation. mild nervous    Assessment & Plan:

## 2015-08-01 NOTE — Assessment & Plan Note (Addendum)
Mild to mod, for change cephalexin to cleocin course,  to f/u any worsening symptoms or concerns, advised to not go on trip as care in [place] is difficult to access per pt if worsens

## 2015-08-01 NOTE — Assessment & Plan Note (Signed)
stable overall by history and exam, recent data reviewed with pt, and pt to continue medical treatment as before,  to f/u any worsening symptoms or concerns Lab Results  Component Value Date   HGBA1C 5.4 02/06/2015

## 2015-08-02 ENCOUNTER — Ambulatory Visit: Payer: Medicare Other | Admitting: Neurology

## 2015-08-02 MED ORDER — CEFTRIAXONE SODIUM 1 G IJ SOLR
1.0000 g | Freq: Once | INTRAMUSCULAR | Status: AC
  Administered 2015-08-01: 1 g via INTRAMUSCULAR

## 2015-08-02 NOTE — Addendum Note (Signed)
Addended by: Lyman Bishop on: 08/02/2015 08:25 AM   Modules accepted: Orders

## 2015-08-03 ENCOUNTER — Telehealth: Payer: Self-pay | Admitting: Neurology

## 2015-08-03 ENCOUNTER — Ambulatory Visit: Payer: Medicare Other | Admitting: Neurology

## 2015-08-08 ENCOUNTER — Ambulatory Visit (INDEPENDENT_AMBULATORY_CARE_PROVIDER_SITE_OTHER): Payer: Medicare Other | Admitting: Internal Medicine

## 2015-08-08 ENCOUNTER — Encounter: Payer: Self-pay | Admitting: Internal Medicine

## 2015-08-08 VITALS — BP 124/82 | HR 60 | Temp 97.8°F | Ht 64.0 in | Wt 173.0 lb

## 2015-08-08 DIAGNOSIS — L03031 Cellulitis of right toe: Secondary | ICD-10-CM

## 2015-08-08 DIAGNOSIS — R7302 Impaired glucose tolerance (oral): Secondary | ICD-10-CM | POA: Diagnosis not present

## 2015-08-08 DIAGNOSIS — F411 Generalized anxiety disorder: Secondary | ICD-10-CM | POA: Diagnosis not present

## 2015-08-08 NOTE — Assessment & Plan Note (Signed)
Essentially resolved, pt reassured, no further tx needed at this time

## 2015-08-08 NOTE — Assessment & Plan Note (Signed)
stable overall by history and exam, recent data reviewed with pt, and pt to continue medical treatment as before,  to f/u any worsening symptoms or concerns Lab Results  Component Value Date   WBC 7.9 07/30/2015   HGB 14.0 07/30/2015   HCT 41.5 07/30/2015   PLT 359.0 07/30/2015   GLUCOSE 93 05/14/2015   CHOL 231* 03/27/2015   TRIG 70.0 03/27/2015   HDL 77.40 03/27/2015   LDLDIRECT 128.5 07/04/2013   LDLCALC 140* 03/27/2015   ALT 21 05/09/2015   AST 19 05/09/2015   NA 135 05/14/2015   K 3.8 05/14/2015   CL 100* 05/14/2015   CREATININE 0.81 05/14/2015   BUN 6 05/14/2015   CO2 25 05/14/2015   TSH 1.77 03/27/2015   INR 0.9 07/17/2009   HGBA1C 5.4 02/06/2015

## 2015-08-08 NOTE — Assessment & Plan Note (Signed)
stable overall by history and exam, recent data reviewed with pt, and pt to continue medical treatment as before,  to f/u any worsening symptoms or concerns Lab Results  Component Value Date   HGBA1C 5.4 02/06/2015

## 2015-08-08 NOTE — Progress Notes (Signed)
Pre visit review using our clinic review tool, if applicable. No additional management support is needed unless otherwise documented below in the visit note. 

## 2015-08-08 NOTE — Patient Instructions (Signed)
Please continue all other medications as before, and refills have been done if requested.  Please have the pharmacy call with any other refills you may need.  Please continue your efforts at being more active, low cholesterol diet, and weight control.  Please keep your appointments with your specialists as you may have planned     

## 2015-08-08 NOTE — Progress Notes (Signed)
Subjective:    Patient ID: [name] Bell, female    DOB: 07-06-1941, 74 y.o.   MRN: [id number]  HPI  Here to fu right great toe, which has seemed to respond to cleocin quite nicely in that there is resolved red/tender/swelling though still has some scaly skin area at the site now healing. Just wanted me to assess due to this, also had a small infected area since seen at lower left chin area now improved as well.  Pt denies chest pain, increased sob or doe, wheezing, orthopnea, PND, increased LE swelling, palpitations, dizziness or syncope. Denies worsening depressive symptoms, suicidal ideation, or panic; has ongoing anxiety   Pt denies polydipsia, polyuria,   Past Medical History  Diagnosis Date  . 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 ALLERGIC RHINITIS 01/24/2008  . ANEMIA-IRON DEFICIENCY 01/24/2008    pt denied  . ANXIETY 02/26/2009  . BACK PAIN 09/30/2010  . DIZZINESS 02/28/2010  . ELEVATED BLOOD PRESSURE WITHOUT DIAGNOSIS OF HYPERTENSION 02/26/2009  . FATIGUE 02/26/2009  . GERD 01/24/2008  . HYPERLIPIDEMIA 08/16/2008  . SHINGLES 11/06/2010  . TOE PAIN 09/30/2010  . VARICOSE VEINS, LOWER EXTREMITIES 08/16/2008  . 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 Cancer of central portion of female breast 05/04/2015  . Breast cancer   . History of blood transfusion ectopic pregnancy   Past Surgical History  Procedure Laterality Date  . Tonsillectomy    . Ectopic pregnancy surgery      at 74 yo with salpingectomy  . S/p bilateral breasts implants  12/2007  . S/p lumbar surgury  july 2010  . Foot surgery Right     bunion, hammer toe.  x 2  . Breast lumpectomy Right 05/14/2015    Procedure: RIGHT BREAST LUMPECTOMY;  Surgeon: Alphonsa Overall, MD;  Location: [place];  Bell: General;  Laterality: Right;    reports that she has never smoked. She has never used smokeless tobacco. She reports that she drinks about 3.6 oz of alcohol per week. She reports that she does not use illicit drugs. family  history includes Cancer in her father. No Known Allergies Current Outpatient Prescriptions on File Prior to Visit  Medication Sig Dispense Refill  . clindamycin (CLEOCIN) 300 MG capsule Take 1 capsule (300 mg total) by mouth 4 (four) times daily. 40 capsule 0  . clonazePAM (KLONOPIN) 1 MG tablet Take 1 tablet (1 mg total) by mouth 2 (two) times daily as needed for anxiety. 60 tablet 5  . Coenzyme Q10 (CO Q 10 PO) Take 1 capsule by mouth daily.    . Glucosamine HCl (GLUCOSAMINE PO) Take 1 tablet by mouth daily.    . hydrochlorothiazide (HYDRODIURIL) 25 MG tablet Take 25 mg by mouth daily as needed (for fluid).    Marland Kitchen HYDROcodone-acetaminophen (NORCO/VICODIN) 5-325 MG per tablet Take 1-2 tablets by mouth every 6 (six) hours as needed for moderate pain or severe pain. 40 tablet 0  . ketoconazole (NIZORAL) 2 % cream Apply 1 application topically daily. .  Nizoral applied topically and blown  dry with hair dryer after saline soaks twice a day. 15 g 0  . Multiple Vitamins-Minerals (HAIR/SKIN/NAILS PO) Take 1 tablet by mouth daily.    Marland Kitchen triamcinolone (NASACORT AQ) 55 MCG/ACT AERO nasal inhaler Place 2 sprays into the nose daily. 1 Inhaler 12  . vitamin B-12 (CYANOCOBALAMIN) 500 MCG tablet Take 500 mcg by mouth daily.    . mupirocin nasal ointment (BACTROBAN) 2 % Place 1 application into the nose 2 (  two) times daily. Use one-half of tube in each nostril twice daily for five (5) days. After application, press sides of nose together and gently massage. (Patient not taking: Reported on 08/01/2015) 10 g 0   No current facility-administered medications on file prior to visit.   Review of Systems  Constitutional: Negative for unusual diaphoresis or night sweats HENT: Negative for ringing in ear or discharge Eyes: Negative for double vision or worsening visual disturbance.  Respiratory: Negative for choking and stridor.   Gastrointestinal: Negative for vomiting or other signifcant bowel change Genitourinary:  Negative for hematuria or change in urine volume.  Musculoskeletal: Negative for other MSK pain or swelling Skin: Negative for color change and worsening wound.  Neurological: Negative for tremors and numbness other than noted  Psychiatric/Behavioral: Negative for decreased concentration or agitation other than above       Objective:   Physical Exam BP 124/82 mmHg  Pulse 60  Temp(Src) 97.8 F (36.6 C) (Oral)  Ht 5\' 4"  (1.626 m)  Wt 173 lb (78.472 kg)  BMI 29.68 kg/m2  SpO2 94% VS noted,  Constitutional: Pt appears in no significant distress HENT: Head: NCAT.  Right Ear: External ear normal.  Left Ear: External ear normal.  Eyes: . Pupils are equal, round, and reactive to light. Conjunctivae and EOM are normal Neck: Normal range of motion. Neck supple.  Cardiovascular: Normal rate and regular rhythm.   Pulmonary/Chest: Effort normal and breath sounds without rales or wheezing.  Neurological: Pt is alert. Not confused , motor grossly intact Skin: Skin is warm. No rash, no LE edema, right great toe without red/tender/drainage, has slight swelling lateral aspect only Psychiatric: Pt behavior is normal. No agitation. mild nervous    Assessment & Plan:

## 2015-08-18 ENCOUNTER — Other Ambulatory Visit: Payer: Self-pay | Admitting: Internal Medicine

## 2015-08-24 ENCOUNTER — Telehealth: Payer: Self-pay | Admitting: *Deleted

## 2015-08-24 MED ORDER — BUPROPION HCL ER (XL) 150 MG PO TB24: 150.0000 mg | ORAL_TABLET | ORAL | Status: DC

## 2015-08-24 NOTE — Telephone Encounter (Signed)
Patient thinks the medication is a good idea. She is feeling very overwhelmed with all her medical issues. She does have a lot of neck pain, but can not come into the office next week. Please advise on dosage of medication.

## 2015-08-24 NOTE — Telephone Encounter (Signed)
RX sent to pharmacy. Will check on patient in 2 weeks.

## 2015-08-24 NOTE — Telephone Encounter (Signed)
wellbutrin xl 150 q AM.  Call me in 2 weeks.  May increase dose.  Will likely take 4-6 weeks for max effect

## 2015-08-24 NOTE — Telephone Encounter (Signed)
Patient not feeling well this morning she would like to increase in her medication please call ASAP Call back number [phone]

## 2015-08-24 NOTE — Telephone Encounter (Signed)
Not safe to increase further.  If jittery means anxious, then perhaps a medication like wellbutrin or buspar would be helpful daily?  If jittery and awful means neck pain because she had to miss last botox, then maybe we can move her to next wed AM

## 2015-08-24 NOTE — Addendum Note (Signed)
Addended byAnnamaria Helling on: 08/24/2015 01:17 PM   Modules accepted: Orders

## 2015-08-24 NOTE — Telephone Encounter (Signed)
Spoke with patient and she states since having breast cancer surgery in May and having to be on 5 different antibiotics for a foot infection she has been feeling jittery and awful. She missed her last Botox injection and her next injection is not scheduled for two more weeks. She is currently on Clonazepam 1 mg - twice daily and wants to increase. Please advise.

## 2015-08-28 ENCOUNTER — Telehealth: Payer: Self-pay | Admitting: Hematology and Oncology

## 2015-08-28 ENCOUNTER — Ambulatory Visit (HOSPITAL_BASED_OUTPATIENT_CLINIC_OR_DEPARTMENT_OTHER): Payer: Medicare Other | Admitting: Hematology and Oncology

## 2015-08-28 ENCOUNTER — Encounter: Payer: Self-pay | Admitting: Hematology and Oncology

## 2015-08-28 ENCOUNTER — Other Ambulatory Visit: Payer: Self-pay | Admitting: Adult Health

## 2015-08-28 VITALS — BP 139/71 | HR 58 | Temp 97.8°F | Resp 18 | Ht 64.0 in | Wt 173.4 lb

## 2015-08-28 DIAGNOSIS — C50111 Malignant neoplasm of central portion of right female breast: Secondary | ICD-10-CM | POA: Diagnosis not present

## 2015-08-28 NOTE — Assessment & Plan Note (Signed)
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 Breast exam 08/28/2015: Normal 2. Mammogram to be done once a year  Return to clinic in 6 months for follow-up

## 2015-08-28 NOTE — Telephone Encounter (Signed)
Gave avs & calendar for February. °

## 2015-08-28 NOTE — Progress Notes (Signed)
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   Staging form: Breast, AJCC 7th Edition     Clinical stage from 05/09/2015: Stage 0 (Tis (DCIS), N0, M0) - Unsigned       Staging comments: Staged at breast conference 5.11.16    SUMMARY OF ONCOLOGIC HISTORY:   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   05/01/2015 Initial Diagnosis Right breast biopsy: DCIS grade 1, ER 90%, PR 90%   05/08/2015 Breast MRI Right breast postbiopsy changes within the subareolar portion extends to involve the nipple together with the enhancement and biopsy change 3 x 2.2 x 2 cm size; worrisome calcifications measure 2.2 cm   05/14/2015 Surgery Rt Lumpectomy: 3.6 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 She is here for routine follow-up. She reports that the cellulitis of her toe that she had when I saw her previously had to be treated with 5 sets of antibiotics. Finally Cleared. She had to cancel her trip to [place] because of this.  REVIEW OF SYSTEMS:   Constitutional: Denies fevers, chills or abnormal weight loss Eyes: Denies blurriness of vision Ears, nose,  mouth, throat, and face: Denies mucositis or sore throat Respiratory: Denies cough, dyspnea or wheezes Cardiovascular: Denies palpitation, chest discomfort or lower extremity swelling Gastrointestinal:  Denies nausea, heartburn or change in bowel habits Skin: Denies abnormal skin rashes Lymphatics: Denies new lymphadenopathy or easy bruising Neurological:Denies numbness, tingling or new weaknesses Behavioral/Psych: Mood is stable, no new changes  Breast: Occasional discomfort in the right breast along the surgical site All other systems were reviewed with the patient and are negative.  I have reviewed the past medical history, past surgical history, social history and family history with the patient and they are unchanged from previous note.  ALLERGIES:  has No Known Allergies.  MEDICATIONS:  Current Outpatient Prescriptions  Medication Sig Dispense Refill  . buPROPion (WELLBUTRIN XL) 150 MG 24 hr tablet Take 1 tablet (150 mg total) by mouth every morning. 30 tablet 1  . clindamycin (CLEOCIN) 300 MG capsule Take 1 capsule (300 mg total) by mouth 4 (four) times daily. 40 capsule 0  . clonazePAM (KLONOPIN) 1 MG tablet Take 1 tablet (1 mg total) by mouth 2 (two) times daily as needed for anxiety. 60 tablet 5  . Coenzyme Q10 (CO Q 10 PO) Take 1 capsule by mouth daily.    . Glucosamine HCl (GLUCOSAMINE PO) Take 1 tablet by mouth daily.    . hydrochlorothiazide (HYDRODIURIL) 25 MG tablet Take 25 mg by mouth daily as needed (for fluid).    . hydrochlorothiazide (HYDRODIURIL) 25 MG tablet TAKE 1 TABLET (25 MG TOTAL) BY MOUTH DAILY. 90 tablet 1  .  HYDROcodone-acetaminophen (NORCO/VICODIN) 5-325 MG per tablet Take 1-2 tablets by mouth every 6 (six) hours as needed for moderate pain or severe pain. 40 tablet 0  . ketoconazole (NIZORAL) 2 % cream Apply 1 application topically daily. .  Nizoral applied topically and blown  dry with hair dryer after saline soaks twice a day. 15 g 0  . Multiple  Vitamins-Minerals (HAIR/SKIN/NAILS PO) Take 1 tablet by mouth daily.    . mupirocin nasal ointment (BACTROBAN) 2 % Place 1 application into the nose 2 (two) times daily. Use one-half of tube in each nostril twice daily for five (5) days. After application, press sides of nose together and gently massage. 10 g 0  . triamcinolone (NASACORT AQ) 55 MCG/ACT AERO nasal inhaler Place 2 sprays into the nose daily. 1 Inhaler 12  . vitamin B-12 (CYANOCOBALAMIN) 500 MCG tablet Take 500 mcg by mouth daily.     No current facility-administered medications for this visit.    PHYSICAL EXAMINATION: ECOG PERFORMANCE STATUS: 1 - Symptomatic but completely ambulatory  Filed Vitals:   08/28/15 1055  BP: 139/71  Pulse: 58  Temp: 97.8 F (36.6 C)  Resp: 18   Filed Weights   08/28/15 1055  Weight: 173 lb 6.4 oz (78.654 kg)    GENERAL:alert, no distress and comfortable SKIN: skin color, texture, turgor are normal, no rashes or significant lesions EYES: normal, Conjunctiva are pink and non-injected, sclera clear OROPHARYNX:no exudate, no erythema and lips, buccal mucosa, and tongue normal  NECK: supple, thyroid normal size, non-tender, without nodularity LYMPH:  no palpable lymphadenopathy in the cervical, axillary or inguinal LUNGS: clear to auscultation and percussion with normal breathing effort HEART: regular rate & rhythm and no murmurs and no lower extremity edema ABDOMEN:abdomen soft, non-tender and normal bowel sounds Musculoskeletal:no cyanosis of digits and no clubbing  NEURO: alert & oriented x 3 with fluent speech, no focal motor/sensory deficits BREAST: No palpable masses or nodules in either right or left breasts. No palpable axillary supraclavicular or infraclavicular adenopathy no breast tenderness or nipple discharge. (exam performed in the presence of a chaperone)  LABORATORY DATA:  I have reviewed the data as listed   Chemistry      Component Value Date/Time   NA 135 05/14/2015  1204   NA 141 05/09/2015 0817   K 3.8 05/14/2015 1204   K 4.3 05/09/2015 0817   CL 100* 05/14/2015 1204   CO2 25 05/14/2015 1204   CO2 25 05/09/2015 0817   BUN 6 05/14/2015 1204   BUN 11.3 05/09/2015 0817   CREATININE 0.81 05/14/2015 1204   CREATININE 0.8 05/09/2015 0817      Component Value Date/Time   CALCIUM 8.9 05/14/2015 1204   CALCIUM 9.0 05/09/2015 0817   ALKPHOS 60 05/09/2015 0817   ALKPHOS 53 07/04/2013 1156   AST 19 05/09/2015 0817   AST 17 07/04/2013 1156   ALT 21 05/09/2015 0817   ALT 18 07/04/2013 1156   BILITOT 0.51 05/09/2015 0817   BILITOT 0.7 07/04/2013 1156       Lab Results  Component Value Date   WBC 7.9 07/30/2015   HGB 14.0 07/30/2015   HCT 41.5 07/30/2015   MCV 93.1 07/30/2015   PLT 359.0 07/30/2015   NEUTROABS 4.8 07/30/2015   ASSESSMENT & PLAN:  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 Breast exam 08/28/2015: Normal 2. Mammogram to be done once a year to be done in May 2017  Cellulitis involving the toe, nasal area required 4 sets of antibiotics and finally it cleared. Because of this, she canceled her trip to [place]. She plans to go and meet her family next year instead.  Return to clinic in 6 months for follow-up   No orders of the defined types were placed in this encounter.   The patient has a good understanding of the Bell plan. she agrees with it. she will call with any problems that may develop before the next visit here.   Rulon Eisenmenger, MD

## 2015-08-30 ENCOUNTER — Telehealth: Payer: Self-pay | Admitting: Nurse Practitioner

## 2015-08-30 NOTE — Telephone Encounter (Signed)
Called patient for survivorship and she declines this at this time  [name] Bell

## 2015-09-06 ENCOUNTER — Telehealth: Payer: Self-pay | Admitting: *Deleted

## 2015-09-06 NOTE — Telephone Encounter (Signed)
Spoke to pt concerning needs for 3 mon f/u. Relate doing well. She is having some difficulty with body image issues at this time. She is going to see a Psychiatric nurse in [place] to discuss options. Denies questions or needs at this time. Encourage pt to call with concerns. Received verbal understanding.

## 2015-09-07 ENCOUNTER — Ambulatory Visit (INDEPENDENT_AMBULATORY_CARE_PROVIDER_SITE_OTHER): Payer: Medicare Other | Admitting: Neurology

## 2015-09-07 ENCOUNTER — Telehealth: Payer: Self-pay | Admitting: *Deleted

## 2015-09-07 ENCOUNTER — Encounter: Payer: Self-pay | Admitting: Neurology

## 2015-09-07 VITALS — Temp 97.6°F

## 2015-09-07 DIAGNOSIS — G243 Spasmodic torticollis: Secondary | ICD-10-CM

## 2015-09-07 MED ORDER — ONABOTULINUMTOXINA 100 UNITS IJ SOLR
300.0000 [IU] | Freq: Once | INTRAMUSCULAR | Status: AC
  Administered 2015-09-07: 300 [IU] via INTRAMUSCULAR

## 2015-09-07 NOTE — Procedures (Signed)
[facility]   Procedure Note Botox  Attending: Dr. Danell Vazquez  Preoperative Diagnosis(es): Cervical Dystonia  Result History  Onset of effect: 2 days Duration of Benefit: 10 days ago Adverse Effects: no dysphagia last time   Consent obtained from: The patient  Benefits discussed included, but were not limited to decreased muscle tightness, increased joint range of motion, and decreased pain.  Risk discussed included, but were not limited pain and discomfort, bleeding, bruising, excessive weakness, venous thrombosis, muscle atrophy and dysphagia.  A copy of the patient medication guide was given to the patient which explains the blackbox warning.  Informed consent was obtained  Patients identity and treatment sites confirmed yes.     Details of Procedure: Skin was cleaned with alcohol.  A 30 gauge, 1/2 inch needle was introduced to the target muscle (except splenius capitus, posterior approach, where 27 inch, 1 1/2 gauge needle was used).  Prior to injection, the needle plunger was aspirated to make sure the needle was not within a blood vessel.  There was no blood retrieved on aspiration.    Following is a summary of the muscles injected  And the amount of Botulinum toxin used:   Dilution 0.9% preservative free saline mixed with 100 u Botox type A to make 10 U per 0.1cc  Injections  Location Left  Right Units Number of sites        Sternocleidomastoid 60+30 0 90 1  Splenius Capitus, posterior approach 0 100 100 1  Splenius Capitus, lateral approach 0 60 60 1  Levator Scapulae 30  30 0  Trapezius            TOTAL UNITS:   280    Agent: Botulinum Type A ( Onobotulinum Toxin type A ). 3 vials of Botox were used, containing 100 units and freshly diluted with 1 mL of sterile, non-preserved saline    Total injected (Units): 280  Total wasted (Units): 20 units   Pt tolerated procedure well without complications.   Reinjection is anticipated in 3 months.  

## 2015-09-07 NOTE — Progress Notes (Signed)
botox only appt

## 2015-09-07 NOTE — Telephone Encounter (Signed)
Pt called stating that she forgot to get her paperwork from up front today and could not come back until 9/20 to get.  Told her that was fine and I made Mrs. [name] Bell up front aware.

## 2015-09-20 ENCOUNTER — Encounter: Payer: Self-pay | Admitting: Nurse Practitioner

## 2015-09-20 NOTE — Progress Notes (Signed)
The Survivorship Care Plan was mailed to [name] Bell as she reported not being able to come in to the Survivorship Clinic for an in-person visit at this time. A letter was mailed to her outlining the purpose of the content of the care plan, as well as encouraging her to reach out to me with any questions or concerns.  My business card was included in the correspondence to the patient as well.  A copy of the care plan was also routed/faxed/mailed to [name] Cower, MD, the patient's PCP.  I will not be placing any follow-up appointments to the Survivorship Clinic for [name] Bell, but I am happy to see her at any time in the future for any survivorship concerns that may arise. Thank you for allowing me to participate in her care!  Kenn File, [place] [phone]

## 2015-09-26 ENCOUNTER — Ambulatory Visit: Payer: Medicare Other | Admitting: Internal Medicine

## 2015-10-18 ENCOUNTER — Ambulatory Visit (INDEPENDENT_AMBULATORY_CARE_PROVIDER_SITE_OTHER): Payer: Medicare Other | Admitting: Internal Medicine

## 2015-10-18 ENCOUNTER — Encounter: Payer: Self-pay | Admitting: Internal Medicine

## 2015-10-18 VITALS — BP 120/78 | HR 60 | Temp 97.6°F | Wt 171.0 lb

## 2015-10-18 DIAGNOSIS — F411 Generalized anxiety disorder: Secondary | ICD-10-CM | POA: Diagnosis not present

## 2015-10-18 DIAGNOSIS — Z23 Encounter for immunization: Secondary | ICD-10-CM | POA: Diagnosis not present

## 2015-10-18 DIAGNOSIS — R7302 Impaired glucose tolerance (oral): Secondary | ICD-10-CM

## 2015-10-18 DIAGNOSIS — M79671 Pain in right foot: Secondary | ICD-10-CM | POA: Diagnosis not present

## 2015-10-18 NOTE — Progress Notes (Signed)
Patient received education resource, including the self-management goal and tool. Patient verbalized understanding. 

## 2015-10-18 NOTE — Patient Instructions (Addendum)
You had the flu shot today  Please call if you change your mind about the referral to podiatry, or orthopedic such as at [facility], [facility], or [facility]  Please continue all other medications as before, and refills have been done if requested.  Please have the pharmacy call with any other refills you may need.  Please continue your efforts at being more active, low cholesterol diet, and weight control.  Please keep your appointments with your specialists as you may have planned  Please return in 6 months, or sooner if needed

## 2015-10-18 NOTE — Progress Notes (Signed)
Subjective:    Patient ID: [name] Bell, female    DOB: [birth year], 74 y.o.   MRN: [id number]  HPI  Here to f/u; overall doing ok,  Pt denies chest pain, increasing sob or doe, wheezing, orthopnea, PND, increased LE swelling, palpitations, dizziness or syncope.  Pt denies new neurological symptoms such as new headache, or facial or extremity weakness or numbness.  Pt denies polydipsia, polyuria, or low sugar episode.   Pt denies new neurological symptoms such as new headache, or facial or extremity weakness or numbness.   Pt states overall good compliance with meds, mostly trying to follow appropriate diet, with wt overall stable,  but little exercise however.  Does have ongoing right foot pain, no swelling, has seen several local ortho in past. Walks 6K steps per day, has ongoing right foot pain with nubmness to below the ankle. No other complaints. Denies worsening depressive symptoms, suicidal ideation, or panic Past Medical History  Diagnosis Date  . 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 ALLERGIC RHINITIS 01/24/2008  . ANEMIA-IRON DEFICIENCY 01/24/2008    pt denied  . ANXIETY 02/26/2009  . BACK PAIN 09/30/2010  . DIZZINESS 02/28/2010  . ELEVATED BLOOD PRESSURE WITHOUT DIAGNOSIS OF HYPERTENSION 02/26/2009  . FATIGUE 02/26/2009  . GERD 01/24/2008  . HYPERLIPIDEMIA 08/16/2008  . SHINGLES 11/06/2010  . TOE PAIN 09/30/2010  . VARICOSE VEINS, LOWER EXTREMITIES 08/16/2008  . 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 Cancer of central portion of female breast ([place]) 05/04/2015  . Breast cancer ([place])   . History of blood transfusion ectopic pregnancy   Past Surgical History  Procedure Laterality Date  . Tonsillectomy    . Ectopic pregnancy surgery      at 74 yo with salpingectomy  . S/p bilateral breasts implants  12/2007  . S/p lumbar surgury  july 2010  . Foot surgery Right     bunion, hammer toe.  x 2  . Breast lumpectomy Right 05/14/2015    Procedure: RIGHT BREAST LUMPECTOMY;  Surgeon:  Alphonsa Overall, MD;  Location: [facility];  Bell: General;  Laterality: Right;    reports that she has never smoked. She has never used smokeless tobacco. She reports that she drinks about 3.6 oz of alcohol per week. She reports that she does not use illicit drugs. family history includes Cancer in her father. No Known Allergies Current Outpatient Prescriptions on File Prior to Visit  Medication Sig Dispense Refill  . buPROPion (WELLBUTRIN XL) 150 MG 24 hr tablet Take 1 tablet (150 mg total) by mouth every morning. 30 tablet 1  . clonazePAM (KLONOPIN) 1 MG tablet Take 1 tablet (1 mg total) by mouth 2 (two) times daily as needed for anxiety. 60 tablet 5  . Coenzyme Q10 (CO Q 10 PO) Take 1 capsule by mouth daily.    . Glucosamine HCl (GLUCOSAMINE PO) Take 1 tablet by mouth daily.    . hydrochlorothiazide (HYDRODIURIL) 25 MG tablet Take 25 mg by mouth daily as needed (for fluid).    . Multiple Vitamins-Minerals (HAIR/SKIN/NAILS PO) Take 1 tablet by mouth daily.     No current facility-administered medications on file prior to visit.     Review of Systems  Constitutional: Negative for unusual diaphoresis or night sweats HENT: Negative for ringing in ear or discharge Eyes: Negative for double vision or worsening visual disturbance.  Respiratory: Negative for choking and stridor.   Gastrointestinal: Negative for vomiting or other signifcant bowel change Genitourinary: Negative for hematuria or change in  urine volume.  Musculoskeletal: Negative for other MSK pain or swelling Skin: Negative for color change and worsening wound.  Neurological: Negative for tremors and numbness other than noted  Psychiatric/Behavioral: Negative for decreased concentration or agitation other than above       Objective:   Physical Exam BP 120/78 mmHg  Pulse 60  Temp(Src) 97.6 F (36.4 C)  Wt 171 lb (77.565 kg)  SpO2 96% VS noted,  Constitutional: Pt appears in no significant distress HENT: Head: NCAT.    Right Ear: External ear normal.  Left Ear: External ear normal.  Eyes: . Pupils are equal, round, and reactive to light. Conjunctivae and EOM are normal Neck: Normal range of motion. Neck supple.  Cardiovascular: Normal rate and regular rhythm.   Pulmonary/Chest: Effort normal and breath sounds without rales or wheezing.  Right foot with post surgical 2nd toe with a lower aspect in relation to other toes but no ulcers, swelling, erythema, dorsalis pedis 1-2+ bilat Neurological: Pt is alert. Not confused , motor grossly intact Skin: Skin is warm. No rash, no LE edema Psychiatric: Pt behavior is normal. No agitation.     Assessment & Plan:

## 2015-10-20 NOTE — Assessment & Plan Note (Signed)
stable overall by history and exam, recent data reviewed with pt, and pt to continue medical treatment as before,  to f/u any worsening symptoms or concerns Lab Results  Component Value Date   HGBA1C 5.4 02/06/2015    

## 2015-10-20 NOTE — Assessment & Plan Note (Signed)
Chronic persistent, declines further referral at this time,  to f/u any worsening symptoms or concerns

## 2015-10-20 NOTE — Assessment & Plan Note (Signed)
stable overall by history and exam, recent data reviewed with pt, and pt to continue medical treatment as before,  to f/u any worsening symptoms or concerns Lab Results  Component Value Date   WBC 7.9 07/30/2015   HGB 14.0 07/30/2015   HCT 41.5 07/30/2015   PLT 359.0 07/30/2015   GLUCOSE 93 05/14/2015   CHOL 231* 03/27/2015   TRIG 70.0 03/27/2015   HDL 77.40 03/27/2015   LDLDIRECT 128.5 07/04/2013   LDLCALC 140* 03/27/2015   ALT 21 05/09/2015   AST 19 05/09/2015   NA 135 05/14/2015   K 3.8 05/14/2015   CL 100* 05/14/2015   CREATININE 0.81 05/14/2015   BUN 6 05/14/2015   CO2 25 05/14/2015   TSH 1.77 03/27/2015   INR 0.9 07/17/2009   HGBA1C 5.4 02/06/2015    

## 2015-11-12 NOTE — Telephone Encounter (Signed)
Error

## 2015-11-15 DIAGNOSIS — H2513 Age-related nuclear cataract, bilateral: Secondary | ICD-10-CM | POA: Diagnosis not present

## 2015-11-16 DIAGNOSIS — Z853 Personal history of malignant neoplasm of breast: Secondary | ICD-10-CM | POA: Diagnosis not present

## 2015-12-06 DIAGNOSIS — M79671 Pain in right foot: Secondary | ICD-10-CM | POA: Diagnosis not present

## 2015-12-21 ENCOUNTER — Ambulatory Visit (INDEPENDENT_AMBULATORY_CARE_PROVIDER_SITE_OTHER): Payer: Medicare Other | Admitting: Neurology

## 2015-12-21 VITALS — Temp 97.6°F

## 2015-12-21 DIAGNOSIS — G243 Spasmodic torticollis: Secondary | ICD-10-CM | POA: Diagnosis not present

## 2015-12-21 MED ORDER — ONABOTULINUMTOXINA 100 UNITS IJ SOLR
300.0000 [IU] | Freq: Once | INTRAMUSCULAR | Status: AC
  Administered 2015-12-21: 300 [IU] via INTRAMUSCULAR

## 2015-12-21 MED ORDER — CLONAZEPAM 1 MG PO TABS: 1.0000 mg | ORAL_TABLET | Freq: Two times a day (BID) | ORAL | Status: DC | PRN

## 2015-12-21 NOTE — Procedures (Signed)
[facility]   Procedure Note Botox  Attending: Dr. Wells Guiles Tat  Preoperative Diagnosis(es): Cervical Dystonia  Result History  Onset of effect: 2 days Duration of Benefit: few weeks ago started to wear off as is little over due for botox Adverse Effects: no dysphagia last time   Consent obtained from: The patient  Benefits discussed included, but were not limited to decreased muscle tightness, increased joint range of motion, and decreased pain.  Risk discussed included, but were not limited pain and discomfort, bleeding, bruising, excessive weakness, venous thrombosis, muscle atrophy and dysphagia.  A copy of the patient medication guide was given to the patient which explains the blackbox warning.  Informed consent was obtained  Patients identity and treatment sites confirmed yes.     Details of Procedure: Skin was cleaned with alcohol.  A 30 gauge, 1/2 inch needle was introduced to the target muscle (except splenius capitus, posterior approach, where 27 inch, 1 1/2 gauge needle was used).  Prior to injection, the needle plunger was aspirated to make sure the needle was not within a blood vessel.  There was no blood retrieved on aspiration.    Following is a summary of the muscles injected  And the amount of Botulinum toxin used:   Dilution 0.9% preservative free saline mixed with 100 u Botox type A to make 10 U per 0.1cc  Injections  Location Left  Right Units Number of sites        Sternocleidomastoid 60+30 0 90 1  Splenius Capitus, posterior approach 0 100 100 1  Splenius Capitus, lateral approach 0 60 60 1  Levator Scapulae 30  30 0  Trapezius            TOTAL UNITS:   280    Agent: Botulinum Type A ( Onobotulinum Toxin type A ). 3 vials of Botox were used, containing 100 units and freshly diluted with 1 mL of sterile, non-preserved saline    Total injected (Units): 280  Total wasted (Units): 20 units   Pt tolerated procedure well without complications.    Reinjection is anticipated in 3 months.

## 2016-01-14 DIAGNOSIS — L570 Actinic keratosis: Secondary | ICD-10-CM | POA: Diagnosis not present

## 2016-02-14 ENCOUNTER — Other Ambulatory Visit: Payer: Self-pay | Admitting: Internal Medicine

## 2016-02-14 MED ORDER — CEPHALEXIN 500 MG PO CAPS: 500.0000 mg | ORAL_CAPSULE | Freq: Four times a day (QID) | ORAL | Status: DC

## 2016-02-25 NOTE — Assessment & Plan Note (Signed)
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 Breast exam 08/28/2015: Normal 2. Mammogram to be done once a year to be done in May 2017  Cellulitis involving the toe, nasal area required 4 sets of antibiotics and finally it cleared. Because of this, she canceled her trip to [place]. She plans to go and meet her family next year instead.  Return to clinic in 6 months for follow-up

## 2016-02-26 ENCOUNTER — Telehealth: Payer: Self-pay | Admitting: Hematology and Oncology

## 2016-02-26 ENCOUNTER — Encounter: Payer: Self-pay | Admitting: Hematology and Oncology

## 2016-02-26 ENCOUNTER — Ambulatory Visit (HOSPITAL_BASED_OUTPATIENT_CLINIC_OR_DEPARTMENT_OTHER): Payer: Medicare Other | Admitting: Hematology and Oncology

## 2016-02-26 ENCOUNTER — Telehealth: Payer: Self-pay | Admitting: Neurology

## 2016-02-26 VITALS — BP 142/65 | HR 58 | Temp 97.5°F | Resp 17 | Ht 64.0 in | Wt 173.0 lb

## 2016-02-26 DIAGNOSIS — Z853 Personal history of malignant neoplasm of breast: Secondary | ICD-10-CM | POA: Diagnosis not present

## 2016-02-26 DIAGNOSIS — C50111 Malignant neoplasm of central portion of right female breast: Secondary | ICD-10-CM

## 2016-02-26 NOTE — Progress Notes (Signed)
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)   04/19/2015 Mammogram Right breast: calcifications warrant further evaluation with magnified views.   04/30/2015 Breast US Right breast: 2.2 cm group of slightly suspicious calcifications located within the superior subareolar portion of the breast, bx recommended   05/01/2015 Initial Biopsy Right breast needle core bx: DCIS, grade 1, ER+ (90%), PR+ (90%)   05/08/2015 Breast MRI Right breast postbiopsy changes within the subareolar portion extends to involve the nipple together with the enhancement and biopsy change 3 x 2.2 x 2 cm size; worrisome calcifications measure 2.2 cm   05/08/2015 Clinical Stage Stage 0: Tis N0   05/14/2015 Definitive Surgery Right lumpectomy Lucia Gaskins): Low grade DCIS, 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    CHIEF COMPLIANT:  Follow-up of breast cancer  INTERVAL HISTORY: [name] Bell is a  75 year old with above-mentioned history of right breast cancer currently on surveillance. She refuses antiestrogen therapy. She denies any lumps or nodules in the breasts. Her infection of the toes has healed completely. She is looking forward to going to [place] in the summer.  REVIEW OF SYSTEMS:   Constitutional: Denies fevers, chills or abnormal weight loss Eyes: Denies blurriness of vision Ears, nose, mouth, throat, and face: Denies mucositis or sore throat Respiratory: Denies cough, dyspnea or wheezes Cardiovascular: Denies palpitation, chest discomfort Gastrointestinal:  Denies nausea, heartburn or change in bowel habits Skin: Denies abnormal skin rashes Lymphatics: Denies new lymphadenopathy or easy bruising Neurological:Denies numbness, tingling or new weaknesses Behavioral/Psych: Mood is stable, no new changes  Extremities: No lower extremity edema Breast:  denies any pain or lumps or nodules in either breasts All other systems were reviewed with the patient and are negative.  I have reviewed the past medical history, past surgical history, social history and family history with the patient and they are unchanged from previous note.  ALLERGIES:  has No Known Allergies.  MEDICATIONS:  Current Outpatient Prescriptions  Medication Sig Dispense Refill  . clonazePAM (KLONOPIN) 1 MG tablet Take 1 tablet (1 mg total) by mouth 2 (two) times daily as needed for anxiety. 60 tablet 5  . clonazePAM (KLONOPIN) 1 MG tablet Take 1 tablet (1 mg total) by mouth 2 (two) times daily as needed for anxiety. 60 tablet 5  . Coenzyme Q10 (CO Q 10 PO) Take  1 capsule by mouth daily.    . Glucosamine HCl (GLUCOSAMINE PO) Take 1 tablet by mouth daily.    . hydrochlorothiazide (HYDRODIURIL) 25 MG tablet Take 25 mg by mouth daily as needed (for fluid).    . Multiple Vitamins-Minerals  (HAIR/SKIN/NAILS PO) Take 1 tablet by mouth daily.    . flunisolide (NASALIDE) 25 MCG/ACT (0.025%) SOLN Place 2 sprays into the nose 2 (two) times daily. Reported on 02/26/2016     No current facility-administered medications for this visit.    PHYSICAL EXAMINATION: ECOG PERFORMANCE STATUS: 0 - Asymptomatic  Filed Vitals:   02/26/16 1123  BP: 142/65  Pulse: 58  Temp: 97.5 F (36.4 C)  Resp: 17   Filed Weights   02/26/16 1123  Weight: 173 lb (78.472 kg)    GENERAL:alert, no distress and comfortable SKIN: skin color, texture, turgor are normal, no rashes or significant lesions EYES: normal, Conjunctiva are pink and non-injected, sclera clear OROPHARYNX:no exudate, no erythema and lips, buccal mucosa, and tongue normal  NECK: supple, thyroid normal size, non-tender, without nodularity LYMPH:  no palpable lymphadenopathy in the cervical, axillary or inguinal LUNGS: clear to auscultation and percussion with normal breathing effort HEART: regular rate & rhythm and no murmurs and no lower extremity edema ABDOMEN:abdomen soft, non-tender and normal bowel sounds MUSCULOSKELETAL:no cyanosis of digits and no clubbing  NEURO: alert & oriented x 3 with fluent speech, no focal motor/sensory deficits EXTREMITIES: No lower extremity edema BREAST: No palpable masses or nodules in either right or left breasts. No palpable axillary supraclavicular or infraclavicular adenopathy no breast tenderness or nipple discharge. (exam performed in the presence of a chaperone)  LABORATORY DATA:  I have reviewed the data as listed   Chemistry      Component Value Date/Time   NA 135 05/14/2015 1204   NA 141 05/09/2015 0817   K 3.8 05/14/2015 1204   K 4.3 05/09/2015 0817   CL 100* 05/14/2015 1204   CO2 25 05/14/2015 1204   CO2 25 05/09/2015 0817   BUN 6 05/14/2015 1204   BUN 11.3 05/09/2015 0817   CREATININE 0.81 05/14/2015 1204   CREATININE 0.8 05/09/2015 0817      Component Value Date/Time    CALCIUM 8.9 05/14/2015 1204   CALCIUM 9.0 05/09/2015 0817   ALKPHOS 60 05/09/2015 0817   ALKPHOS 53 07/04/2013 1156   AST 19 05/09/2015 0817   AST 17 07/04/2013 1156   ALT 21 05/09/2015 0817   ALT 18 07/04/2013 1156   BILITOT 0.51 05/09/2015 0817   BILITOT 0.7 07/04/2013 1156       Lab Results  Component Value Date   WBC 7.9 07/30/2015   HGB 14.0 07/30/2015   HCT 41.5 07/30/2015   MCV 93.1 07/30/2015   PLT 359.0 07/30/2015   NEUTROABS 4.8 07/30/2015   ASSESSMENT & PLAN:  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 Breast exam 08/28/2015: Normal 2. Mammogram to be done once a year to be done in May 2017  Cellulitis involving the toe, nasal area required 4 sets of antibiotics and finally it cleared. Because of this, she canceled her trip to [place]. She plans to go and meet her family next year instead.  Return to clinic in 1 yr for follow-up.    No orders of the  defined types were placed in this encounter.   The patient has a good understanding of the overall plan. she agrees with it. she will call with any problems that may develop before the next visit here.   Rulon Eisenmenger, MD 02/26/2016

## 2016-02-26 NOTE — Telephone Encounter (Signed)
Pat called and would like her clonazepam filled, but would like the Brand not generic. She will be out of the country from 3/7 to 3/23 and will run out of the med while away. If there are any problems with filling this med, please call her.

## 2016-02-26 NOTE — Telephone Encounter (Signed)
Scheduled patient appt per pof, avs report printed.  °

## 2016-02-26 NOTE — Telephone Encounter (Signed)
Spoke with patient and she is not picking up early, just needs a 90 day supply. Called pharmacy and changed RX to 90 day supply. Patient aware.

## 2016-03-21 ENCOUNTER — Encounter: Payer: Self-pay | Admitting: Neurology

## 2016-03-21 ENCOUNTER — Ambulatory Visit (INDEPENDENT_AMBULATORY_CARE_PROVIDER_SITE_OTHER): Payer: Medicare Other | Admitting: Neurology

## 2016-03-21 VITALS — Temp 97.2°F

## 2016-03-21 DIAGNOSIS — G243 Spasmodic torticollis: Secondary | ICD-10-CM

## 2016-03-21 MED ORDER — ONABOTULINUMTOXINA 100 UNITS IJ SOLR
280.0000 [IU] | Freq: Once | INTRAMUSCULAR | Status: AC
  Administered 2016-03-21: 280 [IU] via INTRAMUSCULAR

## 2016-03-21 NOTE — Procedures (Signed)
[facility]   Procedure Note Botox  Attending: Dr. Wells Guiles Tat  Preoperative Diagnosis(es): Cervical Dystonia  Result History  Onset of effect: 2 days Duration of Benefit: still working but has head tilt Adverse Effects: no dysphagia last time   Consent obtained from: The patient  Benefits discussed included, but were not limited to decreased muscle tightness, increased joint range of motion, and decreased pain.  Risk discussed included, but were not limited pain and discomfort, bleeding, bruising, excessive weakness, venous thrombosis, muscle atrophy and dysphagia.  A copy of the patient medication guide was given to the patient which explains the blackbox warning.  Informed consent was obtained  Patients identity and treatment sites confirmed yes.     Details of Procedure: Skin was cleaned with alcohol.  A 30 gauge, 1/2 inch needle was introduced to the target muscle (except splenius capitus, posterior approach, where 27 inch, 1 1/2 gauge needle was used).  Prior to injection, the needle plunger was aspirated to make sure the needle was not within a blood vessel.  There was no blood retrieved on aspiration.    Following is a summary of the muscles injected  And the amount of Botulinum toxin used:   Dilution 0.9% preservative free saline mixed with 100 u Botox type A to make 10 U per 0.1cc  Injections  Location Left  Right Units Number of sites        Sternocleidomastoid 60+30 0 90 1  Splenius Capitus, posterior approach 0 100 100 1  Splenius Capitus, lateral approach 0 60 60 1  Levator Scapulae 30  30 0  Trapezius            TOTAL UNITS:   280    Agent: Botulinum Type A ( Onobotulinum Toxin type A ). 3 vials of Botox were used, containing 100 units and freshly diluted with 1 mL of sterile, non-preserved saline    Total injected (Units): 280  Total wasted (Units): 0 units   Pt tolerated procedure well without complications.   Reinjection is anticipated in 3  months.

## 2016-04-01 DIAGNOSIS — M79671 Pain in right foot: Secondary | ICD-10-CM | POA: Diagnosis not present

## 2016-04-16 ENCOUNTER — Encounter: Payer: Self-pay | Admitting: Internal Medicine

## 2016-04-16 ENCOUNTER — Other Ambulatory Visit (INDEPENDENT_AMBULATORY_CARE_PROVIDER_SITE_OTHER): Payer: Medicare Other

## 2016-04-16 ENCOUNTER — Ambulatory Visit (INDEPENDENT_AMBULATORY_CARE_PROVIDER_SITE_OTHER): Payer: Medicare Other | Admitting: Internal Medicine

## 2016-04-16 ENCOUNTER — Ambulatory Visit (INDEPENDENT_AMBULATORY_CARE_PROVIDER_SITE_OTHER)
Admission: RE | Admit: 2016-04-16 | Discharge: 2016-04-16 | Disposition: A | Discharge status: Home / Self Care | Payer: Medicare Other | Source: Ambulatory Visit | Attending: Internal Medicine | Admitting: Internal Medicine

## 2016-04-16 VITALS — BP 118/72 | HR 73 | Temp 98.2°F | Resp 20 | Wt 166.0 lb

## 2016-04-16 DIAGNOSIS — R7302 Impaired glucose tolerance (oral): Secondary | ICD-10-CM

## 2016-04-16 DIAGNOSIS — R05 Cough: Secondary | ICD-10-CM

## 2016-04-16 DIAGNOSIS — E785 Hyperlipidemia, unspecified: Secondary | ICD-10-CM | POA: Diagnosis not present

## 2016-04-16 DIAGNOSIS — R058 Other specified cough: Secondary | ICD-10-CM

## 2016-04-16 LAB — CBC WITH DIFFERENTIAL/PLATELET
BASOS ABS: 0.1 10*3/uL (ref 0.0–0.1)
Basophils Relative: 0.6 % (ref 0.0–3.0)
EOS PCT: 2.3 % (ref 0.0–5.0)
Eosinophils Absolute: 0.2 10*3/uL (ref 0.0–0.7)
HCT: 41.1 % (ref 36.0–46.0)
HEMOGLOBIN: 14.1 g/dL (ref 12.0–15.0)
Lymphocytes Relative: 17.8 % (ref 12.0–46.0)
Lymphs Abs: 1.8 10*3/uL (ref 0.7–4.0)
MCHC: 34.4 g/dL (ref 30.0–36.0)
MCV: 90.6 fl (ref 78.0–100.0)
MONO ABS: 1.1 10*3/uL — AB (ref 0.1–1.0)
MONOS PCT: 10.8 % (ref 3.0–12.0)
Neutro Abs: 7 10*3/uL (ref 1.4–7.7)
Neutrophils Relative %: 68.5 % (ref 43.0–77.0)
Platelets: 374 10*3/uL (ref 150.0–400.0)
RBC: 4.54 Mil/uL (ref 3.87–5.11)
RDW: 13 % (ref 11.5–15.5)
WBC: 10.3 10*3/uL (ref 4.0–10.5)

## 2016-04-16 LAB — BASIC METABOLIC PANEL
BUN: 9 mg/dL (ref 6–23)
CALCIUM: 9.4 mg/dL (ref 8.4–10.5)
CO2: 30 mEq/L (ref 19–32)
Chloride: 100 mEq/L (ref 96–112)
Creatinine, Ser: 0.71 mg/dL (ref 0.40–1.20)
GFR: 85.79 mL/min (ref >60.00)
GLUCOSE: 92 mg/dL (ref 70–99)
Potassium: 4 mEq/L (ref 3.5–5.1)
Sodium: 136 mEq/L (ref 135–145)

## 2016-04-16 LAB — HEPATIC FUNCTION PANEL
ALBUMIN: 4.1 g/dL (ref 3.5–5.2)
ALT: 14 U/L (ref 0–35)
AST: 14 U/L (ref 0–37)
Alkaline Phosphatase: 63 U/L (ref 39–117)
Bilirubin, Direct: 0.1 mg/dL (ref 0.0–0.3)
TOTAL PROTEIN: 7.7 g/dL (ref 6.0–8.3)
Total Bilirubin: 0.5 mg/dL (ref 0.2–1.2)

## 2016-04-16 LAB — LIPID PANEL
CHOLESTEROL: 182 mg/dL (ref 0–200)
HDL: 61.8 mg/dL (ref >39.00)
LDL CALC: 106 mg/dL — AB (ref 0–99)
NonHDL: 120.01
TRIGLYCERIDES: 72 mg/dL (ref 0.0–149.0)
Total CHOL/HDL Ratio: 3
VLDL: 14.4 mg/dL (ref 0.0–40.0)

## 2016-04-16 LAB — TSH: TSH: 1.32 u[IU]/mL (ref 0.35–4.50)

## 2016-04-16 LAB — URINALYSIS, ROUTINE W REFLEX MICROSCOPIC
BILIRUBIN URINE: NEGATIVE
HGB URINE DIPSTICK: NEGATIVE
KETONES UR: NEGATIVE
NITRITE: NEGATIVE
RBC / HPF: NONE SEEN (ref 0–?)
Specific Gravity, Urine: <=1.005 — AB (ref 1.000–1.030)
TOTAL PROTEIN, URINE-UPE24: NEGATIVE
UROBILINOGEN UA: 0.2 (ref 0.0–1.0)
Urine Glucose: NEGATIVE
pH: 6.5 (ref 5.0–8.0)

## 2016-04-16 LAB — HEMOGLOBIN A1C: Hgb A1c MFr Bld: 5.7 % (ref 4.6–6.5)

## 2016-04-16 MED ORDER — HYDROCODONE-HOMATROPINE 5-1.5 MG/5ML PO SYRP: 5.0000 mL | ORAL_SOLUTION | Freq: Four times a day (QID) | ORAL | Status: DC | PRN

## 2016-04-16 MED ORDER — LEVOFLOXACIN 500 MG PO TABS: 500.0000 mg | ORAL_TABLET | Freq: Every day | ORAL | Status: DC

## 2016-04-16 NOTE — Progress Notes (Signed)
Subjective:    Patient ID: [name] Bell, female    DOB: 12-22-1941, 75 y.o.   MRN: [id number]  HPI   Here with acute onset mild to mod 1 wk ST, HA, general weakness and malaise, with prod cough greenish brownshi sputum, but Pt denies chest pain, increased sob or doe, wheezing, orthopnea, PND, increased LE swelling, palpitations, dizziness or syncope. Lost several lbs Wt Readings from Last 3 Encounters:  04/16/16 166 lb (75.297 kg)  02/26/16 173 lb (78.472 kg)  10/18/15 171 lb (77.565 kg)  Husband has been ill with cough as well last few wks intermittent.  Pt denies chest pain, increased sob or doe, wheezing, orthopnea, PND, increased LE swelling, palpitations, dizziness or syncope.except for the above. Pt denies new neurological symptoms such as new headache, or facial or extremity weakness or numbness   Pt denies polydipsia, polyuria Past Medical History  Diagnosis Date  . 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 ALLERGIC RHINITIS 01/24/2008  . ANEMIA-IRON DEFICIENCY 01/24/2008    pt denied  . ANXIETY 02/26/2009  . BACK PAIN 09/30/2010  . DIZZINESS 02/28/2010  . ELEVATED BLOOD PRESSURE WITHOUT DIAGNOSIS OF HYPERTENSION 02/26/2009  . FATIGUE 02/26/2009  . GERD 01/24/2008  . HYPERLIPIDEMIA 08/16/2008  . SHINGLES 11/06/2010  . TOE PAIN 09/30/2010  . VARICOSE VEINS, LOWER EXTREMITIES 08/16/2008  . 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 Cancer of central portion of female breast ([place]) 05/04/2015  . Breast cancer ([place])   . History of blood transfusion ectopic pregnancy   Past Surgical History  Procedure Laterality Date  . Tonsillectomy    . Ectopic pregnancy surgery      at 75 yo with salpingectomy  . S/p bilateral breasts implants  12/2007  . S/p lumbar surgury  july 2010  . Foot surgery Right     bunion, hammer toe.  x 2  . Breast lumpectomy Right 05/14/2015    Procedure: RIGHT BREAST LUMPECTOMY;  Surgeon: Alphonsa Overall, MD;  Location: [facility];  Bell: General;  Laterality: Right;    reports that she has never smoked. She has never used smokeless tobacco. She reports that she drinks about 3.6 oz of alcohol per week. She reports that she does not use illicit drugs. family history includes Cancer in her father. No Known Allergies Current Outpatient Prescriptions on File Prior to Visit  Medication Sig Dispense Refill  . clonazePAM (KLONOPIN) 1 MG tablet Take 1 tablet (1 mg total) by mouth 2 (two) times daily as needed for anxiety. 60 tablet 5  . Coenzyme Q10 (CO Q 10 PO) Take 1 capsule by mouth daily.    . flunisolide (NASALIDE) 25 MCG/ACT (0.025%) SOLN Place 2 sprays into the nose 2 (two) times daily. Reported on 02/26/2016    . Glucosamine HCl (GLUCOSAMINE PO) Take 1 tablet by mouth daily.    . hydrochlorothiazide (HYDRODIURIL) 25 MG tablet Take 25 mg by mouth daily as needed (for fluid).    . Multiple Vitamins-Minerals (HAIR/SKIN/NAILS PO) Take 1 tablet by mouth daily.     No current facility-administered medications on file prior to visit.    Review of Systems  Constitutional: Negative for unusual diaphoresis or night sweats HENT: Negative for ear swelling or discharge Eyes: Negative for worsening visual haziness  Respiratory: Negative for choking and stridor.   Gastrointestinal: Negative for distension or worsening eructation Genitourinary: Negative for retention or change in urine volume.  Musculoskeletal: Negative for other MSK pain or swelling Skin: Negative for color change and worsening wound  Neurological: Negative for tremors and numbness other than noted  Psychiatric/Behavioral: Negative for decreased concentration or agitation other than above       Objective:   Physical Exam BP 118/72 mmHg  Pulse 73  Temp(Src) 98.2 F (36.8 C) (Oral)  Resp 20  Wt 166 lb (75.297 kg)  SpO2 95% VS noted,  Mild ill Constitutional: Pt appears in no apparent distress HENT: Head: NCAT.  Right Ear: External ear normal.  Left Ear: External ear normal.  Bilat tm's with  mild erythema.  Max sinus areas non tender.  Pharynx with mild erythema, no exudate Eyes: . Pupils are equal, round, and reactive to light. Conjunctivae and EOM are normal Neck: Normal range of motion. Neck supple.  Cardiovascular: Normal rate and regular rhythm.   Pulmonary/Chest: Effort normal and breath sounds decreased without rales or wheezing.  Neurological: Pt is alert. Not confused , motor grossly intact Skin: Skin is warm. No rash, no LE edema Psychiatric: Pt behavior is normal. No agitation.        Assessment & Plan:

## 2016-04-16 NOTE — Patient Instructions (Signed)
You had the antibiotic shot today (rocephin)  Please take all new medication as prescribed  - the antibiotic, and cough medicine if needed  Please continue all other medications as before, and refills have been done if requested.  Please have the pharmacy call with any other refills you may need.  Please continue your efforts at being more active, low cholesterol diet, and weight control.  You are otherwise up to date with prevention measures today.  Please keep your appointments with your specialists as you may have planned  Please go to the XRAY Department in the Basement (go straight as you get off the elevator) for the x-ray testing  Please go to the LAB in the Basement (turn left off the elevator) for the tests to be done today  You will be contacted by phone if any changes need to be made immediately.  Otherwise, you will receive a letter about your results with an explanation, but please check with MyChart first.  Please remember to sign up for MyChart if you have not done so, as this will be important to you in the future with finding out test results, communicating by private email, and scheduling acute appointments online when needed.  Please return in 6 months, or sooner if needed

## 2016-04-16 NOTE — Progress Notes (Signed)
Pre visit review using our clinic review tool, if applicable. No additional management support is needed unless otherwise documented below in the visit note. 

## 2016-04-18 NOTE — Assessment & Plan Note (Addendum)
Mild to mod, c/w bronchitis vs pna, for cxr, also for antibx course,  to f/u any worsening symptoms or concerns

## 2016-04-18 NOTE — Assessment & Plan Note (Signed)
stable overall by history and exam, recent data reviewed with pt, and pt to continue medical treatment as before,  to f/u any worsening symptoms or concerns Lab Results  Component Value Date   HGBA1C 5.7 04/16/2016    

## 2016-04-18 NOTE — Assessment & Plan Note (Signed)
stable overall by history and exam, recent data reviewed with pt, and pt to continue medical treatment as before,  to f/u any worsening symptoms or concerns Lab Results  Component Value Date   LDLCALC 106* 04/16/2016   For f/u labs, goal ldl < 100

## 2016-04-30 ENCOUNTER — Telehealth: Payer: Self-pay | Admitting: *Deleted

## 2016-04-30 MED ORDER — AZITHROMYCIN 250 MG PO TABS: ORAL_TABLET | ORAL | Status: DC

## 2016-04-30 MED ORDER — HYDROCODONE-HOMATROPINE 5-1.5 MG/5ML PO SYRP: 5.0000 mL | ORAL_SOLUTION | Freq: Four times a day (QID) | ORAL | Status: DC | PRN

## 2016-04-30 NOTE — Telephone Encounter (Signed)
Ok for zpack and cough med refill - Done hardcopy to [employer] to fax to pharmacy nearer to pt current location

## 2016-04-30 NOTE — Telephone Encounter (Signed)
Left msg on triage stating saw md about 2 wks ago for cough. Was given shot & antibiotic to take and she has completed them she states that she is starting to feel like she was before. Currently in [place] and want be back until this weekend, but the cough has started back mainly at night, headache, and very tired. Requesting md recommendations...[name]

## 2016-05-01 ENCOUNTER — Telehealth: Payer: Self-pay

## 2016-05-01 MED ORDER — AZITHROMYCIN 250 MG PO TABS: 250.0000 mg | ORAL_TABLET | ORAL | Status: DC

## 2016-05-01 NOTE — Telephone Encounter (Signed)
Notified pt w/MD response. Verified which pharmacy she would like rx to go to she is requesting med to be sent to [facility] @ [phone]. Antibiotic sent electronically faxed rx for cough med...[name]

## 2016-05-01 NOTE — Telephone Encounter (Signed)
[facility] called and said they can not take a fax for HYDROcodone-homatropine [facility]) 5-1.5 MG/5ML syrup [id number]  You need to calling in or fax the pearls for the cough. If patients wants that cough syrup she would have to bring in a hard copy.. Please follow up

## 2016-05-07 ENCOUNTER — Encounter: Payer: Self-pay | Admitting: Internal Medicine

## 2016-05-07 ENCOUNTER — Ambulatory Visit (INDEPENDENT_AMBULATORY_CARE_PROVIDER_SITE_OTHER): Payer: Medicare Other | Admitting: Internal Medicine

## 2016-05-07 VITALS — BP 130/70 | HR 58 | Temp 97.7°F | Resp 20 | Wt 170.0 lb

## 2016-05-07 DIAGNOSIS — H9203 Otalgia, bilateral: Secondary | ICD-10-CM

## 2016-05-07 DIAGNOSIS — R059 Cough, unspecified: Secondary | ICD-10-CM | POA: Insufficient documentation

## 2016-05-07 DIAGNOSIS — R079 Chest pain, unspecified: Secondary | ICD-10-CM

## 2016-05-07 DIAGNOSIS — R5383 Other fatigue: Secondary | ICD-10-CM | POA: Insufficient documentation

## 2016-05-07 DIAGNOSIS — R05 Cough: Secondary | ICD-10-CM

## 2016-05-07 DIAGNOSIS — H9209 Otalgia, unspecified ear: Secondary | ICD-10-CM | POA: Insufficient documentation

## 2016-05-07 MED ORDER — AZITHROMYCIN 250 MG PO TABS: ORAL_TABLET | ORAL | Status: DC

## 2016-05-07 MED ORDER — HYDROCODONE-HOMATROPINE 5-1.5 MG/5ML PO SYRP: 5.0000 mL | ORAL_SOLUTION | Freq: Four times a day (QID) | ORAL | Status: DC | PRN

## 2016-05-07 NOTE — Assessment & Plan Note (Signed)
Mod per pt, most liekly related to acute infectious illness, exam o/w benign, declines furhter labs today, to f/u any worsening symptoms or concerns

## 2016-05-07 NOTE — Progress Notes (Signed)
Subjective:    Patient ID: [name] Bell, female    DOB: February 05, 1941, 75 y.o.   MRN: [id number]  HPI  Here after improving non prod cough illness, then worsening again it seems with non prod cough , bilat costal margin pain especially in the AM after coughing all night, lack of sleep, marked fatigue, as well as bilat ear pain worse with [place] last wk (she and husband with property there).   Pt denies fever, wt loss, night sweats, loss of appetite, or other constitutional sympttoms, just doesn't seem to be bouncing back as expected.,   Overall good compliance with treatment, and good medicine tolerability.  Shower with steam did not help yesterday,  Nothing else seems to make better or worse.  Has been feeling warm but no high fevers. No wheezing she is aware  Past Medical History  Diagnosis Date  . 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 ALLERGIC RHINITIS 01/24/2008  . ANEMIA-IRON DEFICIENCY 01/24/2008    pt denied  . ANXIETY 02/26/2009  . BACK PAIN 09/30/2010  . DIZZINESS 02/28/2010  . ELEVATED BLOOD PRESSURE WITHOUT DIAGNOSIS OF HYPERTENSION 02/26/2009  . FATIGUE 02/26/2009  . GERD 01/24/2008  . HYPERLIPIDEMIA 08/16/2008  . SHINGLES 11/06/2010  . TOE PAIN 09/30/2010  . VARICOSE VEINS, LOWER EXTREMITIES 08/16/2008  . 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 Cancer of central portion of female breast ([place]) 05/04/2015  . Breast cancer ([place])   . History of blood transfusion ectopic pregnancy   Past Surgical History  Procedure Laterality Date  . Tonsillectomy    . Ectopic pregnancy surgery      at 75 yo with salpingectomy  . S/p bilateral breasts implants  12/2007  . S/p lumbar surgury  july 2010  . Foot surgery Right     bunion, hammer toe.  x 2  . Breast lumpectomy Right 05/14/2015    Procedure: RIGHT BREAST LUMPECTOMY;  Surgeon: Alphonsa Overall, MD;  Location: [facility];  Bell: General;  Laterality: Right;    reports that she has never smoked. She has never used smokeless  tobacco. She reports that she drinks about 3.6 oz of alcohol per week. She reports that she does not use illicit drugs. family history includes Cancer in her father. No Known Allergies Current Outpatient Prescriptions on File Prior to Visit  Medication Sig Dispense Refill  . clonazePAM (KLONOPIN) 1 MG tablet Take 1 tablet (1 mg total) by mouth 2 (two) times daily as needed for anxiety. 60 tablet 5  . Coenzyme Q10 (CO Q 10 PO) Take 1 capsule by mouth daily.    . flunisolide (NASALIDE) 25 MCG/ACT (0.025%) SOLN Place 2 sprays into the nose 2 (two) times daily. Reported on 02/26/2016    . Glucosamine HCl (GLUCOSAMINE PO) Take 1 tablet by mouth daily.    . hydrochlorothiazide (HYDRODIURIL) 25 MG tablet Take 25 mg by mouth daily as needed (for fluid).    . Multiple Vitamins-Minerals (HAIR/SKIN/NAILS PO) Take 1 tablet by mouth daily.     No current facility-administered medications on file prior to visit.   Review of Systems  Constitutional: Negative for unusual diaphoresis or night sweats HENT: Negative for ear swelling or discharge Eyes: Negative for worsening visual haziness  Respiratory: Negative for choking and stridor.   Gastrointestinal: Negative for distension or worsening eructation Genitourinary: Negative for retention or change in urine volume.  Musculoskeletal: Negative for other MSK pain or swelling Skin: Negative for color change and worsening wound Neurological: Negative for  tremors and numbness other than noted  Psychiatric/Behavioral: Negative for decreased concentration or agitation other than above       Objective:   Physical Exam BP 130/70 mmHg  Pulse 58  Temp(Src) 97.7 F (36.5 C) (Oral)  Resp 20  Wt 170 lb (77.111 kg)  SpO2 95% VS noted, fatigued appearing Constitutional: Pt appears in no apparent distress HENT: Head: NCAT.  Right Ear: External ear normal.  Left Ear: External ear normal.  Bilat tm's with mild erythema.  Max sinus areas non tender.  Pharynx with  mild erythema, no exudate Eyes: . Pupils are equal, round, and reactive to light. Conjunctivae and EOM are normal Neck: Normal range of motion. Neck supple.  Cardiovascular: Normal rate and regular rhythm.   Pulmonary/Chest: Effort normal and breath sounds without rales or wheezing.  Some tender areas to bilat costal margins Neurological: Pt is alert. Not confused , motor grossly intact Skin: Skin is warm. No rash, no LE edema Psychiatric: Pt behavior is normal. No agitation.   Most recent cxr -   CLINICAL DATA: 75 year old female with a history of cough and chest pain for 12 days  EXAM: CHEST - 2 VIEW  COMPARISON: 11/12/2010  FINDINGS: Cardiomediastinal silhouette projects within normal limits in size and contour. Chronic lung changes. No confluent airspace disease, pneumothorax, or pleural effusion.  No displaced fracture.  Unremarkable appearance of the upper abdomen.  IMPRESSION: No radiographic evidence of acute cardiopulmonary disease.  Signed,  Dulcy Fanny. Earleen Newport, DO  [facility]  [facility]   Electronically Signed  By: Corrie Mckusick D.O.  On: 04/16/2016 12:02          Assessment & Plan:

## 2016-05-07 NOTE — Progress Notes (Signed)
Pre visit review using our clinic review tool, if applicable. No additional management support is needed unless otherwise documented below in the visit note. 

## 2016-05-07 NOTE — Patient Instructions (Signed)
Please take all new medication as prescribed - the antibiotic, and cough medicine  You can also take Mucinex (or it's generic off brand) for congestion, and tylenol as needed for pain.  Please continue all other medications as before, and refills have been done if requested.  Please have the pharmacy call with any other refills you may need.  Please keep your appointments with your specialists as you may have planned    

## 2016-05-07 NOTE — Assessment & Plan Note (Signed)
Seems straightforward related to reproducible bilat costal margin pain, c/w msk - for tylenol prn, will hold on repeat imaging for now, no suspicion for cardiac at this time

## 2016-05-07 NOTE — Assessment & Plan Note (Signed)
Hx and exam most c/w bilat eustachian dysfx, for mucinex otc prn,  to f/u any worsening symptoms or concerns

## 2016-05-07 NOTE — Assessment & Plan Note (Signed)
Improved, then worse again, ? Etiology, ? New atypical pna vs other, for zpack x 1, cough med prn, exam o/w ok, will hold on repeat imaging cxr at this time,  to f/u any worsening symptoms or concerns

## 2016-05-12 DIAGNOSIS — M2022 Hallux rigidus, left foot: Secondary | ICD-10-CM | POA: Diagnosis not present

## 2016-05-12 DIAGNOSIS — M19172 Post-traumatic osteoarthritis, left ankle and foot: Secondary | ICD-10-CM | POA: Diagnosis not present

## 2016-05-20 DIAGNOSIS — M2021 Hallux rigidus, right foot: Secondary | ICD-10-CM | POA: Diagnosis not present

## 2016-05-20 DIAGNOSIS — M19071 Primary osteoarthritis, right ankle and foot: Secondary | ICD-10-CM | POA: Diagnosis not present

## 2016-05-20 DIAGNOSIS — G8918 Other acute postprocedural pain: Secondary | ICD-10-CM | POA: Diagnosis not present

## 2016-05-20 DIAGNOSIS — L600 Ingrowing nail: Secondary | ICD-10-CM | POA: Diagnosis not present

## 2016-05-20 DIAGNOSIS — M13871 Other specified arthritis, right ankle and foot: Secondary | ICD-10-CM | POA: Diagnosis not present

## 2016-05-29 HISTORY — PX: FOOT SURGERY: SHX648

## 2016-06-04 DIAGNOSIS — Z4789 Encounter for other orthopedic aftercare: Secondary | ICD-10-CM | POA: Diagnosis not present

## 2016-06-10 DIAGNOSIS — Z9889 Other specified postprocedural states: Secondary | ICD-10-CM | POA: Diagnosis not present

## 2016-06-10 DIAGNOSIS — R269 Unspecified abnormalities of gait and mobility: Secondary | ICD-10-CM | POA: Diagnosis not present

## 2016-06-10 DIAGNOSIS — M19172 Post-traumatic osteoarthritis, left ankle and foot: Secondary | ICD-10-CM | POA: Diagnosis not present

## 2016-06-10 DIAGNOSIS — M79671 Pain in right foot: Secondary | ICD-10-CM | POA: Diagnosis not present

## 2016-06-13 DIAGNOSIS — M79671 Pain in right foot: Secondary | ICD-10-CM | POA: Diagnosis not present

## 2016-06-13 DIAGNOSIS — M19172 Post-traumatic osteoarthritis, left ankle and foot: Secondary | ICD-10-CM | POA: Diagnosis not present

## 2016-06-13 DIAGNOSIS — R269 Unspecified abnormalities of gait and mobility: Secondary | ICD-10-CM | POA: Diagnosis not present

## 2016-06-13 DIAGNOSIS — Z9889 Other specified postprocedural states: Secondary | ICD-10-CM | POA: Diagnosis not present

## 2016-06-16 DIAGNOSIS — R269 Unspecified abnormalities of gait and mobility: Secondary | ICD-10-CM | POA: Diagnosis not present

## 2016-06-16 DIAGNOSIS — Z9889 Other specified postprocedural states: Secondary | ICD-10-CM | POA: Diagnosis not present

## 2016-06-16 DIAGNOSIS — M79671 Pain in right foot: Secondary | ICD-10-CM | POA: Diagnosis not present

## 2016-06-16 DIAGNOSIS — M19172 Post-traumatic osteoarthritis, left ankle and foot: Secondary | ICD-10-CM | POA: Diagnosis not present

## 2016-06-17 ENCOUNTER — Other Ambulatory Visit: Payer: Self-pay | Admitting: Hematology and Oncology

## 2016-06-17 ENCOUNTER — Ambulatory Visit
Admission: RE | Admit: 2016-06-17 | Discharge: 2016-06-17 | Disposition: A | Discharge status: Home / Self Care | Payer: Medicare Other | Source: Ambulatory Visit | Attending: Hematology and Oncology | Admitting: Hematology and Oncology

## 2016-06-17 DIAGNOSIS — C50111 Malignant neoplasm of central portion of right female breast: Secondary | ICD-10-CM

## 2016-06-17 DIAGNOSIS — R921 Mammographic calcification found on diagnostic imaging of breast: Secondary | ICD-10-CM

## 2016-06-19 DIAGNOSIS — R269 Unspecified abnormalities of gait and mobility: Secondary | ICD-10-CM | POA: Diagnosis not present

## 2016-06-19 DIAGNOSIS — Z9889 Other specified postprocedural states: Secondary | ICD-10-CM | POA: Diagnosis not present

## 2016-06-19 DIAGNOSIS — M19172 Post-traumatic osteoarthritis, left ankle and foot: Secondary | ICD-10-CM | POA: Diagnosis not present

## 2016-06-19 DIAGNOSIS — M79671 Pain in right foot: Secondary | ICD-10-CM | POA: Diagnosis not present

## 2016-06-20 ENCOUNTER — Ambulatory Visit
Admission: RE | Admit: 2016-06-20 | Discharge: 2016-06-20 | Disposition: A | Discharge status: Home / Self Care | Payer: Medicare Other | Source: Ambulatory Visit | Attending: Hematology and Oncology | Admitting: Hematology and Oncology

## 2016-06-20 ENCOUNTER — Ambulatory Visit (INDEPENDENT_AMBULATORY_CARE_PROVIDER_SITE_OTHER): Payer: Medicare Other | Admitting: Neurology

## 2016-06-20 ENCOUNTER — Encounter: Payer: Self-pay | Admitting: Neurology

## 2016-06-20 ENCOUNTER — Other Ambulatory Visit: Payer: Self-pay | Admitting: Hematology and Oncology

## 2016-06-20 DIAGNOSIS — C50111 Malignant neoplasm of central portion of right female breast: Secondary | ICD-10-CM

## 2016-06-20 DIAGNOSIS — R921 Mammographic calcification found on diagnostic imaging of breast: Secondary | ICD-10-CM

## 2016-06-20 DIAGNOSIS — G243 Spasmodic torticollis: Secondary | ICD-10-CM

## 2016-06-20 DIAGNOSIS — D0511 Intraductal carcinoma in situ of right breast: Secondary | ICD-10-CM | POA: Diagnosis not present

## 2016-06-20 DIAGNOSIS — N6011 Diffuse cystic mastopathy of right breast: Secondary | ICD-10-CM | POA: Diagnosis not present

## 2016-06-20 MED ORDER — ONABOTULINUMTOXINA 100 UNITS IJ SOLR
280.0000 [IU] | Freq: Once | INTRAMUSCULAR | Status: AC
  Administered 2016-06-20: 280 [IU] via INTRAMUSCULAR

## 2016-06-20 NOTE — Procedures (Signed)
[facility]   Procedure Note Botox  Attending: Dr. Wells Guiles Khristina Janota  Preoperative Diagnosis(es): Cervical Dystonia  Result History  Onset of effect: 2 days Duration of Benefit: still working but has head tilt Adverse Effects: no dysphagia last time   Consent obtained from: The patient  Benefits discussed included, but were not limited to decreased muscle tightness, increased joint range of motion, and decreased pain.  Risk discussed included, but were not limited pain and discomfort, bleeding, bruising, excessive weakness, venous thrombosis, muscle atrophy and dysphagia.  A copy of the patient medication guide was given to the patient which explains the blackbox warning.  Informed consent was obtained  Patients identity and treatment sites confirmed yes.     Details of Procedure: Skin was cleaned with alcohol.  A 30 gauge, 1/2 inch needle was introduced to the target muscle (except splenius capitus, posterior approach, where 27 inch, 1 1/2 gauge needle was used).  Prior to injection, the needle plunger was aspirated to make sure the needle was not within a blood vessel.  There was no blood retrieved on aspiration.    Following is a summary of the muscles injected  And the amount of Botulinum toxin used:   Dilution 0.9% preservative free saline mixed with 100 u Botox type A to make 10 U per 0.1cc  Injections  Location Left  Right Units Number of sites        Sternocleidomastoid 60+30 0 90 1  Splenius Capitus, posterior approach 0 100 100 1  Splenius Capitus, lateral approach 0 60 60 1  Levator Scapulae 30  30 0  Trapezius            TOTAL UNITS:   280    Agent: Botulinum Type A ( Onobotulinum Toxin type A ). 3 vials of Botox were used, containing 100 units and freshly diluted with 1 mL of sterile, non-preserved saline    Total injected (Units): 280  Total wasted (Units): 0 units   Pt tolerated procedure well without complications.   Reinjection is anticipated in 3  months.

## 2016-06-23 DIAGNOSIS — Z9889 Other specified postprocedural states: Secondary | ICD-10-CM | POA: Diagnosis not present

## 2016-06-23 DIAGNOSIS — R269 Unspecified abnormalities of gait and mobility: Secondary | ICD-10-CM | POA: Diagnosis not present

## 2016-06-23 DIAGNOSIS — M19172 Post-traumatic osteoarthritis, left ankle and foot: Secondary | ICD-10-CM | POA: Diagnosis not present

## 2016-06-23 DIAGNOSIS — M79671 Pain in right foot: Secondary | ICD-10-CM | POA: Diagnosis not present

## 2016-06-24 DIAGNOSIS — Z9882 Breast implant status: Secondary | ICD-10-CM | POA: Diagnosis not present

## 2016-06-24 DIAGNOSIS — D0591 Unspecified type of carcinoma in situ of right breast: Secondary | ICD-10-CM | POA: Diagnosis not present

## 2016-06-25 DIAGNOSIS — D0591 Unspecified type of carcinoma in situ of right breast: Secondary | ICD-10-CM | POA: Diagnosis not present

## 2016-06-25 DIAGNOSIS — Z853 Personal history of malignant neoplasm of breast: Secondary | ICD-10-CM | POA: Diagnosis not present

## 2016-06-27 DIAGNOSIS — M19172 Post-traumatic osteoarthritis, left ankle and foot: Secondary | ICD-10-CM | POA: Diagnosis not present

## 2016-06-27 DIAGNOSIS — M79671 Pain in right foot: Secondary | ICD-10-CM | POA: Diagnosis not present

## 2016-06-27 DIAGNOSIS — Z9889 Other specified postprocedural states: Secondary | ICD-10-CM | POA: Diagnosis not present

## 2016-06-27 DIAGNOSIS — R269 Unspecified abnormalities of gait and mobility: Secondary | ICD-10-CM | POA: Diagnosis not present

## 2016-06-30 DIAGNOSIS — Z4789 Encounter for other orthopedic aftercare: Secondary | ICD-10-CM | POA: Diagnosis not present

## 2016-06-30 DIAGNOSIS — R269 Unspecified abnormalities of gait and mobility: Secondary | ICD-10-CM | POA: Diagnosis not present

## 2016-06-30 DIAGNOSIS — M19172 Post-traumatic osteoarthritis, left ankle and foot: Secondary | ICD-10-CM | POA: Diagnosis not present

## 2016-06-30 DIAGNOSIS — Z9889 Other specified postprocedural states: Secondary | ICD-10-CM | POA: Diagnosis not present

## 2016-06-30 DIAGNOSIS — M79671 Pain in right foot: Secondary | ICD-10-CM | POA: Diagnosis not present

## 2016-07-02 DIAGNOSIS — Z4789 Encounter for other orthopedic aftercare: Secondary | ICD-10-CM | POA: Diagnosis not present

## 2016-07-03 DIAGNOSIS — Z9889 Other specified postprocedural states: Secondary | ICD-10-CM | POA: Diagnosis not present

## 2016-07-03 DIAGNOSIS — R269 Unspecified abnormalities of gait and mobility: Secondary | ICD-10-CM | POA: Diagnosis not present

## 2016-07-03 DIAGNOSIS — M19172 Post-traumatic osteoarthritis, left ankle and foot: Secondary | ICD-10-CM | POA: Diagnosis not present

## 2016-07-03 DIAGNOSIS — Z4789 Encounter for other orthopedic aftercare: Secondary | ICD-10-CM | POA: Diagnosis not present

## 2016-07-03 DIAGNOSIS — M79671 Pain in right foot: Secondary | ICD-10-CM | POA: Diagnosis not present

## 2016-07-05 ENCOUNTER — Other Ambulatory Visit: Payer: Self-pay | Admitting: Internal Medicine

## 2016-07-07 ENCOUNTER — Other Ambulatory Visit: Payer: Self-pay | Admitting: Neurology

## 2016-07-07 MED ORDER — CLONAZEPAM 1 MG PO TABS: 1.0000 mg | ORAL_TABLET | Freq: Two times a day (BID) | ORAL | Status: DC | PRN

## 2016-07-07 NOTE — Telephone Encounter (Signed)
Clonazepam refill requested. Per last office note- patient to remain on medication. Refill approved and sent to patient's pharmacy.   

## 2016-07-09 DIAGNOSIS — M79671 Pain in right foot: Secondary | ICD-10-CM | POA: Diagnosis not present

## 2016-07-09 DIAGNOSIS — Z4789 Encounter for other orthopedic aftercare: Secondary | ICD-10-CM | POA: Diagnosis not present

## 2016-07-09 DIAGNOSIS — Z9889 Other specified postprocedural states: Secondary | ICD-10-CM | POA: Diagnosis not present

## 2016-07-09 DIAGNOSIS — M19172 Post-traumatic osteoarthritis, left ankle and foot: Secondary | ICD-10-CM | POA: Diagnosis not present

## 2016-07-09 DIAGNOSIS — R269 Unspecified abnormalities of gait and mobility: Secondary | ICD-10-CM | POA: Diagnosis not present

## 2016-07-10 ENCOUNTER — Encounter: Payer: Self-pay | Admitting: Internal Medicine

## 2016-07-10 ENCOUNTER — Ambulatory Visit (INDEPENDENT_AMBULATORY_CARE_PROVIDER_SITE_OTHER): Payer: Medicare Other | Admitting: Internal Medicine

## 2016-07-10 VITALS — BP 130/70 | HR 77 | Temp 98.7°F | Resp 20 | Wt 169.0 lb

## 2016-07-10 DIAGNOSIS — R21 Rash and other nonspecific skin eruption: Secondary | ICD-10-CM

## 2016-07-10 DIAGNOSIS — F411 Generalized anxiety disorder: Secondary | ICD-10-CM | POA: Diagnosis not present

## 2016-07-10 DIAGNOSIS — R7302 Impaired glucose tolerance (oral): Secondary | ICD-10-CM

## 2016-07-10 MED ORDER — FLUOCINONIDE-E 0.05 % EX CREA: 1.0000 "application " | TOPICAL_CREAM | Freq: Two times a day (BID) | CUTANEOUS | Status: DC

## 2016-07-10 NOTE — Assessment & Plan Note (Signed)
stable overall by history and exam, recent data reviewed with pt, and pt to continue medical treatment as before,  to f/u any worsening symptoms or concerns Lab Results  Component Value Date   HGBA1C 5.7 04/16/2016    

## 2016-07-10 NOTE — Progress Notes (Signed)
Pre visit review using our clinic review tool, if applicable. No additional management support is needed unless otherwise documented below in the visit note. 

## 2016-07-10 NOTE — Assessment & Plan Note (Signed)
Mild to mod, for topical steroid prn,  to f/u any worsening symptoms or concerns

## 2016-07-10 NOTE — Progress Notes (Signed)
Subjective:    Patient ID: [name] Bell, female    DOB: [birth year], 75 y.o.   MRN: [id number]  HPI  Here with c/o 1 wk onset new rash has not had before, itchy to all distal fingers left hand, slight dull discomfort to palpate, no redness/swelling/drainage, fever, chills or hx of trauma.  Has not had this before. Has not tried any otc.  Nothing seems to make better or worse S/p right foot surgury to great toe joint, with inflammation expected for up to 1 yr. Pain overall improved.   Also with recent finding breast ca right breast,  Seeing Dr Lucia Gaskins and Dr Deon Pilling, with surgury planned for early October, for mastectomy with reconstruction and prob axillary LN evaluatoin as well.     Pt denies chest pain, increased sob or doe, wheezing, orthopnea, PND, increased LE swelling, palpitations, dizziness or syncope. Pt denies new neurological symptoms such as new headache, or facial or extremity weakness or numbness   Pt denies polydipsia, polyuria, Denies worsening depressive symptoms, suicidal ideation, or panic; has ongoing anxiety, Past Medical History  Diagnosis Date  . 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 ALLERGIC RHINITIS 01/24/2008  . ANEMIA-IRON DEFICIENCY 01/24/2008    pt denied  . ANXIETY 02/26/2009  . BACK PAIN 09/30/2010  . DIZZINESS 02/28/2010  . ELEVATED BLOOD PRESSURE WITHOUT DIAGNOSIS OF HYPERTENSION 02/26/2009  . FATIGUE 02/26/2009  . GERD 01/24/2008  . HYPERLIPIDEMIA 08/16/2008  . SHINGLES 11/06/2010  . TOE PAIN 09/30/2010  . VARICOSE VEINS, LOWER EXTREMITIES 08/16/2008  . 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 Cancer of central portion of female breast ([facility]) 05/04/2015  . Breast cancer ([place])   . History of blood transfusion ectopic pregnancy   Past Surgical History  Procedure Laterality Date  . Tonsillectomy    . Ectopic pregnancy surgery      at 75 yo with salpingectomy  . S/p bilateral breasts implants  12/2007  . S/p lumbar surgury  july 2010  . Foot surgery Right       bunion, hammer toe.  x 2  . Breast lumpectomy Right 05/14/2015    Procedure: RIGHT BREAST LUMPECTOMY;  Surgeon: Alphonsa Overall, MD;  Location: [facility];  Bell: General;  Laterality: Right;  . Foot surgery Right 05/2016    reports that she has never smoked. She has never used smokeless tobacco. She reports that she drinks about 3.6 oz of alcohol per week. She reports that she does not use illicit drugs. family history includes Cancer in her father. No Known Allergies Current Outpatient Prescriptions on File Prior to Visit  Medication Sig Dispense Refill  . clonazePAM (KLONOPIN) 1 MG tablet Take 1 tablet (1 mg total) by mouth 2 (two) times daily as needed for anxiety. 60 tablet 5  . Coenzyme Q10 (CO Q 10 PO) Take 1 capsule by mouth daily.    . fluticasone (FLONASE) 50 MCG/ACT nasal spray USE 2 SPRAYS INTO BOTH NOSTRILS DAILY 16 g 1  . Glucosamine HCl (GLUCOSAMINE PO) Take 1 tablet by mouth daily.    . hydrochlorothiazide (HYDRODIURIL) 25 MG tablet Take 25 mg by mouth daily as needed (for fluid).    . Multiple Vitamins-Minerals (HAIR/SKIN/NAILS PO) Take 1 tablet by mouth daily.     No current facility-administered medications on file prior to visit.   Review of Systems  Constitutional: Negative for unusual diaphoresis or night sweats HENT: Negative for ear swelling or discharge Eyes: Negative for worsening visual haziness  Respiratory: Negative for choking and  stridor.   Gastrointestinal: Negative for distension or worsening eructation Genitourinary: Negative for retention or change in urine volume.  Musculoskeletal: Negative for other MSK pain or swelling Skin: Negative for color change and worsening wound Neurological: Negative for tremors and numbness other than noted  Psychiatric/Behavioral: Negative for decreased concentration or agitation other than above       Objective:   Physical Exam BP 130/70 mmHg  Pulse 77  Temp(Src) 98.7 F (37.1 C) (Oral)  Resp 20  Wt 169 lb (76.658  kg)  SpO2 95% VS noted, not ill appearing Constitutional: Pt appears in no apparent distress HENT: Head: NCAT.  Right Ear: External ear normal.  Left Ear: External ear normal.  Eyes: . Pupils are equal, round, and reactive to light. Conjunctivae and EOM are normal Neck: Normal range of motion. Neck supple.  Cardiovascular: Normal rate and regular rhythm.   Pulmonary/Chest: Effort normal and breath sounds without rales or wheezing.  Right foot with mild red/swelling to great and 2nd toe and first MTP Neurological: Pt is alert. Not confused , motor grossly intact Skin: Skin is warm. no LE edema, marked eczematous type dermatitis to all distal fingers left hand, without significant tender/red/swelling or red streaks, no ulcers or drainage Psychiatric: Pt behavior is normal. No agitation. 1+ nervous    Assessment & Plan:

## 2016-07-10 NOTE — Assessment & Plan Note (Signed)
stable overall by history and exam, and pt to continue medical treatment as before,  to f/u any worsening symptoms or concerns 

## 2016-07-10 NOTE — Patient Instructions (Signed)
Please take all new medication as prescribed - the cream  Please continue all other medications as before, and refills have been done if requested.  Please have the pharmacy call with any other refills you may need.  Please keep your appointments with your specialists as you may have planned   

## 2016-07-11 DIAGNOSIS — Z9889 Other specified postprocedural states: Secondary | ICD-10-CM | POA: Diagnosis not present

## 2016-07-11 DIAGNOSIS — M19172 Post-traumatic osteoarthritis, left ankle and foot: Secondary | ICD-10-CM | POA: Diagnosis not present

## 2016-07-11 DIAGNOSIS — M79671 Pain in right foot: Secondary | ICD-10-CM | POA: Diagnosis not present

## 2016-07-11 DIAGNOSIS — Z4789 Encounter for other orthopedic aftercare: Secondary | ICD-10-CM | POA: Diagnosis not present

## 2016-07-11 DIAGNOSIS — R269 Unspecified abnormalities of gait and mobility: Secondary | ICD-10-CM | POA: Diagnosis not present

## 2016-07-14 DIAGNOSIS — L814 Other melanin hyperpigmentation: Secondary | ICD-10-CM | POA: Diagnosis not present

## 2016-07-14 DIAGNOSIS — L308 Other specified dermatitis: Secondary | ICD-10-CM | POA: Diagnosis not present

## 2016-07-18 DIAGNOSIS — R269 Unspecified abnormalities of gait and mobility: Secondary | ICD-10-CM | POA: Diagnosis not present

## 2016-07-18 DIAGNOSIS — Z4789 Encounter for other orthopedic aftercare: Secondary | ICD-10-CM | POA: Diagnosis not present

## 2016-07-18 DIAGNOSIS — M79671 Pain in right foot: Secondary | ICD-10-CM | POA: Diagnosis not present

## 2016-07-18 DIAGNOSIS — Z9889 Other specified postprocedural states: Secondary | ICD-10-CM | POA: Diagnosis not present

## 2016-07-18 DIAGNOSIS — M19172 Post-traumatic osteoarthritis, left ankle and foot: Secondary | ICD-10-CM | POA: Diagnosis not present

## 2016-07-25 DIAGNOSIS — M19172 Post-traumatic osteoarthritis, left ankle and foot: Secondary | ICD-10-CM | POA: Diagnosis not present

## 2016-07-25 DIAGNOSIS — Z4789 Encounter for other orthopedic aftercare: Secondary | ICD-10-CM | POA: Diagnosis not present

## 2016-07-25 DIAGNOSIS — M79671 Pain in right foot: Secondary | ICD-10-CM | POA: Diagnosis not present

## 2016-07-25 DIAGNOSIS — R269 Unspecified abnormalities of gait and mobility: Secondary | ICD-10-CM | POA: Diagnosis not present

## 2016-07-25 DIAGNOSIS — Z9889 Other specified postprocedural states: Secondary | ICD-10-CM | POA: Diagnosis not present

## 2016-08-01 DIAGNOSIS — M79671 Pain in right foot: Secondary | ICD-10-CM | POA: Diagnosis not present

## 2016-08-01 DIAGNOSIS — R269 Unspecified abnormalities of gait and mobility: Secondary | ICD-10-CM | POA: Diagnosis not present

## 2016-08-01 DIAGNOSIS — Z4789 Encounter for other orthopedic aftercare: Secondary | ICD-10-CM | POA: Diagnosis not present

## 2016-08-01 DIAGNOSIS — Z9889 Other specified postprocedural states: Secondary | ICD-10-CM | POA: Diagnosis not present

## 2016-08-01 DIAGNOSIS — M19172 Post-traumatic osteoarthritis, left ankle and foot: Secondary | ICD-10-CM | POA: Diagnosis not present

## 2016-08-04 ENCOUNTER — Other Ambulatory Visit: Payer: Self-pay | Admitting: Surgery

## 2016-08-04 DIAGNOSIS — M19172 Post-traumatic osteoarthritis, left ankle and foot: Secondary | ICD-10-CM | POA: Diagnosis not present

## 2016-08-04 DIAGNOSIS — R269 Unspecified abnormalities of gait and mobility: Secondary | ICD-10-CM | POA: Diagnosis not present

## 2016-08-04 DIAGNOSIS — M79671 Pain in right foot: Secondary | ICD-10-CM | POA: Diagnosis not present

## 2016-08-04 DIAGNOSIS — Z4789 Encounter for other orthopedic aftercare: Secondary | ICD-10-CM | POA: Diagnosis not present

## 2016-08-04 DIAGNOSIS — D0591 Unspecified type of carcinoma in situ of right breast: Secondary | ICD-10-CM

## 2016-08-04 DIAGNOSIS — Z9889 Other specified postprocedural states: Secondary | ICD-10-CM | POA: Diagnosis not present

## 2016-08-06 DIAGNOSIS — Z4789 Encounter for other orthopedic aftercare: Secondary | ICD-10-CM | POA: Diagnosis not present

## 2016-08-07 DIAGNOSIS — R269 Unspecified abnormalities of gait and mobility: Secondary | ICD-10-CM | POA: Diagnosis not present

## 2016-08-07 DIAGNOSIS — M79671 Pain in right foot: Secondary | ICD-10-CM | POA: Diagnosis not present

## 2016-08-07 DIAGNOSIS — Z4789 Encounter for other orthopedic aftercare: Secondary | ICD-10-CM | POA: Diagnosis not present

## 2016-08-07 DIAGNOSIS — Z9889 Other specified postprocedural states: Secondary | ICD-10-CM | POA: Diagnosis not present

## 2016-08-07 DIAGNOSIS — M19172 Post-traumatic osteoarthritis, left ankle and foot: Secondary | ICD-10-CM | POA: Diagnosis not present

## 2016-08-13 ENCOUNTER — Other Ambulatory Visit: Payer: Self-pay | Admitting: *Deleted

## 2016-08-13 MED ORDER — TRETINOIN 0.05 % EX CREA: TOPICAL_CREAM | Freq: Every day | CUTANEOUS | 0 refills | Status: DC

## 2016-08-13 NOTE — Telephone Encounter (Signed)
Rec'd fax stating pt rx has expired for Tretinoin 0.05% cream pt is wanting to get refill...[name]

## 2016-08-15 DIAGNOSIS — R269 Unspecified abnormalities of gait and mobility: Secondary | ICD-10-CM | POA: Diagnosis not present

## 2016-08-15 DIAGNOSIS — Z9889 Other specified postprocedural states: Secondary | ICD-10-CM | POA: Diagnosis not present

## 2016-08-15 DIAGNOSIS — M79671 Pain in right foot: Secondary | ICD-10-CM | POA: Diagnosis not present

## 2016-08-15 DIAGNOSIS — Z4789 Encounter for other orthopedic aftercare: Secondary | ICD-10-CM | POA: Diagnosis not present

## 2016-08-15 DIAGNOSIS — M19172 Post-traumatic osteoarthritis, left ankle and foot: Secondary | ICD-10-CM | POA: Diagnosis not present

## 2016-08-18 DIAGNOSIS — D0591 Unspecified type of carcinoma in situ of right breast: Secondary | ICD-10-CM | POA: Diagnosis not present

## 2016-09-18 ENCOUNTER — Ambulatory Visit (INDEPENDENT_AMBULATORY_CARE_PROVIDER_SITE_OTHER): Payer: Medicare Other | Admitting: Neurology

## 2016-09-18 DIAGNOSIS — Z4789 Encounter for other orthopedic aftercare: Secondary | ICD-10-CM | POA: Diagnosis not present

## 2016-09-18 DIAGNOSIS — G243 Spasmodic torticollis: Secondary | ICD-10-CM

## 2016-09-18 DIAGNOSIS — M79671 Pain in right foot: Secondary | ICD-10-CM | POA: Diagnosis not present

## 2016-09-18 MED ORDER — ONABOTULINUMTOXINA 100 UNITS IJ SOLR
300.0000 [IU] | Freq: Once | INTRAMUSCULAR | Status: AC
  Administered 2016-09-18: 300 [IU] via INTRAMUSCULAR

## 2016-09-18 NOTE — Addendum Note (Signed)
Addended byAnnamaria Helling on: 09/18/2016 09:44 AM   Modules accepted: Orders

## 2016-09-18 NOTE — Procedures (Signed)
[facility]   Procedure Note Botox  Attending: Dr. Wells Guiles Bettina Warn  Preoperative Diagnosis(es): Cervical Dystonia  Result History  Onset of effect: 2 days Duration of Benefit: still working but has head tilt; thinks it wore off more 2 weeks ago Adverse Effects: no dysphagia last 2 times  hPhysical Exam:  Head more turned to the right and significant tilt to the L   Consent obtained from: The patient  Benefits discussed included, but were not limited to decreased muscle tightness, increased joint range of motion, and decreased pain.  Risk discussed included, but were not limited pain and discomfort, bleeding, bruising, excessive weakness, venous thrombosis, muscle atrophy and dysphagia.  A copy of the patient medication guide was given to the patient which explains the blackbox warning.  Informed consent was obtained  Patients identity and treatment sites confirmed yes.     Details of Procedure: Skin was cleaned with alcohol.  A 30 gauge, 1/2 inch needle was introduced to the target muscle (except splenius capitus, posterior approach, where 27 inch, 1 1/2 gauge needle was used).  Prior to injection, the needle plunger was aspirated to make sure the needle was not within a blood vessel.  There was no blood retrieved on aspiration.    Following is a summary of the muscles injected  And the amount of Botulinum toxin used:   Dilution 0.9% preservative free saline mixed with 100 u Botox type A to make 10 U per 0.1cc  Injections  Location Left  Right Units Number of sites        Sternocleidomastoid 60+40 0 100 1  Splenius Capitus, posterior approach 0 100 100 1  Splenius Capitus, lateral approach 0 60 60 1  Levator Scapulae 30  40 0  Trapezius            TOTAL UNITS:   300    Agent: Botulinum Type A ( Onobotulinum Toxin type A ). 3 vials of Botox were used, containing 100 units and freshly diluted with 1 mL of sterile, non-preserved saline    Total injected (Units): 300  Total  wasted (Units): 0 units   Pt tolerated procedure well without complications.   Reinjection is anticipated in 3 months.

## 2016-09-19 ENCOUNTER — Ambulatory Visit: Payer: Medicare Other | Admitting: Neurology

## 2016-09-19 DIAGNOSIS — L308 Other specified dermatitis: Secondary | ICD-10-CM | POA: Diagnosis not present

## 2016-09-19 DIAGNOSIS — L821 Other seborrheic keratosis: Secondary | ICD-10-CM | POA: Diagnosis not present

## 2016-09-19 DIAGNOSIS — L820 Inflamed seborrheic keratosis: Secondary | ICD-10-CM | POA: Diagnosis not present

## 2016-09-22 IMAGING — MR MR BREAST BILATERAL W WO CONTRAST
8 of 13 series · 31 of 48 positions shown · IV contrast (15ml Multihance)
Comparison: Mammograms dated 05/01/2015, 04/30/2015, 04/19/2015.

CLINICAL DATA: Recently diagnosed right breast DCIS. Preoperative
evaluation.

LABS:  BUN and creatinine were obtained on site at [HOSPITAL]
[HOSPITAL].
Results:  BUN 13 mg/dL,  Creatinine 0.7 mg/dL.
EXAM:
BILATERAL BREAST MRI WITH AND WITHOUT CONTRAST
TECHNIQUE: Multiplanar, multisequence MR images of both breasts were obtained
prior to and following the intravenous administration of 15 ml of
MultiHance

[Series 2: T2 · axial · 3.0mm · 0.99mm/px · z∈[-107,+61]mm · 3 of 57 slices shown]
[im 1/57]
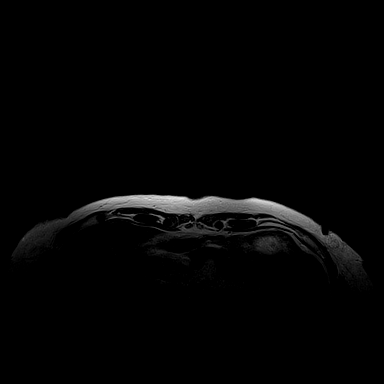
[im 29/57]
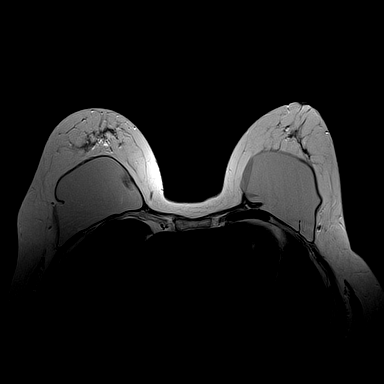
[im 57/57]
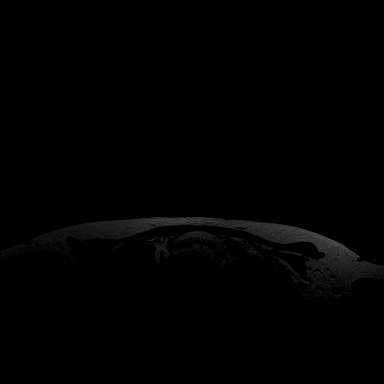

[Series 3: t2_tirm_tra ipat (a-p) · axial · 3.0mm · 0.74mm/px · z∈[-107,+61]mm · 2 of 57 slices shown]
[im 1/57]
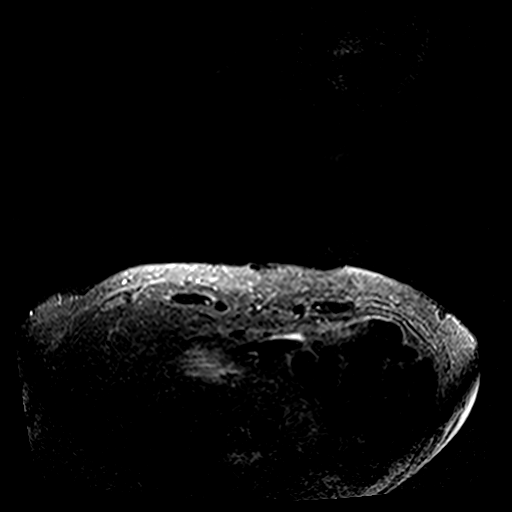
[im 57/57]
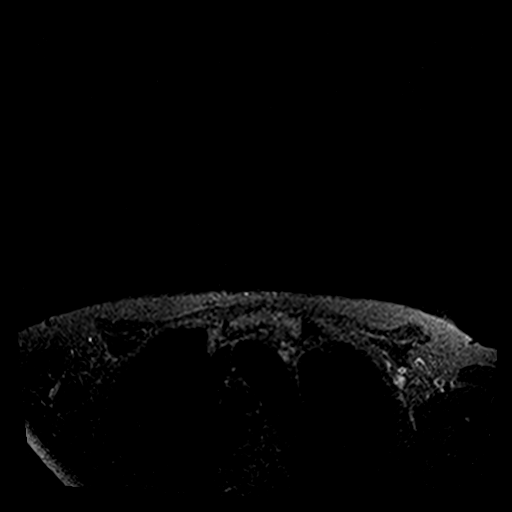

[Series 4: fl3d pre-cm no · axial · non-contrast · 1.2mm · 0.99mm/px · z∈[-108,+63]mm · 5 of 144 slices shown]
[im 1/144]
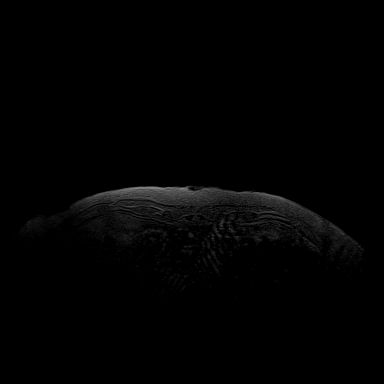
[im 36/144]
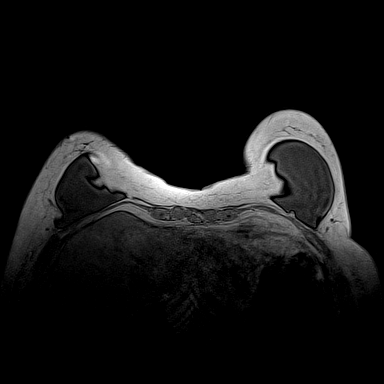
[im 72/144]
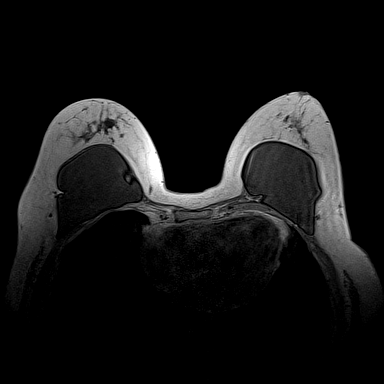
[im 108/144]
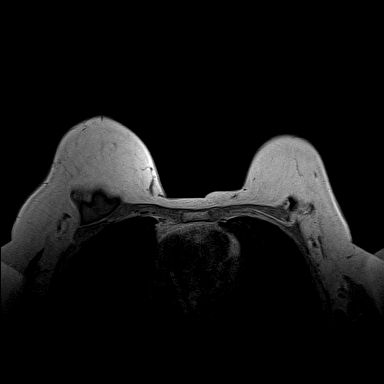
[im 144/144]
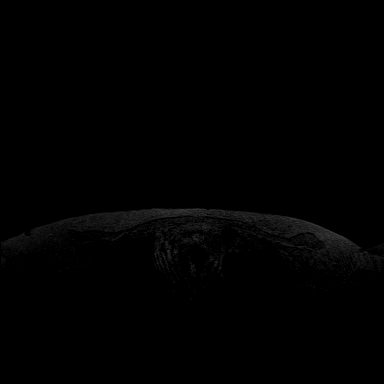

[Series 5: fl3d pre-cm · axial · non-contrast · 1.2mm · 0.99mm/px · z∈[-108,+63]mm · 5 of 144 slices shown]
[im 1/144]
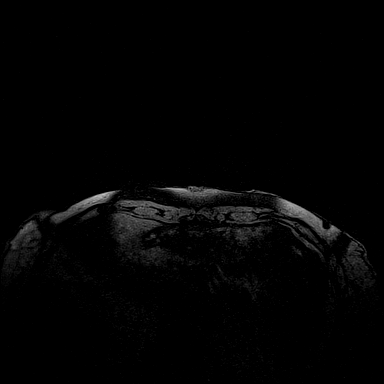
[im 36/144]
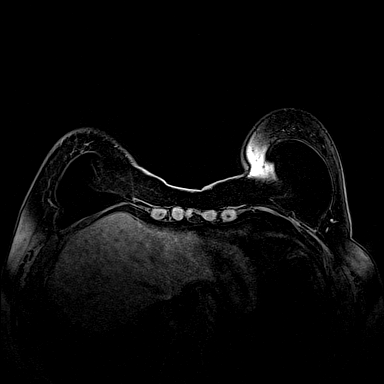
[im 72/144]
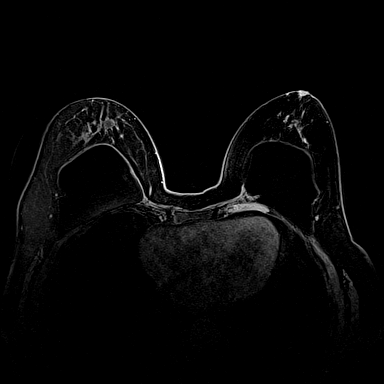
[im 108/144]
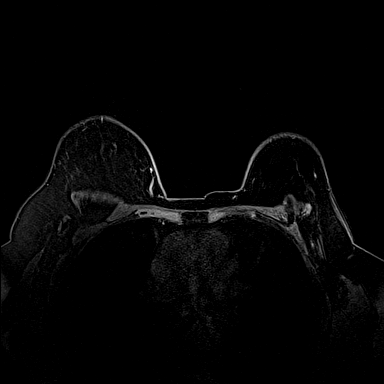
[im 144/144]
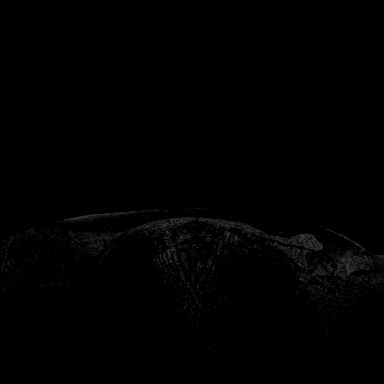

[Series 6: fl3d post-cm 20 · axial · 1.2mm · 0.99mm/px · z∈[-108,+63]mm · 5 of 144 slices shown (1 of 3)]
[im 1/144]
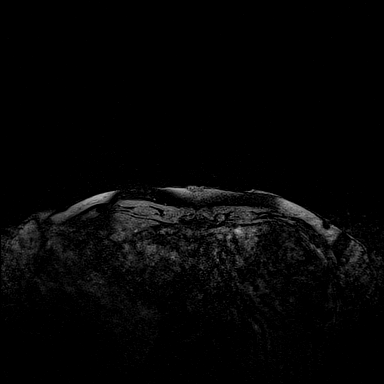
[im 36/144]
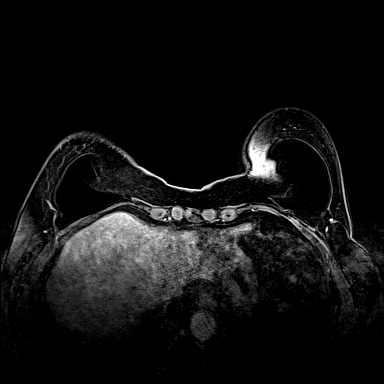
[im 72/144]
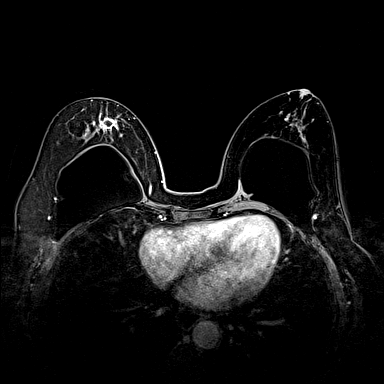
[im 108/144]
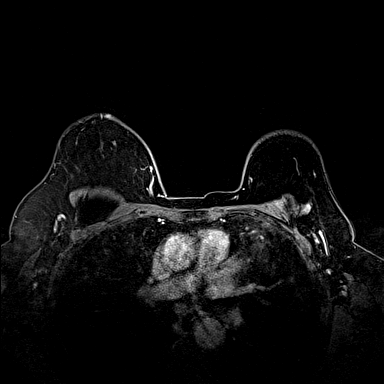
[im 144/144]
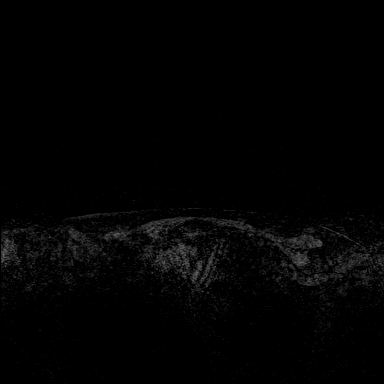

[Series 7: fl3d post-cm 20 · axial · 1.2mm · 0.99mm/px · z∈[-108,+63]mm · 5 of 144 slices shown (2 of 3)]
[im 1/144]
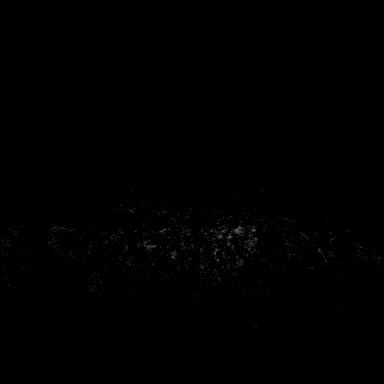
[im 36/144]
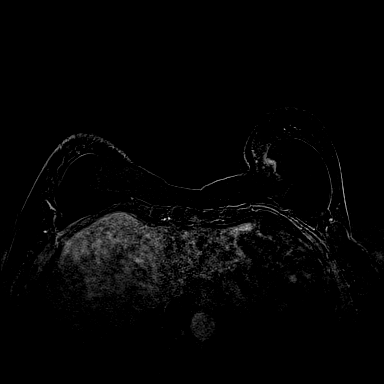
[im 72/144]
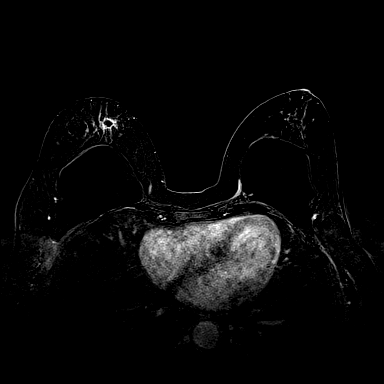
[im 108/144]
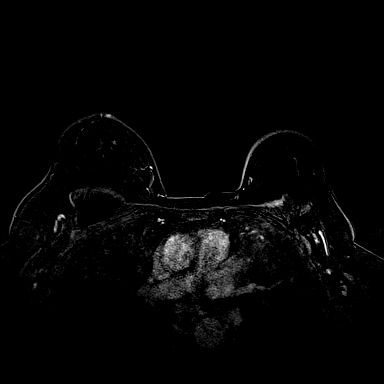
[im 144/144]
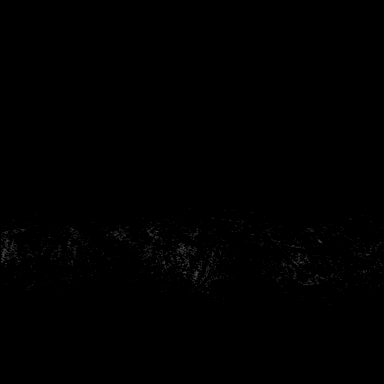

[Series 8: fl3d post-cm 20 · axial · 172.8mm · 0.99mm/px · 1 of 1 slices shown (3 of 3)]
[im 1/1]
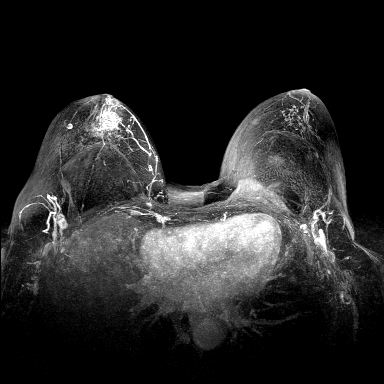

[Series 9: fl3d post-cm 3min · axial · 1.2mm · 0.99mm/px · z∈[-108,+63]mm · 5 of 144 slices shown]
[im 1/144]
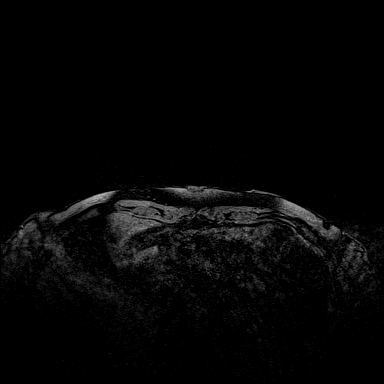
[im 36/144]
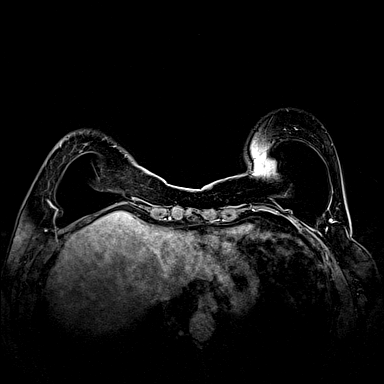
[im 72/144]
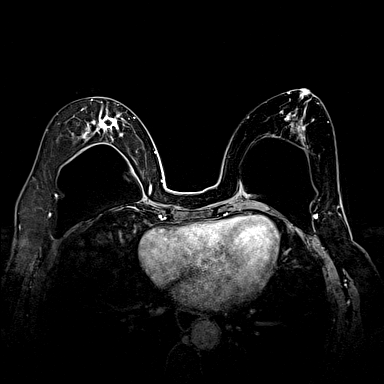
[im 108/144]
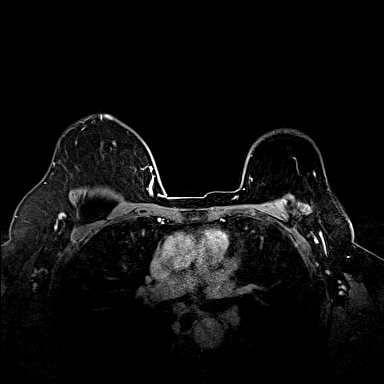
[im 144/144]
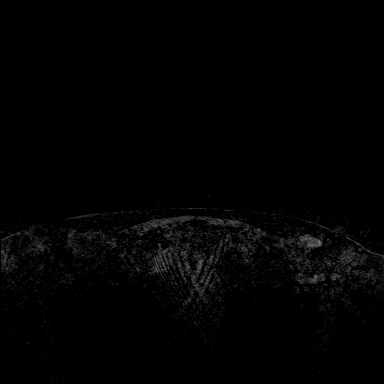

[31 of 48 positions shown; findings below may reference images not displayed]

THREE-DIMENSIONAL MR IMAGE RENDERING ON INDEPENDENT WORKSTATION:

Three-dimensional MR images were rendered by post-processing of the
original MR data on an independent workstation. The
three-dimensional MR images were interpreted, and findings are
reported in the following complete MRI report for this study. Three
dimensional images were evaluated at the independent [employer]
workstation
FINDINGS: Breast composition: b.  Scattered fibroglandular tissue.

Background parenchymal enhancement: Minimal

Right breast: Post biopsy changes and adjacent enhancement are seen
located within the subareolar portion of the right breast in the
area the patient's recent stereotactic biopsy. The enhancement
extends into the right nipple. The post biopsy hematoma with
surrounding enhancement measures 2.0 x 2.2 x 3.0 cm in size. The
area of suspicious calcification on the recent mammogram measured
2.2 cm in size. There is a small benign appearing intramammary lymph
node present within the lateral right breast. There are no
additional worrisome enhancing foci within the right breast.

Left breast: No mass or abnormal enhancement.

Lymph nodes: No abnormal appearing lymph nodes.

Ancillary findings:  None.
IMPRESSION: Enhancement went post biopsy change located within the subareolar
portion of the right breast in the area of the known DCIS via
stereotactic biopsy. This enhancement extends to involve the nipple.
The area of post biopsy change and enhancement measures 3.0 x 2.2 x
2.0 cm size. The area worrisome calcification by mammography
measured 2.2 cm in size. There are no additional findings.

RECOMMENDATION:
Treatment plan.

BI-RADS CATEGORY  6: Known biopsy-proven malignancy.

## 2016-09-26 ENCOUNTER — Ambulatory Visit (HOSPITAL_COMMUNITY): Payer: Self-pay | Admitting: Plastic Surgery

## 2016-09-26 DIAGNOSIS — C50911 Malignant neoplasm of unspecified site of right female breast: Secondary | ICD-10-CM | POA: Diagnosis not present

## 2016-09-30 ENCOUNTER — Encounter (HOSPITAL_COMMUNITY): Payer: Self-pay

## 2016-09-30 NOTE — Pre-Procedure Instructions (Signed)
[name] Bell  09/30/2016      [id number] - [place], [place] [name] [address] Phone: [phone] Fax: [place] - [address] [place] [address] Phone: [phone] Fax: [phone]  [id number] - [place] [id number] Phone: [phone] Fax: [phone]    Your procedure is scheduled on Thursday October 12.  Report to [facility] Admitting at 5:30 A.M.  Call this number if you have problems the morning of surgery:  [phone]   Remember:  Do not eat food or drink liquids after midnight.  Take these medicines the morning of surgery with A SIP OF WATER: clonazepam (Klonopin) if needed, flonase if needed  7 days prior to surgery STOP taking any Aspirin, Aleve, Naproxen, Ibuprofen, Motrin, Advil, Goody's, BC's, all herbal medications, fish oil, and all vitamins    Do not wear jewelry, make-up or nail polish.  Do not wear lotions, powders, or perfumes, or deoderant.  Do not shave 48 hours prior to surgery.  Men may shave face and neck.  Do not bring valuables to the hospital.  [facility] is not responsible for any belongings or valuables.  Contacts, dentures or bridgework may not be worn into surgery.  Leave your suitcase in the car.  After surgery it may be brought to your room.  For patients admitted to the hospital, discharge time will be determined by your treatment team.  Patients discharged the day of surgery will not be allowed to drive home.    Special instructions:    [facility]- Preparing For Surgery  Before surgery, you can play an important role. Because skin is not sterile, your skin needs to be as free of germs as possible. You can reduce the number of germs on your skin by washing with CHG (chlorahexidine gluconate) Soap before surgery.  CHG is an antiseptic cleaner  which kills germs and bonds with the skin to continue killing germs even after washing.  Please do not use if you have an allergy to CHG or antibacterial soaps. If your skin becomes reddened/irritated stop using the CHG.  Do not shave (including legs and underarms) for at least 48 hours prior to first CHG shower. It is OK to shave your face.  Please follow these instructions carefully.   1. Shower the NIGHT BEFORE SURGERY and the MORNING OF SURGERY with CHG.   2. If you chose to wash your hair, wash your hair first as usual with your normal shampoo.  3. After you shampoo, rinse your hair and body thoroughly to remove the shampoo.  4. Use CHG as you would any other liquid soap. You can apply CHG directly to the skin and wash gently with a scrungie or a clean washcloth.   5. Apply the CHG Soap to your body ONLY FROM THE NECK DOWN.  Do not use on open wounds or open sores. Avoid contact with your eyes, ears, mouth and genitals (private parts). Wash genitals (private parts) with your normal soap.  6. Wash thoroughly, paying special attention to the area where your surgery will be performed.  7. Thoroughly rinse your body with warm water from the neck down.  8. DO NOT shower/wash with your normal soap after using and rinsing off the CHG Soap.  9. Pat yourself dry with a CLEAN  TOWEL.   10. Wear CLEAN PAJAMAS   11. Place CLEAN SHEETS on your bed the night of your first shower and DO NOT SLEEP WITH PETS.    Day of Surgery: Do not apply any deodorants/lotions. Please wear clean clothes to the hospital/surgery center.

## 2016-10-01 ENCOUNTER — Encounter (HOSPITAL_COMMUNITY): Payer: Self-pay

## 2016-10-01 ENCOUNTER — Encounter (HOSPITAL_COMMUNITY)
Admission: RE | Admit: 2016-10-01 | Discharge: 2016-10-01 | Disposition: A | Discharge status: Home / Self Care | Payer: Medicare Other | Source: Ambulatory Visit | Attending: Surgery | Admitting: Surgery

## 2016-10-01 DIAGNOSIS — R030 Elevated blood-pressure reading, without diagnosis of hypertension: Secondary | ICD-10-CM | POA: Diagnosis not present

## 2016-10-01 DIAGNOSIS — Z01810 Encounter for preprocedural cardiovascular examination: Secondary | ICD-10-CM | POA: Insufficient documentation

## 2016-10-01 DIAGNOSIS — Z01812 Encounter for preprocedural laboratory examination: Secondary | ICD-10-CM | POA: Insufficient documentation

## 2016-10-01 LAB — CBC
HCT: 40.6 % (ref 36.0–46.0)
Hemoglobin: 13.2 g/dL (ref 12.0–15.0)
MCH: 30.8 pg (ref 26.0–34.0)
MCHC: 32.5 g/dL (ref 30.0–36.0)
MCV: 94.9 fL (ref 78.0–100.0)
PLATELETS: 312 10*3/uL (ref 150–400)
RBC: 4.28 MIL/uL (ref 3.87–5.11)
RDW: 13.6 % (ref 11.5–15.5)
WBC: 7 10*3/uL (ref 4.0–10.5)

## 2016-10-01 LAB — BASIC METABOLIC PANEL
Anion gap: 5 (ref 5–15)
BUN: 9 mg/dL (ref 6–20)
CHLORIDE: 107 mmol/L (ref 101–111)
CO2: 27 mmol/L (ref 22–32)
CREATININE: 0.76 mg/dL (ref 0.44–1.00)
Calcium: 8.9 mg/dL (ref 8.9–10.3)
GFR calc Af Amer: >60 mL/min (ref >60)
GFR calc non Af Amer: >60 mL/min (ref >60)
Glucose, Bld: 94 mg/dL (ref 65–99)
Potassium: 4.3 mmol/L (ref 3.5–5.1)
SODIUM: 139 mmol/L (ref 135–145)

## 2016-10-01 MED ORDER — TRETINOIN 0.025 % EX CREA: TOPICAL_CREAM | Freq: Every day | CUTANEOUS | 0 refills | Status: DC

## 2016-10-01 NOTE — Progress Notes (Signed)
PCP: Cathlean Cower No cardiologist or cardiac workup per pt.   EKG: 10/01/16 No complaints of chest pain, shortness of breath at this time.

## 2016-10-02 ENCOUNTER — Ambulatory Visit: Payer: Medicare Other | Admitting: Neurology

## 2016-10-02 NOTE — Progress Notes (Signed)
Anesthesia Chart Review: Patient is a 75 year old female for right mastectomy with SN biopsy (Dr. Keturah Barre. Newman) and right breast reconstruction (Dr. [name]) on 10/09/16.  History includes breast cancer, right breast lumpectomy 05/14/15, non-smoker, anemia, elevated BP without diagnosis of HTN, HLD, GERD, varicose veins. PCP is Dr. Cathlean Cower. HEM-ONC is Dr. Lindi Adie. Neurologist is Dr. Carles Collet (receives Botox for cervical dystonia).  Meds include HCTZ (PRN swelling; related to previous foot surgery).   BP 139/74   Pulse 66   Temp 36.8 C   Resp 20   Ht 5\' 4"  (1.626 m)   Wt 165 lb 3.2 oz (74.9 kg)   SpO2 98%   BMI 28.36 kg/m   Decision for pre-operative EKG based on history of elevated BP in the past, HLD, with plans for surgery involving two surgeons. EKG showed NSR.  Last labs and CXR noted.   If no acute changes then I anticipate that she can proceed as planned.   [facility] Short Stay Center/Anesthesiology Phone [phone] 10/02/2016 1:45 PM

## 2016-10-08 MED ORDER — GABAPENTIN 300 MG PO CAPS
300.0000 mg | ORAL_CAPSULE | ORAL | Status: AC
  Administered 2016-10-09: 300 mg via ORAL
  Filled 2016-10-08: qty 1

## 2016-10-08 MED ORDER — HEPARIN SODIUM (PORCINE) 5000 UNIT/ML IJ SOLN
5000.0000 [IU] | Freq: Once | INTRAMUSCULAR | Status: AC
  Administered 2016-10-09: 5000 [IU] via SUBCUTANEOUS
  Filled 2016-10-08: qty 1

## 2016-10-08 MED ORDER — ACETAMINOPHEN 500 MG PO TABS
1000.0000 mg | ORAL_TABLET | ORAL | Status: AC
  Administered 2016-10-09: 1000 mg via ORAL
  Filled 2016-10-08: qty 2

## 2016-10-08 MED ORDER — CEFAZOLIN SODIUM-DEXTROSE 2-4 GM/100ML-% IV SOLN
2.0000 g | INTRAVENOUS | Status: AC
  Administered 2016-10-09: 2 g via INTRAVENOUS
  Filled 2016-10-08: qty 100

## 2016-10-09 ENCOUNTER — Encounter (HOSPITAL_COMMUNITY)
Admission: RE | Disposition: A | Discharge status: Home / Self Care | Payer: Self-pay | Source: Ambulatory Visit | Attending: Surgery

## 2016-10-09 ENCOUNTER — Ambulatory Visit (HOSPITAL_COMMUNITY): Payer: Medicare Other | Admitting: Vascular Surgery

## 2016-10-09 ENCOUNTER — Encounter (HOSPITAL_COMMUNITY): Payer: Self-pay | Admitting: Surgery

## 2016-10-09 ENCOUNTER — Ambulatory Visit (HOSPITAL_COMMUNITY)
Admission: RE | Admit: 2016-10-09 | Discharge: 2016-10-11 | Disposition: A | Discharge status: Home / Self Care | Payer: Medicare Other | Source: Ambulatory Visit | Attending: Surgery | Admitting: Surgery

## 2016-10-09 ENCOUNTER — Ambulatory Visit (HOSPITAL_COMMUNITY)
Admission: RE | Admit: 2016-10-09 | Discharge: 2016-10-09 | Disposition: A | Discharge status: Home / Self Care | Payer: Medicare Other | Source: Ambulatory Visit | Attending: Surgery | Admitting: Surgery

## 2016-10-09 ENCOUNTER — Ambulatory Visit (HOSPITAL_COMMUNITY): Payer: Medicare Other | Admitting: Certified Registered Nurse Anesthetist

## 2016-10-09 ENCOUNTER — Ambulatory Visit (HOSPITAL_COMMUNITY): Payer: Medicare Other | Admitting: Anesthesiology

## 2016-10-09 DIAGNOSIS — K219 Gastro-esophageal reflux disease without esophagitis: Secondary | ICD-10-CM | POA: Diagnosis not present

## 2016-10-09 DIAGNOSIS — F419 Anxiety disorder, unspecified: Secondary | ICD-10-CM | POA: Diagnosis not present

## 2016-10-09 DIAGNOSIS — Z9882 Breast implant status: Secondary | ICD-10-CM | POA: Diagnosis not present

## 2016-10-09 DIAGNOSIS — C50911 Malignant neoplasm of unspecified site of right female breast: Secondary | ICD-10-CM | POA: Diagnosis not present

## 2016-10-09 DIAGNOSIS — N6011 Diffuse cystic mastopathy of right breast: Secondary | ICD-10-CM | POA: Diagnosis not present

## 2016-10-09 DIAGNOSIS — R921 Mammographic calcification found on diagnostic imaging of breast: Secondary | ICD-10-CM | POA: Diagnosis not present

## 2016-10-09 DIAGNOSIS — Z170 Estrogen receptor positive status [ER+]: Secondary | ICD-10-CM | POA: Insufficient documentation

## 2016-10-09 DIAGNOSIS — N6081 Other benign mammary dysplasias of right breast: Secondary | ICD-10-CM | POA: Diagnosis not present

## 2016-10-09 DIAGNOSIS — N6489 Other specified disorders of breast: Secondary | ICD-10-CM | POA: Diagnosis not present

## 2016-10-09 DIAGNOSIS — G249 Dystonia, unspecified: Secondary | ICD-10-CM | POA: Insufficient documentation

## 2016-10-09 DIAGNOSIS — D0591 Unspecified type of carcinoma in situ of right breast: Secondary | ICD-10-CM

## 2016-10-09 DIAGNOSIS — Z853 Personal history of malignant neoplasm of breast: Secondary | ICD-10-CM | POA: Diagnosis not present

## 2016-10-09 DIAGNOSIS — Y813 Surgical instruments, materials and general- and plastic-surgery devices (including sutures) associated with adverse incidents: Secondary | ICD-10-CM | POA: Diagnosis not present

## 2016-10-09 DIAGNOSIS — D0511 Intraductal carcinoma in situ of right breast: Secondary | ICD-10-CM | POA: Insufficient documentation

## 2016-10-09 DIAGNOSIS — L7632 Postprocedural hematoma of skin and subcutaneous tissue following other procedure: Secondary | ICD-10-CM | POA: Insufficient documentation

## 2016-10-09 DIAGNOSIS — T85898A Other specified complication of other internal prosthetic devices, implants and grafts, initial encounter: Secondary | ICD-10-CM | POA: Diagnosis not present

## 2016-10-09 DIAGNOSIS — D509 Iron deficiency anemia, unspecified: Secondary | ICD-10-CM | POA: Diagnosis not present

## 2016-10-09 DIAGNOSIS — Y836 Removal of other organ (partial) (total) as the cause of abnormal reaction of the patient, or of later complication, without mention of misadventure at the time of the procedure: Secondary | ICD-10-CM | POA: Insufficient documentation

## 2016-10-09 DIAGNOSIS — G8918 Other acute postprocedural pain: Secondary | ICD-10-CM | POA: Diagnosis not present

## 2016-10-09 HISTORY — PX: MASTECTOMY: SHX3

## 2016-10-09 HISTORY — PX: AUGMENTATION MAMMAPLASTY: SUR837

## 2016-10-09 HISTORY — PX: INCISION AND DRAINAGE OF WOUND: SHX1803

## 2016-10-09 HISTORY — PX: MASTECTOMY W/ SENTINEL NODE BIOPSY: SHX2001

## 2016-10-09 HISTORY — PX: BREAST RECONSTRUCTION WITH PLACEMENT OF TISSUE EXPANDER AND FLEX HD (ACELLULAR HYDRATED DERMIS): SHX6295

## 2016-10-09 SURGERY — IRRIGATION AND DEBRIDEMENT WOUND
Anesthesia: General | Site: Breast | Laterality: Right

## 2016-10-09 SURGERY — MASTECTOMY WITH SENTINEL LYMPH NODE BIOPSY
Anesthesia: General | Site: Breast | Laterality: Right

## 2016-10-09 MED ORDER — HYDROMORPHONE HCL 1 MG/ML IJ SOLN
INTRAMUSCULAR | Status: AC
  Filled 2016-10-09: qty 1

## 2016-10-09 MED ORDER — DIPHENHYDRAMINE HCL 50 MG/ML IJ SOLN
INTRAMUSCULAR | Status: DC | PRN
  Administered 2016-10-09: 25 mg via INTRAVENOUS

## 2016-10-09 MED ORDER — PHENYLEPHRINE HCL 10 MG/ML IJ SOLN
INTRAMUSCULAR | Status: DC | PRN
  Administered 2016-10-09: 40 ug via INTRAVENOUS

## 2016-10-09 MED ORDER — HYDROCHLOROTHIAZIDE 25 MG PO TABS: 25.0000 mg | ORAL_TABLET | Freq: Every day | ORAL | Status: DC | PRN

## 2016-10-09 MED ORDER — BACITRACIN ZINC 500 UNIT/GM EX OINT
TOPICAL_OINTMENT | CUTANEOUS | Status: AC
  Filled 2016-10-09: qty 28.35

## 2016-10-09 MED ORDER — HYDROMORPHONE HCL 1 MG/ML IJ SOLN
0.2500 mg | INTRAMUSCULAR | Status: DC | PRN
  Administered 2016-10-09: 0.25 mg via INTRAVENOUS
  Administered 2016-10-09 (×4): 0.5 mg via INTRAVENOUS
  Administered 2016-10-09: 0.25 mg via INTRAVENOUS

## 2016-10-09 MED ORDER — HYDROMORPHONE HCL 2 MG PO TABS
2.0000 mg | ORAL_TABLET | ORAL | Status: DC | PRN
  Administered 2016-10-09 – 2016-10-11 (×8): 4 mg via ORAL
  Filled 2016-10-09 (×8): qty 2

## 2016-10-09 MED ORDER — MIDAZOLAM HCL 5 MG/5ML IJ SOLN
INTRAMUSCULAR | Status: DC | PRN
  Administered 2016-10-09: 2 mg via INTRAVENOUS

## 2016-10-09 MED ORDER — ONDANSETRON HCL 4 MG/2ML IJ SOLN
INTRAMUSCULAR | Status: AC
  Filled 2016-10-09: qty 2

## 2016-10-09 MED ORDER — PROPOFOL 10 MG/ML IV BOLUS
INTRAVENOUS | Status: AC
  Filled 2016-10-09: qty 20

## 2016-10-09 MED ORDER — FENTANYL CITRATE (PF) 100 MCG/2ML IJ SOLN: 25.0000 ug | INTRAMUSCULAR | Status: DC | PRN

## 2016-10-09 MED ORDER — ACETAMINOPHEN 325 MG PO TABS: 650.0000 mg | ORAL_TABLET | Freq: Four times a day (QID) | ORAL | Status: DC | PRN

## 2016-10-09 MED ORDER — LACTATED RINGERS IV SOLN
INTRAVENOUS | Status: DC | PRN
  Administered 2016-10-09 (×2): via INTRAVENOUS

## 2016-10-09 MED ORDER — CLONAZEPAM 1 MG PO TABS: 1.0000 mg | ORAL_TABLET | Freq: Two times a day (BID) | ORAL | Status: DC | PRN

## 2016-10-09 MED ORDER — LACTATED RINGERS IV SOLN
INTRAVENOUS | Status: DC | PRN
  Administered 2016-10-09: 18:00:00 via INTRAVENOUS

## 2016-10-09 MED ORDER — ROCURONIUM BROMIDE 10 MG/ML (PF) SYRINGE
PREFILLED_SYRINGE | INTRAVENOUS | Status: AC
  Filled 2016-10-09: qty 10

## 2016-10-09 MED ORDER — PROPOFOL 10 MG/ML IV BOLUS
INTRAVENOUS | Status: DC | PRN
  Administered 2016-10-09: 120 mg via INTRAVENOUS

## 2016-10-09 MED ORDER — SODIUM CHLORIDE 0.9 % IV SOLN
Freq: Once | INTRAVENOUS | Status: AC
  Administered 2016-10-09: 1000 mL
  Filled 2016-10-09: qty 1

## 2016-10-09 MED ORDER — CEFAZOLIN IN D5W 1 GM/50ML IV SOLN
1.0000 g | Freq: Four times a day (QID) | INTRAVENOUS | Status: DC
  Administered 2016-10-09 – 2016-10-11 (×8): 1 g via INTRAVENOUS
  Filled 2016-10-09 (×10): qty 50

## 2016-10-09 MED ORDER — CHLORHEXIDINE GLUCONATE CLOTH 2 % EX PADS: 6.0000 | MEDICATED_PAD | Freq: Once | CUTANEOUS | Status: DC

## 2016-10-09 MED ORDER — EPHEDRINE SULFATE 50 MG/ML IJ SOLN
INTRAMUSCULAR | Status: DC | PRN
  Administered 2016-10-09: 5 mg via INTRAVENOUS

## 2016-10-09 MED ORDER — EPHEDRINE SULFATE 50 MG/ML IJ SOLN
INTRAMUSCULAR | Status: DC | PRN
  Administered 2016-10-09 (×3): 10 mg via INTRAVENOUS

## 2016-10-09 MED ORDER — SUGAMMADEX SODIUM 200 MG/2ML IV SOLN
INTRAVENOUS | Status: DC | PRN
  Administered 2016-10-09: 200 mg via INTRAVENOUS

## 2016-10-09 MED ORDER — METHOCARBAMOL 500 MG PO TABS
500.0000 mg | ORAL_TABLET | Freq: Four times a day (QID) | ORAL | Status: DC | PRN
  Administered 2016-10-10: 500 mg via ORAL
  Filled 2016-10-09: qty 1

## 2016-10-09 MED ORDER — WHITE PETROLATUM GEL
Status: AC
  Administered 2016-10-09: 1
  Filled 2016-10-09: qty 1

## 2016-10-09 MED ORDER — TECHNETIUM TC 99M SULFUR COLLOID FILTERED
1.0000 | Freq: Once | INTRAVENOUS | Status: AC | PRN
  Administered 2016-10-09: 1 via INTRADERMAL

## 2016-10-09 MED ORDER — CEFAZOLIN SODIUM 1 G IJ SOLR
INTRAMUSCULAR | Status: AC
  Filled 2016-10-09: qty 10

## 2016-10-09 MED ORDER — FENTANYL CITRATE (PF) 100 MCG/2ML IJ SOLN
INTRAMUSCULAR | Status: DC | PRN
  Administered 2016-10-09 (×2): 100 ug via INTRAVENOUS

## 2016-10-09 MED ORDER — LIDOCAINE HCL (CARDIAC) 20 MG/ML IV SOLN
INTRAVENOUS | Status: DC | PRN
  Administered 2016-10-09: 100 mg via INTRAVENOUS

## 2016-10-09 MED ORDER — DOCUSATE SODIUM 100 MG PO CAPS
100.0000 mg | ORAL_CAPSULE | Freq: Every day | ORAL | Status: DC
  Administered 2016-10-09 – 2016-10-11 (×3): 100 mg via ORAL
  Filled 2016-10-09 (×3): qty 1

## 2016-10-09 MED ORDER — ENOXAPARIN SODIUM 40 MG/0.4ML ~~LOC~~ SOLN: 40.0000 mg | SUBCUTANEOUS | Status: DC

## 2016-10-09 MED ORDER — PROMETHAZINE HCL 25 MG/ML IJ SOLN: 6.2500 mg | INTRAMUSCULAR | Status: DC | PRN

## 2016-10-09 MED ORDER — LIDOCAINE 2% (20 MG/ML) 5 ML SYRINGE
INTRAMUSCULAR | Status: AC
  Filled 2016-10-09: qty 5

## 2016-10-09 MED ORDER — SODIUM CHLORIDE 0.9 % IR SOLN
Status: DC | PRN
  Administered 2016-10-09: 500 mL

## 2016-10-09 MED ORDER — ONDANSETRON HCL 4 MG/2ML IJ SOLN
INTRAMUSCULAR | Status: DC | PRN
  Administered 2016-10-09: 4 mg via INTRAVENOUS

## 2016-10-09 MED ORDER — FENTANYL CITRATE (PF) 100 MCG/2ML IJ SOLN
INTRAMUSCULAR | Status: AC
  Filled 2016-10-09: qty 4

## 2016-10-09 MED ORDER — FENTANYL CITRATE (PF) 100 MCG/2ML IJ SOLN
INTRAMUSCULAR | Status: DC | PRN
  Administered 2016-10-09: 100 ug via INTRAVENOUS

## 2016-10-09 MED ORDER — FENTANYL CITRATE (PF) 100 MCG/2ML IJ SOLN
INTRAMUSCULAR | Status: AC
  Filled 2016-10-09: qty 2

## 2016-10-09 MED ORDER — DEXAMETHASONE SODIUM PHOSPHATE 10 MG/ML IJ SOLN
INTRAMUSCULAR | Status: DC | PRN
  Administered 2016-10-09: 10 mg via INTRAVENOUS

## 2016-10-09 MED ORDER — DEXTROSE-NACL 5-0.45 % IV SOLN
INTRAVENOUS | Status: DC
  Administered 2016-10-09 – 2016-10-10 (×2): via INTRAVENOUS

## 2016-10-09 MED ORDER — EPHEDRINE 5 MG/ML INJ
INTRAVENOUS | Status: AC
  Filled 2016-10-09: qty 10

## 2016-10-09 MED ORDER — BACITRACIN ZINC 500 UNIT/GM EX OINT
TOPICAL_OINTMENT | CUTANEOUS | Status: DC | PRN
  Administered 2016-10-09: 1 via TOPICAL

## 2016-10-09 MED ORDER — MIDAZOLAM HCL 2 MG/2ML IJ SOLN
INTRAMUSCULAR | Status: AC
  Filled 2016-10-09: qty 2

## 2016-10-09 MED ORDER — ROCURONIUM BROMIDE 100 MG/10ML IV SOLN
INTRAVENOUS | Status: DC | PRN
  Administered 2016-10-09: 50 mg via INTRAVENOUS
  Administered 2016-10-09: 20 mg via INTRAVENOUS
  Administered 2016-10-09: 10 mg via INTRAVENOUS

## 2016-10-09 MED ORDER — MEPERIDINE HCL 25 MG/ML IJ SOLN: 6.2500 mg | INTRAMUSCULAR | Status: DC | PRN

## 2016-10-09 MED ORDER — 0.9 % SODIUM CHLORIDE (POUR BTL) OPTIME
TOPICAL | Status: DC | PRN
  Administered 2016-10-09: 1000 mL

## 2016-10-09 MED ORDER — ONDANSETRON HCL 4 MG/2ML IJ SOLN: 4.0000 mg | Freq: Once | INTRAMUSCULAR | Status: DC | PRN

## 2016-10-09 MED ORDER — 0.9 % SODIUM CHLORIDE (POUR BTL) OPTIME
TOPICAL | Status: DC | PRN
  Administered 2016-10-09 (×3): 1000 mL

## 2016-10-09 MED ORDER — SUGAMMADEX SODIUM 200 MG/2ML IV SOLN
INTRAVENOUS | Status: AC
  Filled 2016-10-09: qty 2

## 2016-10-09 SURGICAL SUPPLY — 38 items
ATCH SMKEVC FLXB CAUT HNDSWH (FILTER) ×1 IMPLANT
BANDAGE ACE 6X5 VEL STRL LF (GAUZE/BANDAGES/DRESSINGS) IMPLANT
BANDAGE ELASTIC 6 VELCRO ST LF (GAUZE/BANDAGES/DRESSINGS) ×2 IMPLANT
BIOPATCH RED 1 DISK 7.0 (GAUZE/BANDAGES/DRESSINGS) IMPLANT
CANISTER SUCTION 2500CC (MISCELLANEOUS) ×2 IMPLANT
COVER SURGICAL LIGHT HANDLE (MISCELLANEOUS) ×2 IMPLANT
DRAIN CHANNEL 19F RND (DRAIN) ×2 IMPLANT
DRAPE CHEST BREAST 15X10 FENES (DRAPES) ×2 IMPLANT
DRSG PAD ABDOMINAL 8X10 ST (GAUZE/BANDAGES/DRESSINGS) ×4 IMPLANT
ELECT BLADE 4.0 EZ CLEAN MEGAD (MISCELLANEOUS) ×2
ELECT REM PT RETURN 9FT ADLT (ELECTROSURGICAL) ×2
ELECTRODE BLDE 4.0 EZ CLN MEGD (MISCELLANEOUS) ×1 IMPLANT
ELECTRODE REM PT RTRN 9FT ADLT (ELECTROSURGICAL) ×1 IMPLANT
EVACUATOR SILICONE 100CC (DRAIN) ×2 IMPLANT
EVACUATOR SMOKE ACCUVAC VALLEY (FILTER) ×1
GAUZE SPONGE 4X4 12PLY STRL (GAUZE/BANDAGES/DRESSINGS) ×2 IMPLANT
GLOVE BIO SURGEON STRL SZ7.5 (GLOVE) ×4 IMPLANT
GLOVE BIOGEL PI IND STRL 8 (GLOVE) ×2 IMPLANT
GLOVE BIOGEL PI INDICATOR 8 (GLOVE) ×2
GOWN STRL REUS W/ TWL LRG LVL3 (GOWN DISPOSABLE) IMPLANT
GOWN STRL REUS W/ TWL XL LVL3 (GOWN DISPOSABLE) ×2 IMPLANT
GOWN STRL REUS W/TWL LRG LVL3 (GOWN DISPOSABLE)
GOWN STRL REUS W/TWL XL LVL3 (GOWN DISPOSABLE) ×2
KIT BASIN OR (CUSTOM PROCEDURE TRAY) ×2 IMPLANT
KIT ROOM TURNOVER OR (KITS) ×2 IMPLANT
MARKER SKIN DUAL TIP RULER LAB (MISCELLANEOUS) ×2 IMPLANT
NS IRRIG 1000ML POUR BTL (IV SOLUTION) ×2 IMPLANT
PACK GENERAL/GYN (CUSTOM PROCEDURE TRAY) ×2 IMPLANT
PACK UNIVERSAL I (CUSTOM PROCEDURE TRAY) IMPLANT
PAD ARMBOARD 7.5X6 YLW CONV (MISCELLANEOUS) ×4 IMPLANT
PREFILTER EVAC NS 1 1/3-3/8IN (MISCELLANEOUS) ×2 IMPLANT
STAPLER VISISTAT 35W (STAPLE) IMPLANT
SUT MNCRL AB 3-0 PS2 18 (SUTURE) ×2 IMPLANT
SUT PROLENE 3 0 PS 2 (SUTURE) ×4 IMPLANT
SUT VIC AB 3-0 SH 18 (SUTURE) ×2 IMPLANT
SYR BULB IRRIGATION 50ML (SYRINGE) ×2 IMPLANT
TOWEL OR 17X24 6PK STRL BLUE (TOWEL DISPOSABLE) ×2 IMPLANT
TOWEL OR 17X26 10 PK STRL BLUE (TOWEL DISPOSABLE) ×2 IMPLANT

## 2016-10-09 SURGICAL SUPPLY — 79 items
APPLIER CLIP 9.375 MED OPEN (MISCELLANEOUS) ×2
ATCH SMKEVC FLXB CAUT HNDSWH (FILTER) ×1 IMPLANT
BAG DECANTER FOR FLEXI CONT (MISCELLANEOUS) ×2 IMPLANT
BINDER BREAST 3XL (BIND) ×2 IMPLANT
BINDER BREAST LRG (GAUZE/BANDAGES/DRESSINGS) IMPLANT
BINDER BREAST XLRG (GAUZE/BANDAGES/DRESSINGS) IMPLANT
BIOPATCH RED 1 DISK 7.0 (GAUZE/BANDAGES/DRESSINGS) ×2 IMPLANT
BLADE 10 SAFETY STRL DISP (BLADE) ×2 IMPLANT
CANISTER SUCTION 2500CC (MISCELLANEOUS) ×6 IMPLANT
CHLORAPREP W/TINT 26ML (MISCELLANEOUS) ×4 IMPLANT
CLIP APPLIE 9.375 MED OPEN (MISCELLANEOUS) ×1 IMPLANT
CONT SPEC 4OZ CLIKSEAL STRL BL (MISCELLANEOUS) ×2 IMPLANT
COVER PROBE W GEL 5X96 (DRAPES) ×2 IMPLANT
COVER SURGICAL LIGHT HANDLE (MISCELLANEOUS) ×4 IMPLANT
DERMABOND ADVANCED (GAUZE/BANDAGES/DRESSINGS) ×1
DERMABOND ADVANCED .7 DNX12 (GAUZE/BANDAGES/DRESSINGS) ×1 IMPLANT
DEVICE DISSECT PLASMABLAD 3.0S (MISCELLANEOUS) ×1 IMPLANT
DRAIN CHANNEL 19F RND (DRAIN) ×2 IMPLANT
DRAPE CHEST BREAST 15X10 FENES (DRAPES) ×2 IMPLANT
DRAPE ORTHO SPLIT 77X108 STRL (DRAPES) ×2
DRAPE PROXIMA HALF (DRAPES) ×4 IMPLANT
DRAPE SURG 17X23 STRL (DRAPES) ×4 IMPLANT
DRAPE SURG ORHT 6 SPLT 77X108 (DRAPES) ×2 IMPLANT
DRAPE WARM FLUID 44X44 (DRAPE) ×2 IMPLANT
DRSG PAD ABDOMINAL 8X10 ST (GAUZE/BANDAGES/DRESSINGS) ×4 IMPLANT
DRSG SORBAVIEW 3.5X5-5/16 MED (GAUZE/BANDAGES/DRESSINGS) ×2 IMPLANT
DRSG TELFA 3X8 NADH (GAUZE/BANDAGES/DRESSINGS) ×2 IMPLANT
ELECT BLADE 6.5 EXT (BLADE) IMPLANT
ELECT CAUTERY BLADE 6.4 (BLADE) ×4 IMPLANT
ELECT REM PT RETURN 9FT ADLT (ELECTROSURGICAL) ×4
ELECTRODE REM PT RTRN 9FT ADLT (ELECTROSURGICAL) ×2 IMPLANT
EVACUATOR SILICONE 100CC (DRAIN) ×2 IMPLANT
EVACUATOR SMOKE ACCUVAC VALLEY (FILTER) ×1
GAUZE SPONGE 4X4 12PLY STRL (GAUZE/BANDAGES/DRESSINGS) ×4 IMPLANT
GLOVE BIO SURGEON STRL SZ7.5 (GLOVE) ×2 IMPLANT
GLOVE BIOGEL PI IND STRL 6.5 (GLOVE) ×1 IMPLANT
GLOVE BIOGEL PI IND STRL 8 (GLOVE) ×1 IMPLANT
GLOVE BIOGEL PI INDICATOR 6.5 (GLOVE) ×1
GLOVE BIOGEL PI INDICATOR 8 (GLOVE) ×1
GLOVE SURG SIGNA 7.5 PF LTX (GLOVE) ×2 IMPLANT
GLOVE SURG SS PI 6.0 STRL IVOR (GLOVE) ×2 IMPLANT
GOWN STRL REUS W/ TWL LRG LVL3 (GOWN DISPOSABLE) ×3 IMPLANT
GOWN STRL REUS W/ TWL XL LVL3 (GOWN DISPOSABLE) ×2 IMPLANT
GOWN STRL REUS W/TWL LRG LVL3 (GOWN DISPOSABLE) ×3
GOWN STRL REUS W/TWL XL LVL3 (GOWN DISPOSABLE) ×2
GRAFT FLEX HD 4X16 THICK (Tissue Mesh) ×2 IMPLANT
ILLUMINATOR WAVEGUIDE N/F (MISCELLANEOUS) IMPLANT
IMPL SILICONE 600CC (Breast) ×1 IMPLANT
IMPLANT SILICONE 600CC (Breast) ×2 IMPLANT
KIT BASIN OR (CUSTOM PROCEDURE TRAY) ×4 IMPLANT
KIT ROOM TURNOVER OR (KITS) ×2 IMPLANT
LIGHT WAVEGUIDE WIDE FLAT (MISCELLANEOUS) IMPLANT
LIQUID BAND (GAUZE/BANDAGES/DRESSINGS) IMPLANT
MARKER SKIN DUAL TIP RULER LAB (MISCELLANEOUS) ×2 IMPLANT
MEMORYGEL GEL SIZER ×2 IMPLANT
NEEDLE 18GX1X1/2 (RX/OR ONLY) (NEEDLE) IMPLANT
NEEDLE FILTER BLUNT 18X 1/2SAF (NEEDLE)
NEEDLE FILTER BLUNT 18X1 1/2 (NEEDLE) IMPLANT
NEEDLE HYPO 25GX1X1/2 BEV (NEEDLE) IMPLANT
NS IRRIG 1000ML POUR BTL (IV SOLUTION) ×6 IMPLANT
PACK GENERAL/GYN (CUSTOM PROCEDURE TRAY) ×4 IMPLANT
PAD ARMBOARD 7.5X6 YLW CONV (MISCELLANEOUS) ×4 IMPLANT
PLASMABLADE 3.0S (MISCELLANEOUS) ×2
PREFILTER EVAC NS 1 1/3-3/8IN (MISCELLANEOUS) ×2 IMPLANT
SPECIMEN JAR X LARGE (MISCELLANEOUS) ×2 IMPLANT
STAPLER VISISTAT 35W (STAPLE) ×2 IMPLANT
SUT ETHILON 2 0 FS 18 (SUTURE) ×2 IMPLANT
SUT MNCRL AB 3-0 PS2 18 (SUTURE) ×4 IMPLANT
SUT MNCRL AB 4-0 PS2 18 (SUTURE) ×4 IMPLANT
SUT PDS AB 3-0 SH 27 (SUTURE) ×10 IMPLANT
SUT PROLENE 3 0 PS 2 (SUTURE) ×4 IMPLANT
SUT SILK 2 0 FS (SUTURE) ×2 IMPLANT
SUT VIC AB 3-0 SH 18 (SUTURE) ×4 IMPLANT
SYR BULB IRRIGATION 50ML (SYRINGE) ×2 IMPLANT
SYR CONTROL 10ML LL (SYRINGE) IMPLANT
TOWEL OR 17X24 6PK STRL BLUE (TOWEL DISPOSABLE) IMPLANT
TOWEL OR 17X26 10 PK STRL BLUE (TOWEL DISPOSABLE) ×4 IMPLANT
TRAY FOLEY CATH 16FR SILVER (SET/KITS/TRAYS/PACK) ×2 IMPLANT
TUBE CONNECTING 12X1/4 (SUCTIONS) ×6 IMPLANT

## 2016-10-09 NOTE — Anesthesia Postprocedure Evaluation (Signed)
Anesthesia Post Note  Patient: [name] Bell  Procedure(s) Performed: Procedure(s) (LRB): RIGHT MASTECTOMY WITH SENTINEL LYMPH NODE BIOPSY (Right) IMMEDIATE RIGHT BREAST RECONSTRUCTION WITH PLACEMENT OF SILICONE GEL IMPLANTS OR TISSUE EXPANDER AND ACELLULAR DERMAL MATRIX (Right)  Patient location during evaluation: PACU Anesthesia Type: General Level of consciousness: awake and alert Pain management: pain level controlled Vital Signs Assessment: post-procedure vital signs reviewed and stable Respiratory status: spontaneous breathing, nonlabored ventilation, respiratory function stable and patient connected to nasal cannula oxygen Cardiovascular status: blood pressure returned to baseline and stable Postop Assessment: no signs of nausea or vomiting Anesthetic complications: no    Last Vitals:  Vitals:   10/09/16 1204 10/09/16 1234  BP: (!) 164/83   Pulse: (!) 58   Resp: 11   Temp:  36.1 C    Last Pain:  Vitals:   10/09/16 1238  TempSrc:   PainSc: 8                  [name] Bell [name]

## 2016-10-09 NOTE — H&P (Signed)
[facility]  Location: [facility] Patient #: [id number] DOB: 1941/06/22 Married / Language: English / Race: White Female   History of Present Illness   Patient words: discuss surgery.   The patient is a 75 year old female who presents with breast cancer.   Her PCP is Dr. Marshall Cork.  Her neurologist is Dr. Alfonso Patten. Tat.  She is at the [facility] - she is seeing Drs. Christel Mormon.  She is her with her husband, [name].  She and her husband came back to discuss the options for treatment and review her pathology. I spent over 30 minutes going over the planned surgery.  The plan: 1) mastectomy with immediate reconstruction by Dr. [name]. I will probably go through her nipple incision, but do a skin sparin mastectomy. If Dr. [name] thinks that an inframammary approach would be better, I could go that way. 2) Lymph node biopsy. This is a soft call. All Ms. Saltz path has been a favorable DCIS and I expect her final pathology to show only that. But if we found an invasive ca, having already biopsied her right axillary node would provide a prognostic benefit. For now, she is okay with this. 3) She wants to schedule this in early October, when she gets back from [place], which I think is okay.  She also told me she became an American citizen on 07/01/2016 - he husband is already an Solicitor.   She is going to [place] at the end of August and to [place] in October (to be with her children).  History of breast cancer: She has not had a mammogram in years. Dr. Jenny Reichmann was after her to get a mammogram - he made an appt for her and she finally got one done. She had a breast augmentation in 2008 in [place]. She has no other breast history. She has no fam hx of breast cancer. Her last period was around 1995. She is not on hormones. She had a mammogram on 04/19/2015 at [facility] which showed calcifications in the right breast.  She had a detailed right mammogram on 04/30/2015 which showed 2.2 cm group of slightly suspicious calcifications located within the superior subareolar portion of the right breast. 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 This measured 2.2 on mammogram by Ca++ and 3.0 on MRI and goes to the nipple. Breast MRI - 05/08/2015 - Enhancement went post biopsy change located within the subareolar portion of the right breast in the area of the known DCIS via stereotactic biopsy. This enhancement extends to involve the nipple. The area of post biopsy change and enhancement measures 3.0 x 2.2 x 2.0 cm size. The area worrisome calcification by mammography measured 2.2 cm in size.  She had a right breast lumpectomy on 05/14/2015 (Accession: [id number]) - DCIS, 3.6 cm, low grade.  She underwent a recent mammogram at the [facility] that showed two areas of concern near her prior retroareolar lumpectomy. She had a biopsy of her right breast on 06/20/2016 [phone]) which showed DCIS (in anterior/medial aspect) and fibrocystic changes (in lateral aspect).  Past Medical History: 1. Cervical dystonia x 5 years - sees Dr. Carles Collet Treated with Botox with success for 2 years - her last treatment was last week. 2. Anxiety 3. Bilateral breast implants (subpectoral) Dr. Sylvan Cheese [facility] Work: [phone] FAX: [phone] 4. Back surgery - [facility] - 2011, this has gone well 5.  Right foot surgery x 2 - last in [place]. Hammer toe and bunion, this still bothers her. She had right foot surgery by Dr. Doran Durand about 5 weeks ago. She is going to rehab twice a week. She thinks that things are going pretty good. 6. Colonoscopy - 2007 - [place] 7. became an [employer] on 07/01/2016  Social History: Married. Her husband is with her. They have two sons: [name] (42), who is  with her and lives in [place], and [name] (43), who lives in [place]. Works as a Agricultural engineer. They are originally from [place]. She lost her first husband to colon cancer   Allergies Malachi Bonds, CMA; 07/03/2016 3:49 PM) No Known Drug Allergies05/26/2016  Medication History Malachi Bonds, CMA; 07/03/2016 3:49 PM) ClonazePAM (0.25MG  Tablet Disperse, Oral) Active. Hydrochlorothiazide (12.5MG  Capsule, Oral) Active. Glucosamine Chondroitin Complx (Oral) Active. CoQ10 (200MG  Capsule, Oral) Active. Vitamin B Complex-C (Oral) Active. Medications Reconciled  Vitals (Chemira Jones CMA; 07/03/2016 3:48 PM) 07/03/2016 3:48 PM Weight: 170 lb Height: 64in Body Surface Area: 1.83 m Body Mass Index: 29.18 kg/m  Temp.: 97.66F(Oral)  Pulse: 72 (Regular)  BP: 118/82 (Sitting, Left Arm, Standard)  Physical Exam  General: WN older WF alert and generally healthy appearing. HEENT: Normal. Pupils equal. She has a little bit of a torsion of her neck - but she just had a botox injection the same day that she had her breast biopsy.  Neck: Supple. No mass. No thyroid mass.  Lymph Nodes: No supraclavicular or cervical nodes.  Lungs: Clear to auscultation and symmetric breath sounds. Heart: RRR. No murmur or rub.  Breasts: Right - absent nipple, bruise at 1 o'clock and 5 o'clock, mid inframammary scar from implant. Implant balloons a little to the LOQ (this seems a little more noticeable on the right) Left - mid inframammary scar from implant. Implant balloons a little to the LOQ.  Abdomen: Soft. No mass. No tenderness. No hernia. Normal bowel sounds.   Extremities: Good strength and ROM in upper and lower extremities.   Assessment & Plan  1.  BREAST CANCER, STAGE 0, RIGHT (D05.91)  Story: She has a second right breast cancer found on biopsy on 06/20/2016 - low grade DCIS.  Plan:   1) To schedule for right mastectomy (through old incision), right axillary  sentinel lymph node biopsy, reconstruction by Dr. Baruch Goldmann  2.  BREAST CANCER, STAGE 0, RIGHT (D05.91)  Story: Right breast lumpectomy on 05/14/2015 (Accession: [id number]) - DCIS, 3.6 cm, low grade DCIS  3.  HISTORY OF BILATERAL BREAST IMPLANTS (Z98.82)  Bilateral breast implants (subpectoral)  Dr. Sylvan Cheese  [place]   Addendum Note(Myiah Petkus H. Lucia Gaskins MD; 08/04/2016 5:25 PM)  I spent 20+ minutes on the phone with Ms. Arlt.  She is considering bilateral mastectomies. I told her there was no medical reason for a left mastectomy, though some women want a prophylactic mastectomy for "peace of mind". After lengthy discussion, she is going to stay with the right sided mastectomy. I told her to talk to Dr. [name] about how "easy" it would be to do a left prophylactic mastectomy. The implant on the left is sub glandular.  She said that her husband has been diagnosed with Parkinson's disease.  4. Cervical dystonia x 5 years - sees Dr. Carles Collet Treated with Botox with success for 2 years - her last treatment was last week. 5. Anxiety  Work: [phone] [place]: [id number]. Back surgery - [facility] - 2011, this has gone well 7. Right foot surgery x 2 -  last in [place]. Hammer toe and bunion, this still bothers her.   Alphonsa Overall, MD, [facility] Surgery Pager: [phone] Office phone:  [phone]

## 2016-10-09 NOTE — Brief Op Note (Signed)
10/09/2016  11:02 AM  PATIENT:  [name] Bell  75 y.o. female  PRE-OPERATIVE DIAGNOSIS:  RIGHT BREAST CANCER  POST-OPERATIVE DIAGNOSIS:  RIGHT BREAST CANCER  PROCEDURE:  Procedure(s): RIGHT MASTECTOMY WITH SENTINEL LYMPH NODE BIOPSY (Right) IMMEDIATE RIGHT BREAST RECONSTRUCTION WITH PLACEMENT OF SILICONE GEL IMPLANTS OR TISSUE EXPANDER AND ACELLULAR DERMAL MATRIX (Right)  SURGEON:  Surgeon(s) and Role: Panel 1:    * Alphonsa Overall, MD - Primary  Panel 2:    * Crissie Reese, MD - Primary  PHYSICIAN ASSISTANT:   ASSISTANTS: Judyann Munson, RNFA   ANESTHESIA:   general  EBL:  Total I/O In: 1000 [I.V.:1000] Out: 37 [Urine:500; Blood:50]  BLOOD ADMINISTERED:none  DRAINS: (1) Jackson-Pratt drain(s) with closed bulb suction in the right mastectomy defect   LOCAL MEDICATIONS USED:  NONE  SPECIMEN:  No Specimen  DISPOSITION OF SPECIMEN:  N/A  COUNTS:  YES  TOURNIQUET:  * No tourniquets in log *  DICTATION: .Other Dictation: Dictation Number [id number]  PLAN OF CARE: Admit for overnight observation  PATIENT DISPOSITION:  PACU - hemodynamically stable.   Delay start of Pharmacological VTE agent (>24hrs) due to surgical blood loss or risk of bleeding: no

## 2016-10-09 NOTE — Progress Notes (Addendum)
At aprox. 1520 called to room by NT who noted blood in bed by JP. Several small clots and blood noted on gown and in bed under pt, best guess would be about 30-50 mls. Binder undone and JP dressing removed. Noted oozing from JP site and clots in tubing. Tubing stripped until tubing empty and suction present when JP bulb compressed.  4x4's and abd apl at JP site with tape compression.  Dr. [name] notified at 1545, he will be over to check pt. B/p and pulse ok. Dr [name] here around 1700, pt to return to the OR.

## 2016-10-09 NOTE — Brief Op Note (Signed)
10/09/2016  7:21 PM  PATIENT:  [facility]  75 y.o. female  PRE-OPERATIVE DIAGNOSIS:  HEMATOMA  POST-OPERATIVE DIAGNOSIS:  HEMATOMA  PROCEDURE:  Procedure(s): IRRIGATION AND DEBRIDEMENT BREAST HEMATOMA (Right)  SURGEON:  Surgeon(s) and Role:    * Crissie Reese, MD - Primary  PHYSICIAN ASSISTANT:   ASSISTANTS: none   ANESTHESIA:   general  EBL:  No intake/output data recorded.  BLOOD ADMINISTERED:none  DRAINS: (1) Jackson-Pratt drain(s) with closed bulb suction in the right mastectomy space.   LOCAL MEDICATIONS USED:  NONE  SPECIMEN:  No Specimen  DISPOSITION OF SPECIMEN:  N/A  COUNTS:  YES  TOURNIQUET:  * No tourniquets in log *  DICTATION: .Other Dictation: Dictation Number [id number]  PLAN OF CARE: Admit for overnight observation  PATIENT DISPOSITION:  PACU - hemodynamically stable.   Delay start of Pharmacological VTE agent (>24hrs) due to surgical blood loss or risk of bleeding: yes

## 2016-10-09 NOTE — Anesthesia Procedure Notes (Addendum)
Anesthesia Regional Block:  Pectoralis block  Pre-Anesthetic Checklist: ,, timeout performed, Correct Patient, Correct Site, Correct Laterality, Correct Procedure, Correct Position, site marked, Risks and benefits discussed,  Surgical consent,  Pre-op evaluation,  At surgeon's request and post-op pain management  Laterality: Right  Prep: chloraprep       Needles:  Injection technique: Single-shot     Needle Length: 9cm 9 cm Needle Gauge: 21 and 21 G    Additional Needles:  Procedures: ultrasound guided (picture in chart) Pectoralis block Narrative:  Start time: 10/09/2016 7:10 AM End time: 10/09/2016 7:20 AM Injection made incrementally with aspirations every 5 mL.  Performed by: Personally  Anesthesiologist: Lillia Abed  Additional Notes: Monitors applied. Patient sedated. Sterile prep and drape,hand hygiene and sterile gloves were used. Relevant anatomy identified.Needle position confirmed.Local anesthetic injected incrementally after negative aspiration. Local anesthetic spread visualized. Vascular puncture avoided. No complications. Image printed for medical record.The patient tolerated the procedure well.

## 2016-10-09 NOTE — Anesthesia Preprocedure Evaluation (Signed)
Anesthesia Evaluation  Patient identified by MRN, date of birth, ID band Patient awake    Reviewed: Allergy & Precautions, NPO status , Patient's Chart, lab work & pertinent test results  Airway Mallampati: I  TM Distance: >3 FB Neck ROM: Full    Dental   Pulmonary    Pulmonary exam normal        Cardiovascular Normal cardiovascular exam     Neuro/Psych    GI/Hepatic GERD  Medicated and Controlled,  Endo/Other    Renal/GU      Musculoskeletal   Abdominal   Peds  Hematology   Anesthesia Other Findings   Reproductive/Obstetrics                             Anesthesia Physical Anesthesia Plan  ASA: II  Anesthesia Plan: General   Post-op Pain Management:  Regional for Post-op pain   Induction: Intravenous  Airway Management Planned: Oral ETT  Additional Equipment:   Intra-op Plan:   Post-operative Plan: Extubation in OR  Informed Consent: I have reviewed the patients History and Physical, chart, labs and discussed the procedure including the risks, benefits and alternatives for the proposed anesthesia with the patient or authorized representative who has indicated his/her understanding and acceptance.     Plan Discussed with: CRNA and Surgeon  Anesthesia Plan Comments:         Anesthesia Quick Evaluation

## 2016-10-09 NOTE — Anesthesia Postprocedure Evaluation (Signed)
Anesthesia Post Note  Patient: [name] Bell  Procedure(s) Performed: Procedure(s) (LRB): IRRIGATION AND DEBRIDEMENT BREAST HEMATOMA (Right)  Patient location during evaluation: PACU Anesthesia Type: General Level of consciousness: awake, awake and alert and oriented Pain management: pain level controlled Vital Signs Assessment: post-procedure vital signs reviewed and stable Respiratory status: spontaneous breathing, nonlabored ventilation and respiratory function stable Anesthetic complications: no    Last Vitals:  Vitals:   10/09/16 2015 10/09/16 2026  BP:  (!) 142/71  Pulse:  73  Resp:    Temp: 37.2 C 36.5 C    Last Pain:  Vitals:   10/09/16 2026  TempSrc: Oral  PainSc:                  Kenton Fortin COKER

## 2016-10-09 NOTE — Transfer of Care (Signed)
Immediate Anesthesia Transfer of Care Note  Patient: [name] Bell  Procedure(s) Performed: Procedure(s): IRRIGATION AND DEBRIDEMENT BREAST HEMATOMA (Right)  Patient Location: PACU  Anesthesia Type:General  Level of Consciousness: awake  Airway & Oxygen Therapy: Patient Spontanous Breathing  Post-op Assessment: Report given to RN and Post -op Vital signs reviewed and stable  Post vital signs: Reviewed and stable  Last Vitals:  Vitals:   10/09/16 1234 10/09/16 1400  BP: (!) 158/82 135/69  Pulse: (!) 54 (!) 47  Resp: 13   Temp: 36.1 C 36.5 C    Last Pain:  Vitals:   10/09/16 1420  TempSrc:   PainSc: 5       Patients Stated Pain Goal: 3 ([id number])  Complications: No apparent anesthesia complications

## 2016-10-09 NOTE — Transfer of Care (Signed)
Immediate Anesthesia Transfer of Care Note  Patient: [name] Bell  Procedure(s) Performed: Procedure(s): RIGHT MASTECTOMY WITH SENTINEL LYMPH NODE BIOPSY (Right) IMMEDIATE RIGHT BREAST RECONSTRUCTION WITH PLACEMENT OF SILICONE GEL IMPLANTS OR TISSUE EXPANDER AND ACELLULAR DERMAL MATRIX (Right)  Patient Location: PACU  Anesthesia Type:General  Level of Consciousness: awake, alert  and oriented  Airway & Oxygen Therapy: Patient Spontanous Breathing and Patient connected to nasal cannula oxygen  Post-op Assessment: Report given to RN and Post -op Vital signs reviewed and stable  Post vital signs: Reviewed and stable  Last Vitals:  Vitals:   10/09/16 0648  BP: (!) 179/80  Pulse: (!) 58  Resp: 18  Temp: 36.4 C    Last Pain:  Vitals:   10/09/16 0648  TempSrc: Oral         Complications: No apparent anesthesia complications

## 2016-10-09 NOTE — Anesthesia Procedure Notes (Signed)
Procedure Name: Intubation Date/Time: 10/09/2016 6:17 PM Performed by: Ollen Bowl Pre-anesthesia Checklist: Patient identified, Emergency Drugs available, Suction available, Patient being monitored and Timeout performed Patient Re-evaluated:Patient Re-evaluated prior to inductionOxygen Delivery Method: Circle system utilized and Simple face mask Preoxygenation: Pre-oxygenation with 100% oxygen Intubation Type: Combination inhalational/ intravenous induction Ventilation: Mask ventilation without difficulty Laryngoscope Size: Miller and 2 Grade View: Grade III Tube type: Oral Tube size: 7.5 mm Number of attempts: 2 Airway Equipment and Method: Patient positioned with wedge pillow and Stylet Placement Confirmation: ETT inserted through vocal cords under direct vision,  positive ETCO2 and breath sounds checked- equal and bilateral Secured at: 22 cm Tube secured with: Tape Dental Injury: Teeth and Oropharynx as per pre-operative assessment  Difficulty Due To: Difficulty was anticipated, Difficult Airway- due to anterior larynx and Difficult Airway- due to limited oral opening Future Recommendations: Recommend- induction with short-acting agent, and alternative techniques readily available

## 2016-10-09 NOTE — Anesthesia Preprocedure Evaluation (Signed)
Anesthesia Evaluation  Patient identified by MRN, date of birth, ID band Patient awake    Reviewed: Allergy & Precautions, NPO status , Patient's Chart, lab work & pertinent test results  Airway Mallampati: I  TM Distance: >3 FB Neck ROM: Full    Dental  (+) Teeth Intact, Dental Advisory Given   Pulmonary    breath sounds clear to auscultation       Cardiovascular  Rhythm:Regular Rate:Normal     Neuro/Psych    GI/Hepatic GERD  Medicated and Controlled,  Endo/Other    Renal/GU      Musculoskeletal   Abdominal   Peds  Hematology   Anesthesia Other Findings   Reproductive/Obstetrics                             Anesthesia Physical Anesthesia Plan  ASA: II and emergent  Anesthesia Plan: General   Post-op Pain Management:    Induction: Intravenous  Airway Management Planned: Oral ETT  Additional Equipment:   Intra-op Plan:   Post-operative Plan: Extubation in OR  Informed Consent: I have reviewed the patients History and Physical, chart, labs and discussed the procedure including the risks, benefits and alternatives for the proposed anesthesia with the patient or authorized representative who has indicated his/her understanding and acceptance.   Dental advisory given  Plan Discussed with: CRNA, Anesthesiologist and Surgeon  Anesthesia Plan Comments:         Anesthesia Quick Evaluation

## 2016-10-09 NOTE — Progress Notes (Signed)
I was called to see patient for blood in drain bulb and from around the drain.  It appears there is a collection of blood in the mastectomy space. This should be evacuated.   Plan: Return to surgery for evacuation of hematoma.

## 2016-10-09 NOTE — Anesthesia Procedure Notes (Signed)
Procedure Name: Intubation Date/Time: 10/09/2016 8:15 AM Performed by: Candis Shine Pre-anesthesia Checklist: Patient identified, Emergency Drugs available, Suction available and Patient being monitored Patient Re-evaluated:Patient Re-evaluated prior to inductionOxygen Delivery Method: Circle System Utilized Preoxygenation: Pre-oxygenation with 100% oxygen Intubation Type: IV induction Ventilation: Mask ventilation without difficulty Laryngoscope Size: Mac and 3 Grade View: Grade II Tube type: Oral Tube size: 7.0 mm Number of attempts: 1 Airway Equipment and Method: Stylet Placement Confirmation: ETT inserted through vocal cords under direct vision,  positive ETCO2 and breath sounds checked- equal and bilateral Secured at: 23 cm Tube secured with: Tape Dental Injury: Teeth and Oropharynx as per pre-operative assessment

## 2016-10-09 NOTE — Op Note (Signed)
10/09/2016  9:47 AM  PATIENT:  [name] Bell, 75 y.o., female, MRN: [id number]  PREOP DIAGNOSIS:  RIGHT BREAST CANCER, right breast implant  POSTOP DIAGNOSIS:   Right breast cancer, 1 o'clock position (Tis, N0), right breast implant  PROCEDURE:   Procedure(s): RIGHT MASTECTOMY WITH SENTINEL LYMPH NODE BIOPSY,  IMMEDIATE RIGHT BREAST RECONSTRUCTION WITH PLACEMENT OF SILICONE GEL IMPLANTS OR TISSUE EXPANDER AND ACELLULAR DERMAL MATRIX  SURGEON:   Alphonsa Overall, M.D.  ASSISTANT:   None  ANESTHESIA:   general  Anesthesiologist: Lillia Abed, MD CRNA: Maryland Pink, CRNA; Candis Shine, CRNA  General  ASA:  2  EBL:  75  ml  BLOOD ADMINISTERED: none  DRAINS: none   LOCAL MEDICATIONS USED:   Right pectoral block  SPECIMEN:   Right breast (suture lateral), right axillary sentinel lymph node biopsy (counts - 200, background - 0)  COUNTS CORRECT:  YES  INDICATIONS FOR PROCEDURE:  [name] Bell is a 75 y.o. (DOB: 1941/03/19) white female whose primary care physician is Cathlean Cower, MD and comes for right mastectomy for recurrent DCIS of right breast.   The indications and risks of the surgery were explained to the patient.  The risks include, but are not limited to, infection, bleeding, and nerve injury.  OPERATIVE NOTE;  The patient was taken to room # 9 at [place] where she underwent a general anesthesia  supervised by Anesthesiologist: Lillia Abed, MD CRNA: Maryland Pink, CRNA; Candis Shine, CRNA. Both her breast and axilla were prepped with ChloraPrep and sterilely draped.    A time-out and the surgical check list was reviewed.    I excised the scar from the prior nipple excision.  The scar sits high on the right breast.  I developed skin flaps medially to the lateral edge of the sternum, inferiorly to the investing fascia of the rectus abdominus muscle, laterally to the anterior edge of the latissimus dorsi muscle, and superiorly to about 2 finger breaths below  the clavicle.  The breast was reflected off the pectoralis muscle from medial to lateral.  The lateral attachments in the right axilla were divided and the breast removed.  A long suture was placed on the lateral aspect of the breast.  The implant was subpectoral and separate from the breast.  I did not have to remove the implant sac, in that it is separate from the breast.  The pectoralis muscle was thinned out over the implant.   I dissected into the right axilla and found a sentinel lymph node.  The node had counts of 200 with a background count of 0.  The lymph node was blue.  This was sent as a separate specimen.   I brought out 2 55 F Blake drains below the inferior flaps.  These were sewn in place with 2-0 Nylons.  I irrigated the wound with 2,000 cc of fluid.   The skin was closed with interrupted 3-0 Vicryl sutures and the skin was closed with a 4-0 Monocryl.  The  Wound was painted with Tincture of Benzoin and steristripped with 1/2 inch Steristrips.    At this point, I had completed the mastectomy, removed the breast implant, and completed the right axillary sentinel lymph node biopsy.  Dr. [name] scrubbed in for the immediate breast reconstruction and will dictate that part of the operation.  Alphonsa Overall, MD, [facility] Surgery Pager: [phone] Office phone:  [phone]

## 2016-10-10 ENCOUNTER — Encounter (HOSPITAL_COMMUNITY): Payer: Self-pay | Admitting: Plastic Surgery

## 2016-10-10 DIAGNOSIS — N6011 Diffuse cystic mastopathy of right breast: Secondary | ICD-10-CM | POA: Diagnosis not present

## 2016-10-10 DIAGNOSIS — L7632 Postprocedural hematoma of skin and subcutaneous tissue following other procedure: Secondary | ICD-10-CM | POA: Diagnosis not present

## 2016-10-10 DIAGNOSIS — F419 Anxiety disorder, unspecified: Secondary | ICD-10-CM | POA: Diagnosis not present

## 2016-10-10 DIAGNOSIS — K219 Gastro-esophageal reflux disease without esophagitis: Secondary | ICD-10-CM | POA: Diagnosis not present

## 2016-10-10 DIAGNOSIS — D0511 Intraductal carcinoma in situ of right breast: Secondary | ICD-10-CM | POA: Diagnosis not present

## 2016-10-10 DIAGNOSIS — G249 Dystonia, unspecified: Secondary | ICD-10-CM | POA: Diagnosis not present

## 2016-10-10 MED ORDER — METHOCARBAMOL 500 MG PO TABS
500.0000 mg | ORAL_TABLET | Freq: Four times a day (QID) | ORAL | Status: DC
  Administered 2016-10-10 – 2016-10-11 (×4): 500 mg via ORAL
  Filled 2016-10-10 (×4): qty 1

## 2016-10-10 NOTE — Op Note (Signed)
[name] Bell, [name] NO.:  [id number]  MEDICAL RECORD NO.:  [id number]  LOCATION:  6N01C                        FACILITY:  [facility]  PHYSICIAN:  Crissie Reese, M.D.     DATE OF BIRTH:  [birth year]  DATE OF PROCEDURE:  10/09/2016 DATE OF DISCHARGE:                              OPERATIVE REPORT   PREOPERATIVE DIAGNOSIS:  Postoperative hematoma, right mastectomy space.  POSTOPERATIVE DIAGNOSIS:  Postoperative hematoma, right mastectomy space.  PROCEDURE PERFORMED:  Evacuation and drainage of hematoma, right mastectomy space.  SURGEON:  Crissie Reese, M.D.  ANESTHESIA:  General.  ESTIMATED BLOOD LOSS:  40 mL.  CLINICAL NOTE:  This 75 year old woman had a right skin-sparing mastectomy earlier today and had immediate reconstruction with an implant and acellular dermal matrix.  She did well initially, but on the floor this evening, the nurses noted some bleeding from around the drain and through the drain.  The feeling was that this would necessitate a return to the operating room for evacuation and drainage.  The nature of the procedure risks discussed with her and she understood and wished to proceed.  DESCRIPTION OF PROCEDURE:  The patient was taken to the operating room and placed supine.  After successful induction of general anesthesia, the old drain was removed.  She was then prepped with Betadine and draped with sterile drapes.  The incision was then opened and careful inspection of space.  There was some blood clot especially inferiorly at the area where the drain exited the mastectomy space for the skin.  It was felt that the bleeding was most likely along the drain tract itself. There was little bit of blood along the lateral aspect that had backed up from there.  No bleeding vessels identified.  Thorough irrigation with saline as well as antibiotic solution and again a careful inspection was made.  No evidence of any bleeding from the  mastectomy space.  At this point, it was noted that there was some continual oozing from the old drain site consistent with bleeding along the drain tract. This was applied pressure and this seemed to control the bleeding along the tract.  A new drain having been positioned and secured with 3-0 Prolene suture, the closure with 3-0 Monocryl interrupted inverted deep dermal sutures and 3-0 Prolene simple interrupted sutures.  Some antibiotic ointment applied.  Dry sterile dressings and then pressure placed over the old drain track and Ace wraps were placed.  She was transferred to the recovery room stable having tolerated the procedure well.  DISPOSITION:  She will be observed overnight.     Crissie Reese, M.D.     DB/MEDQ  D:  10/09/2016  T:  10/10/2016  Job:  [id number]

## 2016-10-10 NOTE — Op Note (Signed)
NAMEBRIGGS, LYERLY NO.:  [id number]  MEDICAL RECORD NO.:  [id number]  LOCATION:                                 FACILITY:  PHYSICIAN:  Crissie Reese, M.D.     DATE OF BIRTH:  [birth year]  DATE OF PROCEDURE: DATE OF DISCHARGE:                              OPERATIVE REPORT   PREOPERATIVE DIAGNOSIS:  Right breast cancer.  POSTOPERATIVE DIAGNOSIS:  Right breast cancer.  PROCEDURES PERFORMED: 1. Immediate right breast reconstruction with a 0000000 mL Mentor silicone     gel implant. 2. Distinct procedure, right chest wall reconstruction with acellular     dermal matrix for inadequate muscle coverage and re-setting of     inframammary fold.  SURGEON:  Crissie Reese, M.D.  ASSISTANT:  Judyann Munson, RNFA.  ANESTHESIA:  General.  ESTIMATED BLOOD LOSS:  3 mL.  DRAINS:  One 19-French.  CLINICAL NOTE:  A 75 year old woman who has had breast cancer, had couple of lumpectomies including a central lumpectomy that removed her nipple-areolar complex.  This failed to clear her margins and she now presents for mastectomy.  She was interested in reconstruction and the procedure was discussed with her in detail.  She has had a previous breast augmentation.  She had 300 mL implants placed at that time.  With a skin-sparing mastectomy, it was felt that she may be a candidate for direct implant reconstruction and avoid a tissue expander.  This was discussed with the patient.  Estimate was made that her volume would be approximately 600 mL total.  She had 300 mL implant place in her breast, tissue was estimated to be approximately 300 g.  She understood this plan and understood the use of acellular dermal matrix in order to supplement the underlying muscle and allow for more adequate deep coverage of the implant.  Risks and possible complications were discussed with her that included, but were not limited to, bleeding, infection, healing problems, scarring, loss of  sensation, fluid accumulations, pneumothorax, DVT, PE, asymmetry, contour deformities, contour deformities of the periphery of the reconstruction, loss of tissue, loss of skin, failure of the device, capsular contracture, displacement of device, wrinkles and ripples, chronic pain and overall disappointment.  She understood all this and wished to proceed.  DESCRIPTION OF PROCEDURE:  The patient was taken to the operating room and the mastectomy had been completed.  This did in fact turned out to be a skin-sparing mastectomy and the quality of the skin looked very good.  She was irrigated with saline and then antibiotic solution.  The acellular dermal matrix was prepared, soaking it in saline followed by antibiotic solution.  It was also stripped of its preservative fluids as well.  The acellular dermal matrix was pie-crusted and then returned to the antibiotic solution.  After thoroughly cleaning gloves, a 19-French drain was positioned, brought out through the separate stab wound inferolaterally and secured with a 3-0 Prolene suture.  The Sizer was placed and the 600 mL appeared to be appropriate.  The implant was indeed a 300 mL implant and her breast tissue weighed 314 g.  The acellular matrix was then sutured along the lateral border first using  3- 0 PDS simple interrupted sutures with the acellular dermal matrix oriented with the epidermal side towards the implant cavity and the dermal side facing upwards toward the overlying mastectomy flaps.  This having been completed, and again after thoroughly cleaning gloves, the 0000000 mL Mentor silicone gel implant high-profile was then positioned.  It had been soaked in antibiotic solution prior to positioning.  Antibiotic solution was again placed in this space prior to placing the implant. The remainder of the closure of muscle to the medial edge of the ADM was performed using 3-0 Prolene simple interrupted sutures with great care taken to  avoid any damage to underlying implant, which was kept under direct vision at all times.  Again, irrigation with saline as well as antibiotic solution and the skin closure, 3-0 Monocryl interrupted inverted deep dermal sutures and 3-0 Prolene simple interrupted sutures. The Telfa, dry sterile dressing and the breast vest were then applied. A Biopatch and SorbaView dressing for the drain.  She tolerated the procedure well.     Crissie Reese, M.D.   ______________________________ Crissie Reese, M.D.    DB/MEDQ  D:  10/09/2016  T:  10/10/2016  Job:  [id number]

## 2016-10-10 NOTE — Progress Notes (Signed)
Subjective: Feels okay. Pain improved from yesterday.  Objective: Vital signs in last 24 hours: Temp:  [97 F (36.1 C)-99 F (37.2 C)] 98.1 F (36.7 C) (10/13 0601) Pulse Rate:  [47-84] 79 (10/13 0601) Resp:  [11-27] 16 (10/13 0601) BP: (112-172)/(54-84) 120/60 (10/13 0601) SpO2:  [83 %-100 %] 97 % (10/13 0601) Weight:  [164 lb 14.5 oz (74.8 kg)] 164 lb 14.5 oz (74.8 kg) (10/12 1400)  Intake/Output from previous day: 10/12 0701 - 10/13 0700 In: 4192.5 [P.O.:140; I.V.:3902.5; IV Piggyback:150] Out: 1880 [id number]; Drains:155; Blood:75] Intake/Output this shift: No intake/output data recorded.  Operative sites: Mastectomy flaps viable. Good color. Implant in good position. Drain functioning. Drainage thin. There is no evidence of bleeding..  No results for input(s): WBC, HGB, HCT, NA, K, CL, CO2, BUN, CREATININE, GLU in the last 72 hours.  Invalid input(s): PLATELETS  Studies/Results: Nm Sentinel Node Inj-no Rpt (breast)  Result Date: 10/09/2016 CLINICAL DATA: right breast cancer Sulfur colloid was injected intradermally by the nuclear medicine technologist for breast cancer sentinel node localization.    Assessment/Plan: Ambulate. Discharge tomorrow if doing well.  LOS: 0 days    Crissie Reese M 10/10/2016 8:44 AM

## 2016-10-10 NOTE — Progress Notes (Signed)
[facility]:  [phone] General Surgery Progress Note   LOS: 0 days  POD -  1 Day Post-Op  Assessment/Plan: 1.  RIGHT MASTECTOMY WITH SENTINEL LYMPH NODE BIOPSY, IMMEDIATE RIGHT BREAST RECONSTRUCTION WITH PLACEMENT OF SILICONE GEL IMPLANTS OR TISSUE EXPANDER AND ACELLULAR DERMAL MATRIX - 10/09/2016 - [name] Bell/[name] Bell  For right breast DCIS  On Ancef  Drain - 155 cc recorded last 24 hours  Doing well, though anxious  Okay to go home from my standpoint.  2.  IRRIGATION AND DEBRIDEMENT BREAST HEMATOMA - 10/09/2016 - [name] Bell   3.  HISTORY OF BILATERAL BREAST IMPLANTS (Z98.82)             Bilateral breast implants (subpectoral)       Dr. Sylvan Bell,  [place] 4. Cervical dystonia x 5 years - sees Dr. Carles Collet Treated with Botox with success for 2 years - her last treatment was last week. 5. Anxiety 6. Back surgery - [name] Bell - 2011, this has gone well 7. Right foot surgery x 2 - last in [place].    Active Problems:   Breast cancer, right (HCC)   Subjective:  Did not sleep well last PM.  She called her sister in [place] and that helped.  Anxious about surgery and pathology.  Objective:   Vitals:   10/10/16 0245 10/10/16 0601  BP: (!) 112/54 120/60  Pulse: 69 79  Resp: 16 16  Temp: 98 F (36.7 C) 98.1 F (36.7 C)     Intake/Output from previous day:  10/12 0701 - 10/13 0700 In: 4192.5 [P.O.:140; I.V.:3902.5; IV Piggyback:150] Out: 1880 [id number]; Drains:155; Blood:75]  Intake/Output this shift:  No intake/output data recorded.   Physical Exam:   General: WN WF who is alert and oriented.    HEENT: Normal. Pupils equal. .   Lungs: Clear      Wound: Okay.  Drain - 155 cc recorded    Lab Results:   No results for input(s): WBC, HGB, HCT, PLT in the last 72 hours.  BMET  No results for input(s): NA, K, CL, CO2, GLUCOSE, BUN, CREATININE, CALCIUM in the last 72 hours.  PT/INR  No results for input(s): LABPROT, INR in the last 72  hours.  ABG  No results for input(s): PHART, HCO3 in the last 72 hours.  Invalid input(s): PCO2, PO2   Studies/Results:  Nm Sentinel Node Inj-no Rpt (breast)  Result Date: 10/09/2016 CLINICAL DATA: right breast cancer Sulfur colloid was injected intradermally by the nuclear medicine technologist for breast cancer sentinel node localization.     Anti-infectives:   Anti-infectives    Start     Dose/Rate Route Frequency Ordered Stop   10/09/16 1829  polymyxin B 500,000 Units, bacitracin 50,000 Units in sodium chloride irrigation 0.9 % 500 mL irrigation  Status:  Discontinued       As needed 10/09/16 1829 10/09/16 1919   10/09/16 1400  ceFAZolin (ANCEF) IVPB 1 g/50 mL premix     1 g 100 mL/hr over 30 Minutes Intravenous Every 6 hours 10/09/16 1310     10/09/16 0745  bacitracin 50,000 Units, gentamicin (GARAMYCIN) 80 mg, ceFAZolin (ANCEF) 1 g in sodium chloride 0.9 % 1,000 mL      Irrigation Once 10/09/16 0734 10/09/16 1018   10/09/16 0700  ceFAZolin (ANCEF) IVPB 2g/100 mL premix     2 g 200 mL/hr over 30 Minutes Intravenous To ShortStay Surgical 10/08/16 0751 10/09/16 0820      Alphonsa Overall, MD, FACS Pager: [place]  [facility] Office: [phone] 10/10/2016

## 2016-10-10 NOTE — Care Management Obs Status (Signed)
[facility] NOTIFICATION   Patient Details  Name: MITCHEL TAVAREZ MRN: [id number] Date of Birth: August 01, 1941   Medicare Observation Status Notification Given:  Yes   Left at bedside. Patient did not wish to sign.  Marilu Favre, RN 10/10/2016, 10:53 AM

## 2016-10-11 DIAGNOSIS — D0511 Intraductal carcinoma in situ of right breast: Secondary | ICD-10-CM | POA: Diagnosis not present

## 2016-10-11 DIAGNOSIS — K219 Gastro-esophageal reflux disease without esophagitis: Secondary | ICD-10-CM | POA: Diagnosis not present

## 2016-10-11 DIAGNOSIS — N6011 Diffuse cystic mastopathy of right breast: Secondary | ICD-10-CM | POA: Diagnosis not present

## 2016-10-11 DIAGNOSIS — F419 Anxiety disorder, unspecified: Secondary | ICD-10-CM | POA: Diagnosis not present

## 2016-10-11 DIAGNOSIS — G249 Dystonia, unspecified: Secondary | ICD-10-CM | POA: Diagnosis not present

## 2016-10-11 DIAGNOSIS — L7632 Postprocedural hematoma of skin and subcutaneous tissue following other procedure: Secondary | ICD-10-CM | POA: Diagnosis not present

## 2016-10-11 MED ORDER — DOXYCYCLINE HYCLATE 100 MG PO TABS
100.0000 mg | ORAL_TABLET | Freq: Two times a day (BID) | ORAL | Status: DC
Start: 2016-10-11 — End: 2016-10-11

## 2016-10-11 MED ORDER — DOCUSATE SODIUM 100 MG PO CAPS: 100.0000 mg | ORAL_CAPSULE | Freq: Every day | ORAL | 0 refills | Status: DC

## 2016-10-11 MED ORDER — METHOCARBAMOL 500 MG PO TABS: 500.0000 mg | ORAL_TABLET | Freq: Four times a day (QID) | ORAL | 0 refills | Status: DC

## 2016-10-11 MED ORDER — DOXYCYCLINE HYCLATE 100 MG PO TABS: 100.0000 mg | ORAL_TABLET | Freq: Two times a day (BID) | ORAL | 0 refills | Status: DC

## 2016-10-11 MED ORDER — HYDROMORPHONE HCL 2 MG PO TABS: 2.0000 mg | ORAL_TABLET | ORAL | 0 refills | Status: DC | PRN

## 2016-10-11 NOTE — Progress Notes (Signed)
Discharge home. Home discharge instruction given, no question verbalized. 

## 2016-10-11 NOTE — Discharge Instructions (Addendum)
No lifting for 6 weeks No vigorous activity for 6 weeks (including outdoor walks) No raising arms overhead No driving for 4 weeks OK to walk up stairs slowly Stay propped up Use incentive spirometer at home every hour while awake No shower while drains are in place Empty drains at least three times a day and record the amounts separately Take an over-the-counter Probiotic while on antibiotics Take an over-the-counter stool softener (such as Colace) while on pain medication

## 2016-10-11 NOTE — Discharge Summary (Signed)
Physician Discharge Summary  Patient ID: [name] Bell MRN: [id number] DOB/AGE: Jun 18, 1941 75 y.o.  Admit date: 10/09/2016 Discharge date: 10/11/2016  Admission Diagnoses: Right breast cancer  Discharge Diagnoses: Same Active Problems:   Breast cancer, right [facility])   Discharged Condition: good  Hospital Course: On the day of admission the patient was taken to surgery and had right mastectomy and reconstruction with silicone gel implant and ADM. The patient tolerated the procedures well. Postoperatively, she developed bleeding from the drain site. She was returned to surgery for incision and drainage of hematoma. This turned out to be bleeding from the drain tract itself. Total blood loss was not significant but there was enough blood in the mastectomy space that may have had an adverse effect on wound healing. The patient was ambulatory and tolerating diet on the first postoperative day. By the second day, the drain had decreased and drainage was essentially clear. She would like to go home.  Treatments: antibiotics: Ancef, anticoagulation: heparin (pre-op) and surgery: 10 Mastectomy and reconstruction with ADM and silicone gel implant (0000000 cc high profile) and 2) Evacuation and drainage of hematoma right chest.  Discharge Exam: Blood pressure (!) 145/60, pulse 64, temperature 98.9 F (37.2 C), temperature source Oral, resp. rate 18, height 5\' 4"  (1.626 m), weight 164 lb 14.5 oz (74.8 kg), SpO2 96 %.  Operative sites: Mastectomy flaps good color. Viable. Implant in good position. Drains functioning. Drainage thin. No evidence of bleeding or infection.  Disposition: 01-Home or Self Care     Medication List    STOP taking these medications   CO Q 10 PO   GLUCOSAMINE PO   HAIR/SKIN/NAILS PO   multivitamin with minerals Tabs tablet     TAKE these medications   clobetasol ointment 0.05 % Commonly known as:  TEMOVATE Apply 1 application topically 2 (two) times daily as needed  (for rash).   clonazePAM 1 MG tablet Commonly known as:  KLONOPIN Take 1 tablet (1 mg total) by mouth 2 (two) times daily as needed for anxiety.   docusate sodium 100 MG capsule Commonly known as:  COLACE Take 1 capsule (100 mg total) by mouth daily. Start taking on:  10/12/2016   doxycycline 100 MG tablet Commonly known as:  VIBRA-TABS Take 1 tablet (100 mg total) by mouth every 12 (twelve) hours.   fluocinonide-emollient 0.05 % cream Commonly known as:  LIDEX-E Apply 1 application topically 2 (two) times daily.   fluticasone 50 MCG/ACT nasal spray Commonly known as:  FLONASE USE 2 SPRAYS INTO BOTH NOSTRILS DAILY   hydrochlorothiazide 25 MG tablet Commonly known as:  HYDRODIURIL Take 25 mg by mouth daily as needed (for fluid).   HYDROmorphone 2 MG tablet Commonly known as:  DILAUDID Take 1-2 tablets (2-4 mg total) by mouth every 4 (four) hours as needed for moderate pain.   methocarbamol 500 MG tablet Commonly known as:  ROBAXIN Take 1 tablet (500 mg total) by mouth 4 (four) times daily.   tretinoin 0.025 % cream Commonly known as:  RETIN-A Apply topically at bedtime. What changed:  Another medication with the same name was removed. Continue taking this medication, and follow the directions you see here.        SignedHarlow Mares, Rider Ermis M 10/11/2016, 10:14 AM

## 2016-10-14 ENCOUNTER — Encounter (HOSPITAL_COMMUNITY): Payer: Self-pay | Admitting: Surgery

## 2016-10-16 ENCOUNTER — Ambulatory Visit: Payer: Medicare Other | Admitting: Internal Medicine

## 2016-10-22 ENCOUNTER — Encounter (HOSPITAL_COMMUNITY): Payer: Self-pay | Admitting: Surgery

## 2016-10-30 ENCOUNTER — Telehealth: Payer: Self-pay | Admitting: Internal Medicine

## 2016-10-30 NOTE — Telephone Encounter (Signed)
Called [name] Bell to schedule awv appt. Left msg for patient to call back to schedule appt.

## 2016-11-03 ENCOUNTER — Encounter: Payer: Self-pay | Admitting: Internal Medicine

## 2016-11-03 DIAGNOSIS — L308 Other specified dermatitis: Secondary | ICD-10-CM | POA: Diagnosis not present

## 2016-11-03 DIAGNOSIS — L570 Actinic keratosis: Secondary | ICD-10-CM | POA: Diagnosis not present

## 2016-11-08 ENCOUNTER — Encounter (HOSPITAL_COMMUNITY): Payer: Self-pay | Admitting: Surgery

## 2016-11-18 DIAGNOSIS — H2513 Age-related nuclear cataract, bilateral: Secondary | ICD-10-CM | POA: Diagnosis not present

## 2016-11-18 DIAGNOSIS — H43813 Vitreous degeneration, bilateral: Secondary | ICD-10-CM | POA: Diagnosis not present

## 2016-11-21 ENCOUNTER — Encounter (HOSPITAL_COMMUNITY): Payer: Self-pay | Admitting: Surgery

## 2016-12-02 ENCOUNTER — Ambulatory Visit (INDEPENDENT_AMBULATORY_CARE_PROVIDER_SITE_OTHER): Payer: Medicare Other | Admitting: Internal Medicine

## 2016-12-02 VITALS — BP 138/78 | HR 63 | Temp 98.0°F | Resp 20 | Wt 169.0 lb

## 2016-12-02 DIAGNOSIS — E785 Hyperlipidemia, unspecified: Secondary | ICD-10-CM

## 2016-12-02 DIAGNOSIS — Z23 Encounter for immunization: Secondary | ICD-10-CM

## 2016-12-02 DIAGNOSIS — R7302 Impaired glucose tolerance (oral): Secondary | ICD-10-CM

## 2016-12-02 DIAGNOSIS — R21 Rash and other nonspecific skin eruption: Secondary | ICD-10-CM

## 2016-12-02 NOTE — Progress Notes (Signed)
Subjective:    Patient ID: [name] Bell, female    DOB: September 26, 1941, 75 y.o.   MRN: [id number]  HPI  Here for yearly f/u;  Overall doing ok;  Pt denies Chest pain, worsening SOB, DOE, wheezing, orthopnea, PND, worsening LE edema, palpitations, dizziness or syncope.  Pt denies neurological change such as new headache, facial or extremity weakness.  Pt denies polydipsia, polyuria, or low sugar symptoms. Pt states overall good compliance with treatment and medications, good tolerability, and has been trying to follow appropriate diet.  Pt denies worsening depressive symptoms, suicidal ideation or panic. No fever, night sweats, wt loss, loss of appetite, or other constitutional symptoms.  Pt states good ability with ADL's, has low fall risk, home safety reviewed and adequate, no other significant changes in hearing or vision, and s/p recent right mastectomy for Breast ca, s/p recontructive  Also seen per PT due to foot pain, which has seemed to help, as long as does not wear higher years. Now trying to get more excericse, does stat bike and plans to start walking more when cleared from recent surgury, can drive now.  Pain is still "excrutiating" to right chest usbjectively.  Has had rash since feb, no help with steroid cr, then saw derm with skin bx inconclusive, now scheduled for derm at [facility] in Jan 2018.  Since still having symptoms of itching, burning stinging. Did have a course of oral prednisone per [place] MD and seemed to help temporarily.    Past Medical History:  Diagnosis Date  . 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 ALLERGIC RHINITIS 01/24/2008  . ANEMIA-IRON DEFICIENCY 01/24/2008   pt denied  . ANXIETY 02/26/2009  . BACK PAIN 09/30/2010  . Breast cancer ([place])   . Cancer of central portion of female breast ([facility]) 05/04/2015  . DIZZINESS 02/28/2010  . ELEVATED BLOOD PRESSURE WITHOUT DIAGNOSIS OF HYPERTENSION 02/26/2009   patient denies  . FATIGUE 02/26/2009  . GERD 01/24/2008  . History  of blood transfusion ectopic pregnancy  . HYPERLIPIDEMIA 08/16/2008  . SHINGLES 11/06/2010  . TOE PAIN 09/30/2010  . VARICOSE VEINS, LOWER EXTREMITIES 08/16/2008  . 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 BACK SURGERY    . BREAST LUMPECTOMY Right 05/14/2015   Procedure: RIGHT BREAST LUMPECTOMY;  Surgeon: Alphonsa Overall, MD;  Location: [facility];  Bell: General;  Laterality: Right;  . BREAST RECONSTRUCTION WITH PLACEMENT OF TISSUE EXPANDER AND FLEX HD (ACELLULAR HYDRATED DERMIS) Right 10/09/2016   Procedure: IMMEDIATE RIGHT BREAST RECONSTRUCTION WITH PLACEMENT OF SILICONE GEL IMPLANTS OR TISSUE EXPANDER AND ACELLULAR DERMAL MATRIX;  Surgeon: Crissie Reese, MD;  Location: [facility];  Bell: Plastics;  Laterality: Right;  . ECTOPIC PREGNANCY SURGERY     at 75 yo with salpingectomy  . FOOT SURGERY Right    bunion, hammer toe.  x 2  . FOOT SURGERY Right 05/2016  . INCISION AND DRAINAGE OF WOUND Right 10/09/2016   Procedure: IRRIGATION AND DEBRIDEMENT BREAST HEMATOMA;  Surgeon: Crissie Reese, MD;  Location: [facility];  Bell: Plastics;  Laterality: Right;  . MASTECTOMY W/ SENTINEL NODE BIOPSY Right 10/09/2016  . MASTECTOMY W/ SENTINEL NODE BIOPSY Right 10/09/2016   Procedure: RIGHT MASTECTOMY WITH SENTINEL LYMPH NODE BIOPSY;  Surgeon: Alphonsa Overall, MD;  Location: [facility];  Bell: General;  Laterality: Right;  . s/p bilateral breasts implants  12/2007  . s/p lumbar surgury  july 2010  . TONSILLECTOMY      reports that  she has never smoked. She has never used smokeless tobacco. She reports that she drinks about 3.6 oz of alcohol per week . She reports that she does not use drugs. family history includes Cancer in her father. Allergies  Allergen Reactions  . No Known Allergies    Current Outpatient Prescriptions on File Prior to Visit  Medication Sig Dispense Refill  . clobetasol ointment (TEMOVATE) [id number] % Apply 1 application topically 2 (two) times daily  as needed (for rash).     . clonazePAM (KLONOPIN) 1 MG tablet Take 1 tablet (1 mg total) by mouth 2 (two) times daily as needed for anxiety. 60 tablet 5  . tretinoin (RETIN-A) 0.025 % cream Apply topically at bedtime. 45 g 0   No current facility-administered medications on file prior to visit.    Review of Systems Constitutional: Negative for increased diaphoresis, or other activity, appetite or siginficant weight change other than noted HENT: Negative for worsening hearing loss, ear pain, facial swelling, mouth sores and neck stiffness.   Eyes: Negative for other worsening pain, redness or visual disturbance.  Respiratory: Negative for choking or stridor Cardiovascular: Negative for other chest pain and palpitations.  Gastrointestinal: Negative for worsening diarrhea, blood in stool, or abdominal distention Genitourinary: Negative for hematuria, flank pain or change in urine volume.  Musculoskeletal: Negative for myalgias or other joint complaints.  Skin: Negative for other color change and wound or drainage.  Neurological: Negative for syncope and numbness. other than noted Hematological: Negative for adenopathy. or other swelling Psychiatric/Behavioral: Negative for hallucinations, SI, self-injury, decreased concentration or other worsening agitation.  All other system neg per pt    Objective:   Physical Exam . Vs VS noted,  Constitutional: Pt is oriented to person, place, and time. Appears well-developed and well-nourished, in no significant distress Head: Normocephalic and atraumatic  Eyes: Conjunctivae and EOM are normal. Pupils are equal, round, and reactive to light Right Ear: External ear normal.  Left Ear: External ear normal Nose: Nose normal.  Mouth/Throat: Oropharynx is clear and moist  Neck: Normal range of motion. Neck supple. No JVD present. No tracheal deviation present or significant neck LA or mass Cardiovascular: Normal rate, regular rhythm, normal heart sounds  and intact distal pulses.   Pulmonary/Chest: Effort normal and breath sounds without rales or wheezing  Abdominal: Soft. Bowel sounds are normal. NT. No HSM  Musculoskeletal: Normal range of motion. Exhibits no edema Lymphadenopathy: Has no cervical adenopathy.  Neurological: Pt is alert and oriented to person, place, and time. Pt has normal reflexes. No cranial nerve deficit. Motor grossly intact Skin: Skin is warm and dry. No rash noted or new ulcers Psychiatric:  Has normal mood and affect. Behavior is normal.  No other new exam findings.    Assessment & Plan:

## 2016-12-02 NOTE — Patient Instructions (Addendum)
You had the flu shot today  Please continue all other medications as before, and refills have been done if requested.  Please have the pharmacy call with any other refills you may need.  Please continue your efforts at being more active, low cholesterol diet, and weight control.  You are otherwise up to date with prevention measures today.  Please keep your appointments with your specialists as you may have planned  If you have Medicare related insurance (such as traditional Medicare, [employer] or [employer], or similar), Please make an appointment at the Scheduling desk with Maudie Mercury, the [employer], for your Wellness Visit in this office, which is a benefit with your insurance.  Please remember to sign up for MyChart if you have not done so, as this will be important to you in the future with finding out test results, communicating by private email, and scheduling acute appointments online when needed.  Please return in 6 months, or sooner if needed

## 2016-12-02 NOTE — Progress Notes (Signed)
Pre visit review using our clinic review tool, if applicable. No additional management support is needed unless otherwise documented below in the visit note. 

## 2016-12-09 NOTE — Assessment & Plan Note (Signed)
stable overall by history and exam, recent data reviewed with pt, and pt to continue medical treatment as before,  to f/u any worsening symptoms or concerns Lab Results  Component Value Date   LDLCALC 106 (H) 04/16/2016

## 2016-12-09 NOTE — Assessment & Plan Note (Signed)
stable overall by history and exam, recent data reviewed with pt, and pt to continue medical treatment as before,  to f/u any worsening symptoms or concerns Lab Results  Component Value Date   HGBA1C 5.7 04/16/2016    

## 2016-12-09 NOTE — Assessment & Plan Note (Signed)
No rash today, but I question possible vasculitis type rash, pt to f/u with tertiary ctr dermatology

## 2016-12-12 ENCOUNTER — Ambulatory Visit (INDEPENDENT_AMBULATORY_CARE_PROVIDER_SITE_OTHER): Payer: Medicare Other | Admitting: Neurology

## 2016-12-12 DIAGNOSIS — G243 Spasmodic torticollis: Secondary | ICD-10-CM | POA: Diagnosis not present

## 2016-12-12 MED ORDER — ONABOTULINUMTOXINA 100 UNITS IJ SOLR
290.0000 [IU] | Freq: Once | INTRAMUSCULAR | Status: AC
  Administered 2016-12-12: 290 [IU] via INTRAMUSCULAR

## 2016-12-12 NOTE — Procedures (Signed)
[facility]   Procedure Note Botox  Attending: Dr. Wells Guiles Keeya Dyckman  Preoperative Diagnosis(es): Cervical Dystonia  Result History  Onset of effect: 2 days Duration of Benefit: still working but has head tilt Adverse Effects: no dysphagia last 3 times  Physical Exam: significant tilt to the L   Consent obtained from: The patient  Benefits discussed included, but were not limited to decreased muscle tightness, increased joint range of motion, and decreased pain.  Risk discussed included, but were not limited pain and discomfort, bleeding, bruising, excessive weakness, venous thrombosis, muscle atrophy and dysphagia.  A copy of the patient medication guide was given to the patient which explains the blackbox warning.  Informed consent was obtained  Patients identity and treatment sites confirmed yes.     Details of Procedure: Skin was cleaned with alcohol.  A 30 gauge, 1/2 inch needle was introduced to the target muscle (except splenius capitus, posterior approach, where 27 inch, 1 1/2 gauge needle was used).  Prior to injection, the needle plunger was aspirated to make sure the needle was not within a blood vessel.  There was no blood retrieved on aspiration.    Following is a summary of the muscles injected  And the amount of Botulinum toxin used:   Dilution 0.9% preservative free saline mixed with 100 u Botox type A to make 10 U per 0.1cc  Injections  Location Left  Right Units Number of sites        Sternocleidomastoid 60+40 0 100 1  Splenius Capitus, posterior approach 0 70 70 1  Splenius Capitus, lateral approach 0 60 60 1  Levator Scapulae 30  30 0  Trapezius 10/20  30         TOTAL UNITS:   290    Agent: Botulinum Type A ( Onobotulinum Toxin type A ). 3 vials of Botox were used, containing 100 units and freshly diluted with 1 mL of sterile, non-preserved saline    Total injected (Units): 290  Total wasted (Units): 0 units   Pt tolerated procedure well without  complications.   Reinjection is anticipated in 3 months.

## 2017-01-02 ENCOUNTER — Telehealth: Payer: Self-pay | Admitting: Neurology

## 2017-01-02 NOTE — Telephone Encounter (Signed)
From Dr. Carles Collet "Call patient in 3 weeks and see how doing. I think that I need to increase botox dose to L levator, L trap, and increase back right splenius dose but need to prior auth more so checking with her"  Spoke with patient and she states she is doing much better with this last Botox injection than the two previous. Less turning of her head. She will contact us if anything changes.  Dr. Carles Collet - do I need to get authorization for increase in dose?

## 2017-01-02 NOTE — Telephone Encounter (Signed)
yes

## 2017-01-05 ENCOUNTER — Other Ambulatory Visit: Payer: Self-pay | Admitting: Neurology

## 2017-01-21 DIAGNOSIS — L308 Other specified dermatitis: Secondary | ICD-10-CM | POA: Diagnosis not present

## 2017-01-26 ENCOUNTER — Telehealth: Payer: Self-pay | Admitting: Hematology and Oncology

## 2017-01-26 DIAGNOSIS — L309 Dermatitis, unspecified: Secondary | ICD-10-CM | POA: Diagnosis not present

## 2017-01-26 DIAGNOSIS — L235 Allergic contact dermatitis due to other chemical products: Secondary | ICD-10-CM | POA: Diagnosis not present

## 2017-01-26 NOTE — Telephone Encounter (Signed)
left voicemail to advise that appointment has been changed from 02/25/17 to 02/24/17 at 11:45am

## 2017-02-23 DIAGNOSIS — Z9882 Breast implant status: Secondary | ICD-10-CM | POA: Diagnosis not present

## 2017-02-23 DIAGNOSIS — Z853 Personal history of malignant neoplasm of breast: Secondary | ICD-10-CM | POA: Diagnosis not present

## 2017-02-24 ENCOUNTER — Encounter: Payer: Self-pay | Admitting: Hematology and Oncology

## 2017-02-24 ENCOUNTER — Ambulatory Visit (HOSPITAL_BASED_OUTPATIENT_CLINIC_OR_DEPARTMENT_OTHER): Payer: Medicare Other | Admitting: Hematology and Oncology

## 2017-02-24 DIAGNOSIS — Z853 Personal history of malignant neoplasm of breast: Secondary | ICD-10-CM | POA: Diagnosis not present

## 2017-02-24 DIAGNOSIS — C50111 Malignant neoplasm of central portion of right female breast: Secondary | ICD-10-CM

## 2017-02-24 DIAGNOSIS — Z170 Estrogen receptor positive status [ER+]: Secondary | ICD-10-CM

## 2017-02-24 NOTE — Progress Notes (Signed)
Patient Care Team: Biagio Borg, MD as PCP - General Alphonsa Overall, MD as Consulting Physician (General Surgery) Nicholas Lose, MD as Consulting Physician (Hematology and Oncology) Thea Silversmith, MD (Inactive) as Consulting Physician (Radiation Oncology) Mauro Kaufmann, RN as Registered Nurse Rockwell Germany, RN as Registered Nurse Holley Bouche, NP as Nurse Practitioner (Nurse Practitioner) Sylvan Cheese, NP as Nurse Practitioner (Nurse Practitioner)  DIAGNOSIS:  Encounter Diagnosis  Name Primary?  . Malignant neoplasm of central portion of right breast in female, estrogen receptor positive ([place])     SUMMARY OF ONCOLOGIC HISTORY:   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 ([place])   04/19/2015 Mammogram    Right breast: calcifications warrant further evaluation with magnified views.      04/30/2015 Breast US    Right breast: 2.2 cm group of slightly suspicious calcifications located within the superior subareolar portion of the breast, bx recommended      05/01/2015 Initial Biopsy    Right breast needle core bx: DCIS, grade 1, ER+ (90%), PR+ (90%)      05/08/2015 Breast MRI    Right breast postbiopsy changes within the subareolar portion extends to involve the nipple together with the enhancement and biopsy change 3 x 2.2 x 2 cm size; worrisome calcifications measure 2.2 cm      05/08/2015 Clinical Stage    Stage 0: Tis N0      05/14/2015 Definitive Surgery    Right lumpectomy Lucia Gaskins): Low grade DCIS, 3.6 cm, margins negative, ER+ (90%), PR+ (90%)      05/14/2015 Pathologic Stage    Stage 0: Tis N0       Anti-estrogen oral therapy    Patient refused tamoxifen therapy (she met with radiation oncology in [place] and was told that she does not need radiation)       09/20/2015 Survivorship    Survivorship care plan completed and mailed to patient in lieu of in-person visit at her request.      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 There was no additional amount of DCIS forming the mastectomy specimen. She has recovered from all of that and is feeling quite well. She underwent breast reconstruction.  REVIEW OF SYSTEMS:   Constitutional: Denies fevers, chills or abnormal weight loss Eyes: Denies blurriness of vision Ears, nose, mouth, throat, and face: Denies mucositis or sore throat Respiratory: Denies cough, dyspnea or wheezes Cardiovascular: Denies palpitation, chest discomfort Gastrointestinal:  Denies nausea, heartburn or change in bowel habits Skin: Denies abnormal skin rashes Lymphatics: Denies new lymphadenopathy or easy bruising Neurological:Denies numbness, tingling or new weaknesses Behavioral/Psych: Mood is stable, no new changes  Extremities: No lower extremity edema Breast:  Right mastectomy with reconstruction All other systems were reviewed with the patient and are negative.  I have reviewed the past medical history, past surgical history, social history and family history with the patient and they are unchanged from previous note.  ALLERGIES:  is allergic to no known allergies.  MEDICATIONS:  Current Outpatient Prescriptions  Medication Sig Dispense Refill  . acetaminophen (TYLENOL) 325 MG tablet Take 650 mg by mouth every 6 (six) hours as needed.    . clobetasol ointment (TEMOVATE) 1.50 % Apply 1 application topically 2 (two) times daily as needed (for rash).      . clonazePAM (KLONOPIN) 1 MG tablet TAKE ONE TABLET BY MOUTH TWICE DAILY AS NEEDED 60 tablet 5  . hydrochlorothiazide (MICROZIDE) 12.5 MG capsule Take 12.5 mg by mouth as needed.    . tretinoin (RETIN-A) 0.025 % cream Apply topically at bedtime. 45 g 0   No current facility-administered medications for this visit.     PHYSICAL EXAMINATION: ECOG PERFORMANCE STATUS: 1 - Symptomatic but completely ambulatory  Vitals:   02/24/17 1152  BP: (!) 142/69  Pulse: 68  Resp: 18  Temp: 97.6 F (36.4 C)   Filed Weights   02/24/17 1152  Weight: 168 lb 3.2 oz (76.3 kg)    GENERAL:alert, no distress and comfortable SKIN: skin color, texture, turgor are normal, no rashes or significant lesions EYES: normal, Conjunctiva are pink and non-injected, sclera clear OROPHARYNX:no exudate, no erythema and lips, buccal mucosa, and tongue normal  NECK: supple, thyroid normal size, non-tender, without nodularity LYMPH:  no palpable lymphadenopathy in the cervical, axillary or inguinal LUNGS: clear to auscultation and percussion with normal breathing effort HEART: regular rate & rhythm and no murmurs and no lower extremity edema ABDOMEN:abdomen soft, non-tender and normal bowel sounds MUSCULOSKELETAL:no cyanosis of digits and no clubbing  NEURO: alert & oriented x 3 with fluent speech, no focal motor/sensory deficits EXTREMITIES: No lower extremity edema  LABORATORY DATA:  I have reviewed the data as listed   Chemistry      Component Value Date/Time   NA 139 10/01/2016 1035   NA 141 05/09/2015 0817   K 4.3 10/01/2016 1035   K 4.3 05/09/2015 0817   CL 107 10/01/2016 1035   CO2 27 10/01/2016 1035   CO2 25 05/09/2015 0817   BUN 9 10/01/2016 1035   BUN 11.3 05/09/2015 0817   CREATININE 0.76 10/01/2016 1035   CREATININE 0.8 05/09/2015 0817      Component Value Date/Time   CALCIUM 8.9 10/01/2016 1035   CALCIUM 9.0 05/09/2015 0817   ALKPHOS 63 04/16/2016 1136   ALKPHOS 60 05/09/2015 0817    AST 14 04/16/2016 1136   AST 19 05/09/2015 0817   ALT 14 04/16/2016 1136   ALT 21 05/09/2015 0817   BILITOT 0.5 04/16/2016 1136   BILITOT 0.51 05/09/2015 0817       Lab Results  Component Value Date   WBC 7.0 10/01/2016   HGB 13.2 10/01/2016   HCT 40.6 10/01/2016   MCV 94.9 10/01/2016   PLT 312 10/01/2016   NEUTROABS 7.0 04/16/2016    ASSESSMENT & PLAN:  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 Rt Lumpectomy 05/14/15 : 3.6 cm Low grade DCIS Margins Neg, Er 90%, PR 90% Did not require radiation Patient refused adjuvant tamoxifen therapy  Recurrence 06/20/2016:DCIS with calcifications ER PR positive Right mastectomy 10/09/2016: 0/2 lymph nodes, no residual DCIS  Current treatment plan: Surveillance  Breast Cancer Surveillance: 1. Breast exam 02/24/2017: Normal 2. Mammogram will need to be done annually on the left breast in July 2018  Cellulitis involving the toe, resolved Patient wishes to follow up with her surgeon. I will see her on an as-needed basis.   I spent 25  minutes talking to the patient of which more than half was spent in counseling and coordination of care.  No orders of the defined types were placed in this encounter.  The patient has a good understanding of the overall plan. she agrees with it. she will call with any problems that may develop before the next visit here.   Rulon Eisenmenger, MD 02/24/17

## 2017-02-24 NOTE — Assessment & Plan Note (Signed)
Rt Lumpectomy 05/14/15 : 3.6 cm Low grade DCIS Margins Neg, Er 90%, PR 90% Did not require radiation Patient refused adjuvant tamoxifen therapy  Recurrence 06/20/2016:DCIS with calcifications ER PR positive Right mastectomy 10/09/2016: 0/2 lymph nodes, no residual DCIS  Current treatment plan: Surveillance  Breast Cancer Surveillance: 1. Breast exam 02/24/2017: Normal 2. Mammogram will need to be done annually on the left breast in July 2008  Cellulitis involving the toe, nasal area required 4 sets of antibiotics and finally it cleared. Because of this, she canceled her trip to [place]. She plans to go and meet her family next year instead.  Return to clinic in 1 yr for follow-up with survivorship clinic.

## 2017-02-25 ENCOUNTER — Ambulatory Visit: Payer: Medicare Other | Admitting: Hematology and Oncology

## 2017-03-20 ENCOUNTER — Ambulatory Visit (INDEPENDENT_AMBULATORY_CARE_PROVIDER_SITE_OTHER): Payer: Medicare Other | Admitting: Neurology

## 2017-03-20 DIAGNOSIS — G243 Spasmodic torticollis: Secondary | ICD-10-CM | POA: Diagnosis not present

## 2017-03-20 MED ORDER — ONABOTULINUMTOXINA 100 UNITS IJ SOLR
350.0000 [IU] | Freq: Once | INTRAMUSCULAR | Status: AC
  Administered 2017-03-20: 350 [IU] via INTRAMUSCULAR

## 2017-03-20 NOTE — Procedures (Signed)
[facility]   Procedure Note Botox  Attending: Dr. Wells Guiles Wyley Hack  Preoperative Diagnosis(es): Cervical Dystonia  Result History  Onset of effect: 2 days Duration of Benefit: still working but noting more pull  Physical Exam: significant tilt to the L and head turn to the right   Consent obtained from: The patient  Benefits discussed included, but were not limited to decreased muscle tightness, increased joint range of motion, and decreased pain.  Risk discussed included, but were not limited pain and discomfort, bleeding, bruising, excessive weakness, venous thrombosis, muscle atrophy and dysphagia.  A copy of the patient medication guide was given to the patient which explains the blackbox warning.  Informed consent was obtained  Patients identity and treatment sites confirmed yes.     Details of Procedure: Skin was cleaned with alcohol.  A 30 gauge, 1/2 inch needle was introduced to the target muscle (except splenius capitus, posterior approach, where 27 inch, 1 1/2 gauge needle was used).  Prior to injection, the needle plunger was aspirated to make sure the needle was not within a blood vessel.  There was no blood retrieved on aspiration.    Following is a summary of the muscles injected  And the amount of Botulinum toxin used:   Dilution 0.9% preservative free saline mixed with 100 u Botox type A to make 10 U per 0.1cc  Injections  Location Left  Right Units Number of sites        Sternocleidomastoid 60+40 0 100 1  Splenius Capitus, posterior approach 0 80 80 1  Splenius Capitus, lateral approach 0 60 60 1  Levator Scapulae 20/20  40 0  Trapezius 10/18/19 10/10 70         TOTAL UNITS:   350    Agent: Botulinum Type A ( Onobotulinum Toxin type A ). 3 vials of Botox were used, containing 100 units and freshly diluted with 1 mL of sterile, non-preserved saline    Total injected (Units): 350  Total wasted (Units): 0 units   Pt tolerated procedure well without  complications.   Reinjection is anticipated in 3 months.

## 2017-03-21 DIAGNOSIS — M5441 Lumbago with sciatica, right side: Secondary | ICD-10-CM | POA: Diagnosis not present

## 2017-03-23 DIAGNOSIS — L235 Allergic contact dermatitis due to other chemical products: Secondary | ICD-10-CM | POA: Diagnosis not present

## 2017-03-23 DIAGNOSIS — L298 Other pruritus: Secondary | ICD-10-CM | POA: Diagnosis not present

## 2017-04-02 ENCOUNTER — Telehealth: Payer: Self-pay | Admitting: Neurology

## 2017-04-02 NOTE — Telephone Encounter (Signed)
We can get her the refill early. As for the trouble swallowing:  As she knows, it will get better (usually in a couple of weeks after the Botox but sometimes may take a little more time.  In the meantime, she should take small bites and chew carefully before trying to swallow.  If needed, she should drink water to help was the food down.

## 2017-04-02 NOTE — Telephone Encounter (Signed)
Spoke with patient.  She states since 4-5 days after Botox injection she has had trouble swallowing (this has happened in the past).  She is able to keep down liquids, but can not swallow food well. She states the only thing helping is taking her clonazepam 1 mg tablets. She has been taking an extra at lunch time as needed (she usually takes in the morning and night) but will run out before she can get a refill (in two days). She is asking if we can override her monthly refill to get early and if any other suggestions to help with swallowing.  Please advise. She is aware Dr. Carles Collet not available.

## 2017-04-02 NOTE — Telephone Encounter (Signed)
Called pharmacy. They already approved meds and she picked them up.  Spoke with patient. Made her aware of recommendations and she will call if it worsens.

## 2017-04-28 DIAGNOSIS — L239 Allergic contact dermatitis, unspecified cause: Secondary | ICD-10-CM | POA: Diagnosis not present

## 2017-04-28 DIAGNOSIS — L570 Actinic keratosis: Secondary | ICD-10-CM | POA: Diagnosis not present

## 2017-05-21 ENCOUNTER — Encounter: Payer: Self-pay | Admitting: Internal Medicine

## 2017-05-21 ENCOUNTER — Ambulatory Visit (INDEPENDENT_AMBULATORY_CARE_PROVIDER_SITE_OTHER): Payer: Medicare Other | Admitting: Internal Medicine

## 2017-05-21 VITALS — BP 140/86 | HR 70 | Ht 64.0 in | Wt 165.0 lb

## 2017-05-21 DIAGNOSIS — R21 Rash and other nonspecific skin eruption: Secondary | ICD-10-CM | POA: Diagnosis not present

## 2017-05-21 DIAGNOSIS — R7302 Impaired glucose tolerance (oral): Secondary | ICD-10-CM | POA: Diagnosis not present

## 2017-05-21 DIAGNOSIS — R5383 Other fatigue: Secondary | ICD-10-CM

## 2017-05-21 DIAGNOSIS — E785 Hyperlipidemia, unspecified: Secondary | ICD-10-CM | POA: Diagnosis not present

## 2017-05-21 NOTE — Assessment & Plan Note (Signed)
Etiology unclear except I suspect that she has in fact depression and anxiety but lacks insight or is in denial, for lab eval as ordered, but I would avoid shotgun approach to labs such as disease or heavy metal levels

## 2017-05-21 NOTE — Assessment & Plan Note (Signed)
stable overall by history and exam, recent data reviewed with pt, and pt to continue medical treatment as before,  to f/u any worsening symptoms or concerns Lab Results  Component Value Date   HGBA1C 5.7 04/16/2016

## 2017-05-21 NOTE — Progress Notes (Addendum)
Subjective:    Patient ID: [name] Bell, female    DOB: 01/18/1941, 76 y.o.   MRN: [id number]  HPI  Here with numerous vague symptoms of right forehead HA, fatigue, recurring cold fingers and rashes recurring to fingers and mid upper chest that have not responded to multiple specialist evaluations including derm and [facility], has tried triam cr, tacrolimus, and clobetasol prn.  Spent last 3 days in bed.  Always cold for years.  Even wondering if somehow she has gotten something in her body that has not been detected, and husband asks her here for "extensive blood work." but o/w cannot be more specific.  Denies worsening depressive symptoms, suicidal ideation, or panic; has ongoing anxiety. Past Medical History:  Diagnosis Date  . 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 ALLERGIC RHINITIS 01/24/2008  . ANEMIA-IRON DEFICIENCY 01/24/2008   pt denied  . ANXIETY 02/26/2009  . BACK PAIN 09/30/2010  . Breast cancer ([place])   . Cancer of central portion of female breast ([place]) 05/04/2015  . DIZZINESS 02/28/2010  . ELEVATED BLOOD PRESSURE WITHOUT DIAGNOSIS OF HYPERTENSION 02/26/2009   patient denies  . FATIGUE 02/26/2009  . GERD 01/24/2008  . History of blood transfusion ectopic pregnancy  . HYPERLIPIDEMIA 08/16/2008  . SHINGLES 11/06/2010  . TOE PAIN 09/30/2010  . VARICOSE VEINS, LOWER EXTREMITIES 08/16/2008  . 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 BACK SURGERY    . BREAST LUMPECTOMY Right 05/14/2015   Procedure: RIGHT BREAST LUMPECTOMY;  Surgeon: Alphonsa Overall, MD;  Location: [facility];  Bell: General;  Laterality: Right;  . BREAST RECONSTRUCTION WITH PLACEMENT OF TISSUE EXPANDER AND FLEX HD (ACELLULAR HYDRATED DERMIS) Right 10/09/2016   Procedure: IMMEDIATE RIGHT BREAST RECONSTRUCTION WITH PLACEMENT OF SILICONE GEL IMPLANTS OR TISSUE EXPANDER AND ACELLULAR DERMAL MATRIX;  Surgeon: Crissie Reese, MD;  Location: [facility];  Bell: Plastics;  Laterality:  Right;  . ECTOPIC PREGNANCY SURGERY     at 76 yo with salpingectomy  . FOOT SURGERY Right    bunion, hammer toe.  x 2  . FOOT SURGERY Right 05/2016  . INCISION AND DRAINAGE OF WOUND Right 10/09/2016   Procedure: IRRIGATION AND DEBRIDEMENT BREAST HEMATOMA;  Surgeon: Crissie Reese, MD;  Location: [facility];  Bell: Plastics;  Laterality: Right;  . MASTECTOMY W/ SENTINEL NODE BIOPSY Right 10/09/2016  . MASTECTOMY W/ SENTINEL NODE BIOPSY Right 10/09/2016   Procedure: RIGHT MASTECTOMY WITH SENTINEL LYMPH NODE BIOPSY;  Surgeon: Alphonsa Overall, MD;  Location: [facility];  Bell: General;  Laterality: Right;  . s/p bilateral breasts implants  12/2007  . s/p lumbar surgury  july 2010  . TONSILLECTOMY      reports that she has never smoked. She has never used smokeless tobacco. She reports that she drinks about 3.6 oz of alcohol per week . She reports that she does not use drugs. family history includes Cancer in her father. Allergies  Allergen Reactions  . No Known Allergies    Current Outpatient Prescriptions on File Prior to Visit  Medication Sig Dispense Refill  . acetaminophen (TYLENOL) 325 MG tablet Take 650 mg by mouth every 6 (six) hours as needed.    . clonazePAM (KLONOPIN) 1 MG tablet TAKE ONE TABLET BY MOUTH TWICE DAILY AS NEEDED 60 tablet 5  . hydrochlorothiazide (MICROZIDE) 12.5 MG capsule Take 12.5 mg by mouth as needed.    . tretinoin (RETIN-A) 0.025 % cream Apply topically at bedtime. 45 g 0  No current facility-administered medications on file prior to visit.    Review of Systems  Constitutional: Negative for other unusual diaphoresis or sweats HENT: Negative for ear discharge or swelling Eyes: Negative for other worsening visual disturbances Respiratory: Negative for stridor or other swelling  Gastrointestinal: Negative for worsening distension or other blood Genitourinary: Negative for retention or other urinary change Musculoskeletal: Negative for other MSK pain or  swelling Skin: Negative for color change or other new lesions Neurological: Negative for worsening tremors and other numbness  Psychiatric/Behavioral: Negative for worsening agitation or other fatigue All other system neg per pt    Objective:   Physical Exam  BP 140/86   Pulse 70   Ht 5\' 4"  (1.626 m)   Wt 165 lb (74.8 kg)   SpO2 96%   BMI 28.32 kg/m  VS noted,  Constitutional: Pt appears in NAD HENT: Head: NCAT.  Right Ear: External ear normal.  Left Ear: External ear normal.  Eyes: . Pupils are equal, round, and reactive to light. Conjunctivae and EOM are normal Nose: without d/c or deformity Neck: Neck supple. Gross normal ROM Cardiovascular: Normal rate and regular rhythm.   Pulmonary/Chest: Effort normal and breath sounds without rales or wheezing.  Abd:  Soft, NT, ND, + BS, no organomegaly Neurological: Pt is alert. At baseline orientation, motor grossly intact Skin: Skin is warm. No rashes, other new lesions, no LE edema Psychiatric: Pt behavior is normal without agitation  No other exam findings    Assessment & Plan:

## 2017-05-21 NOTE — Assessment & Plan Note (Signed)
stable overall by history and exam, recent data reviewed with pt, and pt to continue medical treatment as before,  to f/u any worsening symptoms or concerns, for f/u lab 

## 2017-05-21 NOTE — Assessment & Plan Note (Signed)
None today as I am unable to appreciate any significant abnormality, ok to follow

## 2017-05-21 NOTE — Patient Instructions (Addendum)
Please continue all other medications as before, and refills have been done if requested.  Please have the pharmacy call with any other refills you may need.  Please keep your appointments with your specialists as you may have planned  Please go to the LAB in the Basement (turn left off the elevator) for the tests to be done today  You will be contacted by phone if any changes need to be made immediately.  Otherwise, you will receive a letter about your results with an explanation, but please check with MyChart first.  Please remember to sign up for MyChart if you have not done so, as this will be important to you in the future with finding out test results, communicating by private email, and scheduling acute appointments online when needed.  Please return in 6 months, or sooner if needed 

## 2017-05-26 ENCOUNTER — Other Ambulatory Visit (INDEPENDENT_AMBULATORY_CARE_PROVIDER_SITE_OTHER): Payer: Medicare Other

## 2017-05-26 DIAGNOSIS — E785 Hyperlipidemia, unspecified: Secondary | ICD-10-CM

## 2017-05-26 DIAGNOSIS — R7302 Impaired glucose tolerance (oral): Secondary | ICD-10-CM

## 2017-05-26 DIAGNOSIS — R5383 Other fatigue: Secondary | ICD-10-CM | POA: Diagnosis not present

## 2017-05-26 LAB — BASIC METABOLIC PANEL
BUN: 22 mg/dL (ref 6–23)
CHLORIDE: 103 meq/L (ref 96–112)
CO2: 29 mEq/L (ref 19–32)
Calcium: 9.5 mg/dL (ref 8.4–10.5)
Creatinine, Ser: 0.87 mg/dL (ref 0.40–1.20)
GFR: 67.65 mL/min (ref >60.00)
GLUCOSE: 89 mg/dL (ref 70–99)
POTASSIUM: 4.3 meq/L (ref 3.5–5.1)
Sodium: 137 mEq/L (ref 135–145)

## 2017-05-26 LAB — HEPATIC FUNCTION PANEL
ALBUMIN: 4.2 g/dL (ref 3.5–5.2)
ALT: 14 U/L (ref 0–35)
AST: 16 U/L (ref 0–37)
Alkaline Phosphatase: 47 U/L (ref 39–117)
Bilirubin, Direct: 0.1 mg/dL (ref 0.0–0.3)
Total Bilirubin: 0.3 mg/dL (ref 0.2–1.2)
Total Protein: 7.1 g/dL (ref 6.0–8.3)

## 2017-05-26 LAB — URINALYSIS, ROUTINE W REFLEX MICROSCOPIC
Bilirubin Urine: NEGATIVE
Hgb urine dipstick: NEGATIVE
KETONES UR: NEGATIVE
LEUKOCYTES UA: NEGATIVE
NITRITE: NEGATIVE
RBC / HPF: NONE SEEN (ref 0–?)
TOTAL PROTEIN, URINE-UPE24: NEGATIVE
Urine Glucose: NEGATIVE
Urobilinogen, UA: 0.2 (ref 0.0–1.0)
pH: 6 (ref 5.0–8.0)

## 2017-05-26 LAB — CBC WITH DIFFERENTIAL/PLATELET
Basophils Absolute: 0.1 10*3/uL (ref 0.0–0.1)
Basophils Relative: 0.9 % (ref 0.0–3.0)
EOS ABS: 0.1 10*3/uL (ref 0.0–0.7)
Eosinophils Relative: 1.7 % (ref 0.0–5.0)
HCT: 39.9 % (ref 36.0–46.0)
HEMOGLOBIN: 13.6 g/dL (ref 12.0–15.0)
Lymphocytes Relative: 26.1 % (ref 12.0–46.0)
Lymphs Abs: 1.8 10*3/uL (ref 0.7–4.0)
MCHC: 34.1 g/dL (ref 30.0–36.0)
MCV: 93 fl (ref 78.0–100.0)
MONO ABS: 0.9 10*3/uL (ref 0.1–1.0)
Monocytes Relative: 12.7 % — ABNORMAL HIGH (ref 3.0–12.0)
NEUTROS PCT: 58.6 % (ref 43.0–77.0)
Neutro Abs: 4 10*3/uL (ref 1.4–7.7)
Platelets: 311 10*3/uL (ref 150.0–400.0)
RBC: 4.29 Mil/uL (ref 3.87–5.11)
RDW: 13 % (ref 11.5–15.5)
WBC: 6.9 10*3/uL (ref 4.0–10.5)

## 2017-05-26 LAB — LIPID PANEL
Cholesterol: 196 mg/dL (ref 0–200)
HDL: 65.6 mg/dL (ref >39.00)
LDL CALC: 116 mg/dL — AB (ref 0–99)
NonHDL: 130.66
TRIGLYCERIDES: 73 mg/dL (ref 0.0–149.0)
Total CHOL/HDL Ratio: 3
VLDL: 14.6 mg/dL (ref 0.0–40.0)

## 2017-05-26 LAB — TSH: TSH: 1.34 u[IU]/mL (ref 0.35–4.50)

## 2017-05-26 LAB — HEMOGLOBIN A1C: Hgb A1c MFr Bld: 5.4 % (ref 4.6–6.5)

## 2017-05-27 ENCOUNTER — Encounter: Payer: Self-pay | Admitting: Internal Medicine

## 2017-05-27 ENCOUNTER — Telehealth: Payer: Self-pay | Admitting: Internal Medicine

## 2017-05-27 NOTE — Telephone Encounter (Signed)
Pt called in and said that she would like someone to call her as soon has he blood work has been resulted

## 2017-05-28 DIAGNOSIS — S9781XA Crushing injury of right foot, initial encounter: Secondary | ICD-10-CM | POA: Diagnosis not present

## 2017-05-28 DIAGNOSIS — M79671 Pain in right foot: Secondary | ICD-10-CM | POA: Diagnosis not present

## 2017-05-28 NOTE — Telephone Encounter (Signed)
Results were ok, no abnormalities significant enough to be of concern at this time. Pt has been informed and expressed understanding.

## 2017-05-28 NOTE — Telephone Encounter (Signed)
Pt called back today regarding her lab work, she wants the results

## 2017-06-03 ENCOUNTER — Ambulatory Visit: Payer: Medicare Other | Admitting: Internal Medicine

## 2017-06-10 DIAGNOSIS — S9031XD Contusion of right foot, subsequent encounter: Secondary | ICD-10-CM | POA: Diagnosis not present

## 2017-06-10 DIAGNOSIS — D0591 Unspecified type of carcinoma in situ of right breast: Secondary | ICD-10-CM | POA: Diagnosis not present

## 2017-06-10 DIAGNOSIS — Z9882 Breast implant status: Secondary | ICD-10-CM | POA: Diagnosis not present

## 2017-06-10 DIAGNOSIS — S9781XD Crushing injury of right foot, subsequent encounter: Secondary | ICD-10-CM | POA: Diagnosis not present

## 2017-06-10 DIAGNOSIS — M79671 Pain in right foot: Secondary | ICD-10-CM | POA: Diagnosis not present

## 2017-06-10 NOTE — Procedures (Addendum)
[facility]   Procedure Note Botox  Attending: Dr. Wells Guiles Tat  Preoperative Diagnosis(es): Cervical Dystonia  Result History  Onset of effect: 2 days Duration of Benefit: still working but noting more pull  Physical Exam: significant tilt to the L and head turn to the right   Consent obtained from: The patient  Benefits discussed included, but were not limited to decreased muscle tightness, increased joint range of motion, and decreased pain.  Risk discussed included, but were not limited pain and discomfort, bleeding, bruising, excessive weakness, venous thrombosis, muscle atrophy and dysphagia.  A copy of the patient medication guide was given to the patient which explains the blackbox warning.  Informed consent was obtained  Patients identity and treatment sites confirmed yes.     Details of Procedure: Skin was cleaned with alcohol.  A 30 gauge, 1/2 inch needle was introduced to the target muscle (except splenius capitus, posterior approach, where 27 inch, 1 1/2 gauge needle was used).  Prior to injection, the needle plunger was aspirated to make sure the needle was not within a blood vessel.  There was no blood retrieved on aspiration.    Following is a summary of the muscles injected  And the amount of Botulinum toxin used:   Dilution 0.9% preservative free saline mixed with 100 u Botox type A to make 10 U per 0.1cc  Injections  Location Left  Right Units Number of sites        Sternocleidomastoid 60+40 0 100 1  Splenius Capitus, posterior approach 0 80 80 1  Splenius Capitus, lateral approach 0 60 60 1  Levator Scapulae 30  30 0  Trapezius 10/18/19 10/10 70         TOTAL UNITS:   340    Agent: Botulinum Type A ( Onobotulinum Toxin type A ). 3 vials of Botox were used, containing 100 units and freshly diluted with 1 mL of sterile, non-preserved saline    Total injected (Units): 340  Total wasted (Units): 0 units   Pt tolerated procedure well without  complications.   Reinjection is anticipated in 3 months.

## 2017-06-11 ENCOUNTER — Ambulatory Visit (INDEPENDENT_AMBULATORY_CARE_PROVIDER_SITE_OTHER): Payer: Medicare Other | Admitting: Neurology

## 2017-06-11 DIAGNOSIS — G243 Spasmodic torticollis: Secondary | ICD-10-CM

## 2017-06-11 MED ORDER — ONABOTULINUMTOXINA 100 UNITS IJ SOLR
340.0000 [IU] | Freq: Once | INTRAMUSCULAR | Status: AC
  Administered 2017-06-11: 340 [IU] via INTRAMUSCULAR

## 2017-06-11 NOTE — Procedures (Signed)
[facility]   Procedure Note Botox  Attending: Dr. Wells Guiles Ryker Pherigo  Preoperative Diagnosis(es): Cervical Dystonia  Result History  Onset of effect: 2 days Less head pull but more dysphagia last visit (due to increased dose to levator scapulae)  Physical Exam: significant tilt to the L and head turn to the right   Consent obtained from: The patient  Benefits discussed included, but were not limited to decreased muscle tightness, increased joint range of motion, and decreased pain.  Risk discussed included, but were not limited pain and discomfort, bleeding, bruising, excessive weakness, venous thrombosis, muscle atrophy and dysphagia.  A copy of the patient medication guide was given to the patient which explains the blackbox warning.  Informed consent was obtained  Patients identity and treatment sites confirmed yes.     Details of Procedure: Skin was cleaned with alcohol.  A 30 gauge, 1/2 inch needle was introduced to the target muscle (except splenius capitus, posterior approach, where 27 inch, 1 1/2 gauge needle was used).  Prior to injection, the needle plunger was aspirated to make sure the needle was not within a blood vessel.  There was no blood retrieved on aspiration.    Following is a summary of the muscles injected  And the amount of Botulinum toxin used:   Dilution 0.9% preservative free saline mixed with 100 u Botox type A to make 10 U per 0.1cc  Injections  Location Left  Right Units Number of sites        Sternocleidomastoid 60+40 0 100 1  Splenius Capitus, posterior approach 0 80 80 1  Splenius Capitus, lateral approach 0 60 60 1  Levator Scapulae 20/20  30 0  Trapezius 10/18/19 10/10 70         TOTAL UNITS:   340    Agent: Botulinum Type A ( Onobotulinum Toxin type A ). 4 vials of Botox were used, containing 100 units and freshly diluted with 1 mL of sterile, non-preserved saline    Total injected (Units): 340  Total wasted (Units): 10 units   Pt  tolerated procedure well without complications.   Reinjection is anticipated in 3 months.

## 2017-06-16 ENCOUNTER — Other Ambulatory Visit: Payer: Self-pay

## 2017-06-16 DIAGNOSIS — Z853 Personal history of malignant neoplasm of breast: Secondary | ICD-10-CM

## 2017-06-17 ENCOUNTER — Other Ambulatory Visit: Payer: Self-pay | Admitting: Surgery

## 2017-06-17 DIAGNOSIS — Z853 Personal history of malignant neoplasm of breast: Secondary | ICD-10-CM

## 2017-06-17 DIAGNOSIS — R269 Unspecified abnormalities of gait and mobility: Secondary | ICD-10-CM | POA: Diagnosis not present

## 2017-06-17 DIAGNOSIS — S9781XD Crushing injury of right foot, subsequent encounter: Secondary | ICD-10-CM | POA: Diagnosis not present

## 2017-06-17 DIAGNOSIS — S9031XD Contusion of right foot, subsequent encounter: Secondary | ICD-10-CM | POA: Diagnosis not present

## 2017-06-17 DIAGNOSIS — M79671 Pain in right foot: Secondary | ICD-10-CM | POA: Diagnosis not present

## 2017-06-17 DIAGNOSIS — R52 Pain, unspecified: Secondary | ICD-10-CM | POA: Diagnosis not present

## 2017-06-17 DIAGNOSIS — M898X7 Other specified disorders of bone, ankle and foot: Secondary | ICD-10-CM | POA: Diagnosis not present

## 2017-06-22 DIAGNOSIS — R269 Unspecified abnormalities of gait and mobility: Secondary | ICD-10-CM | POA: Diagnosis not present

## 2017-06-22 DIAGNOSIS — S9781XD Crushing injury of right foot, subsequent encounter: Secondary | ICD-10-CM | POA: Diagnosis not present

## 2017-06-22 DIAGNOSIS — M79671 Pain in right foot: Secondary | ICD-10-CM | POA: Diagnosis not present

## 2017-06-22 DIAGNOSIS — R52 Pain, unspecified: Secondary | ICD-10-CM | POA: Diagnosis not present

## 2017-06-22 DIAGNOSIS — S9031XD Contusion of right foot, subsequent encounter: Secondary | ICD-10-CM | POA: Diagnosis not present

## 2017-06-22 DIAGNOSIS — M898X7 Other specified disorders of bone, ankle and foot: Secondary | ICD-10-CM | POA: Diagnosis not present

## 2017-06-29 DIAGNOSIS — R52 Pain, unspecified: Secondary | ICD-10-CM | POA: Diagnosis not present

## 2017-06-29 DIAGNOSIS — S9031XD Contusion of right foot, subsequent encounter: Secondary | ICD-10-CM | POA: Diagnosis not present

## 2017-06-29 DIAGNOSIS — S9781XD Crushing injury of right foot, subsequent encounter: Secondary | ICD-10-CM | POA: Diagnosis not present

## 2017-06-29 DIAGNOSIS — M79671 Pain in right foot: Secondary | ICD-10-CM | POA: Diagnosis not present

## 2017-06-29 DIAGNOSIS — R269 Unspecified abnormalities of gait and mobility: Secondary | ICD-10-CM | POA: Diagnosis not present

## 2017-06-29 DIAGNOSIS — M898X7 Other specified disorders of bone, ankle and foot: Secondary | ICD-10-CM | POA: Diagnosis not present

## 2017-06-30 ENCOUNTER — Other Ambulatory Visit: Payer: Self-pay | Admitting: Neurology

## 2017-06-30 NOTE — Telephone Encounter (Signed)
This is the first request we have gotten.  RX called to [facility].  Patient made aware.

## 2017-06-30 NOTE — Telephone Encounter (Signed)
Patient states that the drug store has sent a message about the clonazepam and she has called and left messages for someone to call her about this she is out and needs a refill today on this She would like a call to let her know that this has been done

## 2017-07-03 ENCOUNTER — Ambulatory Visit (INDEPENDENT_AMBULATORY_CARE_PROVIDER_SITE_OTHER): Payer: Medicare Other | Admitting: Internal Medicine

## 2017-07-03 VITALS — BP 130/84 | HR 71 | Temp 97.9°F | Ht 64.0 in | Wt 167.0 lb

## 2017-07-03 DIAGNOSIS — G4700 Insomnia, unspecified: Secondary | ICD-10-CM | POA: Diagnosis not present

## 2017-07-03 DIAGNOSIS — J069 Acute upper respiratory infection, unspecified: Secondary | ICD-10-CM | POA: Diagnosis not present

## 2017-07-03 DIAGNOSIS — R7302 Impaired glucose tolerance (oral): Secondary | ICD-10-CM

## 2017-07-03 DIAGNOSIS — R42 Dizziness and giddiness: Secondary | ICD-10-CM | POA: Diagnosis not present

## 2017-07-03 MED ORDER — AZITHROMYCIN 250 MG PO TABS: ORAL_TABLET | ORAL | 1 refills | Status: DC

## 2017-07-03 MED ORDER — TRAZODONE HCL 50 MG PO TABS: 25.0000 mg | ORAL_TABLET | Freq: Every evening | ORAL | 3 refills | Status: DC | PRN

## 2017-07-03 NOTE — Progress Notes (Signed)
Pre visit review using our clinic review tool, if applicable. No additional management support is needed unless otherwise documented below in the visit note. 

## 2017-07-03 NOTE — Patient Instructions (Signed)
Please take all new medication as prescribed - the antibiotic, and the trazodone for sleep  Please continue all other medications as before, and refills have been done if requested.  Please have the pharmacy call with any other refills you may need.  Please keep your appointments with your specialists as you may have planned

## 2017-07-03 NOTE — Progress Notes (Signed)
Subjective:    Patient ID: [name] Bell, female    DOB: 01-Oct-1941, 76 y.o.   MRN: [id number]  HPI   Here with 2-3 days acute onset facial pain, pressure, headache, general weakness and malaise, and with mild ST and non prod cough, but pt denies chest pain, wheezing, increased sob or doe, orthopnea, PND, increased LE swelling, palpitations, or syncope. Leaning to left, hot all over spell, ear pain bilat, no fever No syncope or falls.  Pt denies polydipsia, polyuria,  Also with more difficulty sleeping recently, asks for aid as mind just wont stop and she is up until wee hours of the morning mnost night Past Medical History:  Diagnosis Date  . 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 ALLERGIC RHINITIS 01/24/2008  . ANEMIA-IRON DEFICIENCY 01/24/2008   pt denied  . ANXIETY 02/26/2009  . BACK PAIN 09/30/2010  . Breast cancer ([place])   . Cancer of central portion of female breast ([place]) 05/04/2015  . DIZZINESS 02/28/2010  . ELEVATED BLOOD PRESSURE WITHOUT DIAGNOSIS OF HYPERTENSION 02/26/2009   patient denies  . FATIGUE 02/26/2009  . GERD 01/24/2008  . History of blood transfusion ectopic pregnancy  . HYPERLIPIDEMIA 08/16/2008  . SHINGLES 11/06/2010  . TOE PAIN 09/30/2010  . VARICOSE VEINS, LOWER EXTREMITIES 08/16/2008  . 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 BACK SURGERY    . BREAST LUMPECTOMY Right 05/14/2015   Procedure: RIGHT BREAST LUMPECTOMY;  Surgeon: Alphonsa Overall, MD;  Location: [facility];  Bell: General;  Laterality: Right;  . BREAST RECONSTRUCTION WITH PLACEMENT OF TISSUE EXPANDER AND FLEX HD (ACELLULAR HYDRATED DERMIS) Right 10/09/2016   Procedure: IMMEDIATE RIGHT BREAST RECONSTRUCTION WITH PLACEMENT OF SILICONE GEL IMPLANTS OR TISSUE EXPANDER AND ACELLULAR DERMAL MATRIX;  Surgeon: Crissie Reese, MD;  Location: [facility];  Bell: Plastics;  Laterality: Right;  . ECTOPIC PREGNANCY SURGERY     at 76 yo with salpingectomy  . FOOT  SURGERY Right    bunion, hammer toe.  x 2  . FOOT SURGERY Right 05/2016  . INCISION AND DRAINAGE OF WOUND Right 10/09/2016   Procedure: IRRIGATION AND DEBRIDEMENT BREAST HEMATOMA;  Surgeon: Crissie Reese, MD;  Location: [facility];  Bell: Plastics;  Laterality: Right;  . MASTECTOMY W/ SENTINEL NODE BIOPSY Right 10/09/2016  . MASTECTOMY W/ SENTINEL NODE BIOPSY Right 10/09/2016   Procedure: RIGHT MASTECTOMY WITH SENTINEL LYMPH NODE BIOPSY;  Surgeon: Alphonsa Overall, MD;  Location: [facility];  Bell: General;  Laterality: Right;  . s/p bilateral breasts implants  12/2007  . s/p lumbar surgury  july 2010  . TONSILLECTOMY      reports that she has never smoked. She has never used smokeless tobacco. She reports that she drinks about 3.6 oz of alcohol per week . She reports that she does not use drugs. family history includes Cancer in her father. Allergies  Allergen Reactions  . No Known Allergies    Current Outpatient Prescriptions on File Prior to Visit  Medication Sig Dispense Refill  . acetaminophen (TYLENOL) 325 MG tablet Take 650 mg by mouth every 6 (six) hours as needed.    . clonazePAM (KLONOPIN) 1 MG tablet TAKE ONE TABLET BY MOUTH TWICE DAILY AS NEEDED 60 tablet 5  . hydrochlorothiazide (MICROZIDE) 12.5 MG capsule Take 12.5 mg by mouth as needed.    . tretinoin (RETIN-A) 0.025 % cream Apply topically at bedtime. 45 g 0   No current facility-administered medications on file prior to visit.  Review of Systems  Constitutional: Negative for other unusual diaphoresis or sweats HENT: Negative for ear discharge or swelling Eyes: Negative for other worsening visual disturbances Respiratory: Negative for stridor or other swelling  Gastrointestinal: Negative for worsening distension or other blood Genitourinary: Negative for retention or other urinary change Musculoskeletal: Negative for other MSK pain or swelling Skin: Negative for color change or other new lesions Neurological: Negative for  worsening tremors and other numbness  Psychiatric/Behavioral: Negative for worsening agitation or other fatigue All other system neg per pt    Objective:   Physical Exam BP 130/84 (BP Location: Left Arm, Patient Position: Sitting, Cuff Size: Normal)   Pulse 71   Temp 97.9 F (36.6 C) (Oral)   Ht 5\' 4"  (1.626 m)   Wt 167 lb (75.8 kg)   SpO2 98%   BMI 28.67 kg/m  VS noted,  Constitutional: Pt appears in NAD HENT: Head: NCAT.  Right Ear: External ear normal.  Left Ear: External ear normal.  Eyes: . Pupils are equal, round, and reactive to light. Conjunctivae and EOM are normal Nose: without d/c or deformity \\Bilat  tm's with mild erythema with left mild worse than right and small effusion.  Max sinus areas non tender.  Pharynx with mild erythema, no exudate Neck: Neck supple. Gross normal ROM Cardiovascular: Normal rate and regular rhythm.   Pulmonary/Chest: Effort normal and breath sounds without rales or wheezing.  Abd:  Soft, NT, ND, + BS, no organomegaly Neurological: Pt is alert. At baseline orientation, motor grossly intact, has chronic torticollis Skin: Skin is warm. No rashes, other new lesions, no LE edema Psychiatric: Pt behavior is normal without agitation  No other exam findings    Assessment & Plan:

## 2017-07-04 DIAGNOSIS — R42 Dizziness and giddiness: Secondary | ICD-10-CM | POA: Insufficient documentation

## 2017-07-04 DIAGNOSIS — G4700 Insomnia, unspecified: Secondary | ICD-10-CM | POA: Insufficient documentation

## 2017-07-04 NOTE — Assessment & Plan Note (Signed)
Mild to mod, for antibx course,  to f/u any worsening symptoms or concerns 

## 2017-07-04 NOTE — Assessment & Plan Note (Signed)
stable overall by history and exam, recent data reviewed with pt, and pt to continue medical treatment as before,  to f/u any worsening symptoms or concerns Lab Results  Component Value Date   HGBA1C 5.4 05/26/2017   

## 2017-07-04 NOTE — Assessment & Plan Note (Signed)
I suspect related to middle ear involvement, cont tx as above

## 2017-07-04 NOTE — Assessment & Plan Note (Signed)
Mild to mod, for trazodone qhs prn,  to f/u any worsening symptoms or concerns 

## 2017-07-07 ENCOUNTER — Ambulatory Visit
Admission: RE | Admit: 2017-07-07 | Discharge: 2017-07-07 | Disposition: A | Discharge status: Home / Self Care | Payer: Medicare Other | Source: Ambulatory Visit | Attending: Surgery | Admitting: Surgery

## 2017-07-07 ENCOUNTER — Other Ambulatory Visit: Payer: Self-pay | Admitting: Surgery

## 2017-07-07 ENCOUNTER — Ambulatory Visit: Payer: Medicare Other

## 2017-07-07 DIAGNOSIS — Z853 Personal history of malignant neoplasm of breast: Secondary | ICD-10-CM

## 2017-07-07 DIAGNOSIS — N644 Mastodynia: Secondary | ICD-10-CM

## 2017-07-07 DIAGNOSIS — R922 Inconclusive mammogram: Secondary | ICD-10-CM | POA: Diagnosis not present

## 2017-07-07 DIAGNOSIS — N6489 Other specified disorders of breast: Secondary | ICD-10-CM | POA: Diagnosis not present

## 2017-07-08 DIAGNOSIS — M898X7 Other specified disorders of bone, ankle and foot: Secondary | ICD-10-CM | POA: Diagnosis not present

## 2017-07-08 DIAGNOSIS — S9781XD Crushing injury of right foot, subsequent encounter: Secondary | ICD-10-CM | POA: Diagnosis not present

## 2017-07-08 DIAGNOSIS — S9031XD Contusion of right foot, subsequent encounter: Secondary | ICD-10-CM | POA: Diagnosis not present

## 2017-07-08 DIAGNOSIS — M79671 Pain in right foot: Secondary | ICD-10-CM | POA: Diagnosis not present

## 2017-07-08 DIAGNOSIS — R269 Unspecified abnormalities of gait and mobility: Secondary | ICD-10-CM | POA: Diagnosis not present

## 2017-07-08 DIAGNOSIS — R52 Pain, unspecified: Secondary | ICD-10-CM | POA: Diagnosis not present

## 2017-07-24 ENCOUNTER — Other Ambulatory Visit: Payer: Self-pay | Admitting: Internal Medicine

## 2017-07-28 ENCOUNTER — Encounter: Payer: Self-pay | Admitting: Internal Medicine

## 2017-07-28 ENCOUNTER — Ambulatory Visit (INDEPENDENT_AMBULATORY_CARE_PROVIDER_SITE_OTHER): Payer: Medicare Other | Admitting: Internal Medicine

## 2017-07-28 DIAGNOSIS — G4700 Insomnia, unspecified: Secondary | ICD-10-CM | POA: Diagnosis not present

## 2017-07-28 DIAGNOSIS — J069 Acute upper respiratory infection, unspecified: Secondary | ICD-10-CM | POA: Diagnosis not present

## 2017-07-28 DIAGNOSIS — R21 Rash and other nonspecific skin eruption: Secondary | ICD-10-CM

## 2017-07-28 DIAGNOSIS — R42 Dizziness and giddiness: Secondary | ICD-10-CM | POA: Diagnosis not present

## 2017-07-28 MED ORDER — AZITHROMYCIN 250 MG PO TABS
ORAL_TABLET | ORAL | 1 refills | Status: DC
Start: 2017-07-28 — End: 2017-09-04

## 2017-07-28 NOTE — Assessment & Plan Note (Signed)
Resolved to distal RLE with zpack, in retropect may have been erysipelas?  Cont to follow

## 2017-07-28 NOTE — Assessment & Plan Note (Signed)
Resolved for now, cont to follow

## 2017-07-28 NOTE — Patient Instructions (Signed)
Please take all new medication as prescribed - the antibiotic  Please continue all other medications as before, and refills have been done if requested.  Please have the pharmacy call with any other refills you may need.  Please keep your appointments with your specialists as you may have planned   

## 2017-07-28 NOTE — Progress Notes (Signed)
Subjective:    Patient ID: [name] Bell, female    DOB: 1941-03-29, 76 y.o.   MRN: [id number]  HPI   Here with 2-3 days acute onset fever, facial pain, pressure, headache, general weakness and malaise, and greenish d/c, with mild ST and cough, but pt denies chest pain, wheezing, increased sob or doe, orthopnea, PND, increased LE swelling, palpitations, dizziness or syncope.  Was just seen for similar and improved then worse again.  Leaving for [place] in 3 days for vacation/family and has bilat ear fullness with popping and discomfort as well.  Does mention the rash to the leg resolved with zpack incidentally, and sleep much improved with the trazodone, and tolerating well Past Medical History:  Diagnosis Date  . 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 ALLERGIC RHINITIS 01/24/2008  . ANEMIA-IRON DEFICIENCY 01/24/2008   pt denied  . ANXIETY 02/26/2009  . BACK PAIN 09/30/2010  . Breast cancer ([place])   . Cancer of central portion of female breast ([place]) 05/04/2015  . DIZZINESS 02/28/2010  . ELEVATED BLOOD PRESSURE WITHOUT DIAGNOSIS OF HYPERTENSION 02/26/2009   patient denies  . FATIGUE 02/26/2009  . GERD 01/24/2008  . History of blood transfusion ectopic pregnancy  . HYPERLIPIDEMIA 08/16/2008  . SHINGLES 11/06/2010  . TOE PAIN 09/30/2010  . VARICOSE VEINS, LOWER EXTREMITIES 08/16/2008  . 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 AUGMENTATION MAMMAPLASTY Bilateral 10/09/2016  . BACK SURGERY    . BREAST LUMPECTOMY Right 05/14/2015   Procedure: RIGHT BREAST LUMPECTOMY;  Surgeon: Alphonsa Overall, MD;  Location: [facility];  Bell: General;  Laterality: Right;  . BREAST RECONSTRUCTION WITH PLACEMENT OF TISSUE EXPANDER AND FLEX HD (ACELLULAR HYDRATED DERMIS) Right 10/09/2016   Procedure: IMMEDIATE RIGHT BREAST RECONSTRUCTION WITH PLACEMENT OF SILICONE GEL IMPLANTS OR TISSUE EXPANDER AND ACELLULAR DERMAL MATRIX;  Surgeon: Crissie Reese, MD;  Location: [facility];  Bell: Plastics;  Laterality: Right;  . ECTOPIC PREGNANCY SURGERY     at 76 yo with salpingectomy  . FOOT SURGERY Right    bunion, hammer toe.  x 2  . FOOT SURGERY Right 05/2016  . INCISION AND DRAINAGE OF WOUND Right 10/09/2016   Procedure: IRRIGATION AND DEBRIDEMENT BREAST HEMATOMA;  Surgeon: Crissie Reese, MD;  Location: [facility];  Bell: Plastics;  Laterality: Right;  . MASTECTOMY Right 10/09/2016  . MASTECTOMY W/ SENTINEL NODE BIOPSY Right 10/09/2016  . MASTECTOMY W/ SENTINEL NODE BIOPSY Right 10/09/2016   Procedure: RIGHT MASTECTOMY WITH SENTINEL LYMPH NODE BIOPSY;  Surgeon: Alphonsa Overall, MD;  Location: [facility];  Bell: General;  Laterality: Right;  . s/p bilateral breasts implants  12/2007  . s/p lumbar surgury  july 2010  . TONSILLECTOMY      reports that she has never smoked. She has never used smokeless tobacco. She reports that she drinks about 3.6 oz of alcohol per week . She reports that she does not use drugs. family history includes Cancer in her father. Allergies  Allergen Reactions  . No Known Allergies    / Current Outpatient Prescriptions on File Prior to Visit  Medication Sig Dispense Refill  . acetaminophen (TYLENOL) 325 MG tablet Take 650 mg by mouth every 6 (six) hours as needed.    . clonazePAM (KLONOPIN) 1 MG tablet TAKE ONE TABLET BY MOUTH TWICE DAILY AS NEEDED 60 tablet 5  . hydrochlorothiazide (MICROZIDE) 12.5 MG capsule Take 12.5 mg by mouth as needed.    . traZODone (DESYREL) 50 MG  tablet Take 0.5-1 tablets (25-50 mg total) by mouth at bedtime as needed for sleep. 30 tablet 3  . tretinoin (RETIN-A) 0.025 % cream Apply topically at bedtime. 45 g 0   No current facility-administered medications on file prior to visit.    Review of Systems  Constitutional: Negative for other unusual diaphoresis or sweats HENT: Negative for ear discharge or swelling Eyes: Negative for other worsening visual disturbances Respiratory: Negative for stridor or other  swelling  Gastrointestinal: Negative for worsening distension or other blood Genitourinary: Negative for retention or other urinary change Musculoskeletal: Negative for other MSK pain or swelling Skin: Negative for color change or other new lesions Neurological: Negative for worsening tremors and other numbness  Psychiatric/Behavioral: Negative for worsening agitation or other fatigue All other system neg per pt    Objective:   Physical Exam BP 126/84   Pulse 67   Ht 5\' 4"  (1.626 m)   Wt 168 lb (76.2 kg)   SpO2 98%   BMI 28.84 kg/m  VS noted, mild ill Constitutional: Pt appears in NAD HENT: Head: NCAT.  Right Ear: External ear normal.  Left Ear: External ear normal.  Bilat tm's with mild erythema.  Max sinus areas non tender.  Pharynx with mild erythema, no exudate Eyes: . Pupils are equal, round, and reactive to light. Conjunctivae and EOM are normal Nose: without d/c or deformity Neck: Neck supple. Gross normal ROM Cardiovascular: Normal rate and regular rhythm.   Pulmonary/Chest: Effort normal and breath sounds decreased but without rales or wheezing.  Neurological: Pt is alert. At baseline orientation, motor grossly intact Skin: Skin is warm. No rashes, other new lesions, no LE edema Psychiatric: Pt behavior is normal without agitation  No other exam findings Lab Results  Component Value Date   WBC 6.9 05/26/2017   HGB 13.6 05/26/2017   HCT 39.9 05/26/2017   PLT 311.0 05/26/2017   GLUCOSE 89 05/26/2017   CHOL 196 05/26/2017   TRIG 73.0 05/26/2017   HDL 65.60 05/26/2017   LDLDIRECT 128.5 07/04/2013   LDLCALC 116 (H) 05/26/2017   ALT 14 05/26/2017   AST 16 05/26/2017   NA 137 05/26/2017   K 4.3 05/26/2017   CL 103 05/26/2017   CREATININE 0.87 05/26/2017   BUN 22 05/26/2017   CO2 29 05/26/2017   TSH 1.34 05/26/2017   INR 0.9 07/17/2009   HGBA1C 5.4 05/26/2017        Assessment & Plan:

## 2017-07-28 NOTE — Assessment & Plan Note (Signed)
Recurrent, Mild to mod, for antibx course,  to f/u any worsening symptoms or concerns 

## 2017-07-28 NOTE — Assessment & Plan Note (Signed)
Controlled with trazodone, cont to follow

## 2017-07-29 DIAGNOSIS — S9031XD Contusion of right foot, subsequent encounter: Secondary | ICD-10-CM | POA: Diagnosis not present

## 2017-07-29 DIAGNOSIS — M79671 Pain in right foot: Secondary | ICD-10-CM | POA: Diagnosis not present

## 2017-07-29 DIAGNOSIS — S9781XD Crushing injury of right foot, subsequent encounter: Secondary | ICD-10-CM | POA: Diagnosis not present

## 2017-08-28 DIAGNOSIS — S9031XD Contusion of right foot, subsequent encounter: Secondary | ICD-10-CM | POA: Diagnosis not present

## 2017-08-28 DIAGNOSIS — S9781XD Crushing injury of right foot, subsequent encounter: Secondary | ICD-10-CM | POA: Diagnosis not present

## 2017-09-04 ENCOUNTER — Ambulatory Visit (INDEPENDENT_AMBULATORY_CARE_PROVIDER_SITE_OTHER): Payer: Medicare Other | Admitting: Neurology

## 2017-09-04 DIAGNOSIS — G243 Spasmodic torticollis: Secondary | ICD-10-CM | POA: Diagnosis not present

## 2017-09-04 MED ORDER — ONABOTULINUMTOXINA 100 UNITS IJ SOLR
340.0000 [IU] | Freq: Once | INTRAMUSCULAR | Status: AC
  Administered 2017-09-04: 340 [IU] via INTRAMUSCULAR

## 2017-09-04 NOTE — Procedures (Signed)
[facility]   Procedure Note Botox  Attending: Dr. Wells Guiles Tat  Preoperative Diagnosis(es): Cervical Dystonia  Result History  Onset of effect: immediately No significant dysphagia last time  Physical Exam: head tilt better than previous   Consent obtained from: The patient  Benefits discussed included, but were not limited to decreased muscle tightness, increased joint range of motion, and decreased pain.  Risk discussed included, but were not limited pain and discomfort, bleeding, bruising, excessive weakness, venous thrombosis, muscle atrophy and dysphagia.  A copy of the patient medication guide was given to the patient which explains the blackbox warning.  Informed consent was obtained  Patients identity and treatment sites confirmed yes.     Details of Procedure: Skin was cleaned with alcohol.  A 30 gauge, 1/2 inch needle was introduced to the target muscle (except splenius capitus, posterior approach, where 27 inch, 1 1/2 gauge needle was used).  Prior to injection, the needle plunger was aspirated to make sure the needle was not within a blood vessel.  There was no blood retrieved on aspiration.    Following is a summary of the muscles injected  And the amount of Botulinum toxin used:   Dilution 0.9% preservative free saline mixed with 100 u Botox type A to make 10 U per 0.1cc  Injections  Location Left  Right Units Number of sites        Sternocleidomastoid 60+40 0 100 1  Splenius Capitus, posterior approach 0 80 80 1  Splenius Capitus, lateral approach 0 60 60 1  Levator Scapulae 30  30 0  Trapezius 10/18/19 10/10 70         TOTAL UNITS:   340    Agent: Botulinum Type A ( Onobotulinum Toxin type A ). 4 vials of Botox were used, containing 100 units and freshly diluted with 1 mL of sterile, non-preserved saline    Total injected (Units): 340  Total wasted (Units): 0   Pt tolerated procedure well without complications.   Reinjection is anticipated in 3  months.

## 2017-09-09 ENCOUNTER — Other Ambulatory Visit: Payer: Self-pay | Admitting: Internal Medicine

## 2017-09-21 ENCOUNTER — Telehealth: Payer: Self-pay | Admitting: Neurology

## 2017-09-21 NOTE — Telephone Encounter (Signed)
Naraya called to see about rescheduling her Botox appointment as well as her husbands follow up with Dr. Carles Collet. They will be in [place] at that time. Please Advise. Thanks

## 2017-09-21 NOTE — Telephone Encounter (Signed)
Thanks! I lmom for them.

## 2017-09-21 NOTE — Telephone Encounter (Signed)
Please let patient/husband know that I have nothing around their appt time in December to fit them in.   I rescheduled their appts to January 08, 2018 at 3:00 pm and 3:30 pm.   Thanks so much.

## 2017-10-21 ENCOUNTER — Telehealth: Payer: Self-pay | Admitting: Neurology

## 2017-10-21 NOTE — Telephone Encounter (Signed)
Patient called to see if she can get her Botox Appointment moved to sometime in December before the 12/18/17? She will be going out of town and she was told that having the Botox injection after the 3 month mark is not effective. She is afraid that she will start to have more headaches. She said her husbands appointment can stay the same. Please Call. Thanks

## 2017-10-21 NOTE — Telephone Encounter (Signed)
They let me know they couldn't come in the latter part of the week of 12/18/17, which is Botox day. I don't see any other options. Dr. Carles Collet please advise.

## 2017-10-21 NOTE — Telephone Encounter (Signed)
You can move patient and her husband to the 19th

## 2017-10-21 NOTE — Telephone Encounter (Signed)
Appt made with patient. She is not sure if they will be in town and will call me back to confirm she can make it.

## 2017-11-07 ENCOUNTER — Ambulatory Visit (INDEPENDENT_AMBULATORY_CARE_PROVIDER_SITE_OTHER): Payer: Medicare Other | Admitting: Family Medicine

## 2017-11-07 ENCOUNTER — Encounter: Payer: Self-pay | Admitting: Family Medicine

## 2017-11-07 VITALS — BP 130/80 | HR 58 | Temp 97.8°F | Wt 174.0 lb

## 2017-11-07 DIAGNOSIS — H9202 Otalgia, left ear: Secondary | ICD-10-CM | POA: Diagnosis not present

## 2017-11-07 NOTE — Patient Instructions (Signed)
Earache, Adult An earache, or ear pain, can be caused by many things, including:  An infection.  Ear wax buildup.  Ear pressure.  Something in the ear that should not be there (foreign body).  A sore throat.  Tooth problems.  Jaw problems.  Treatment of the earache will depend on the cause. If the cause is not clear or cannot be determined, you may need to watch your symptoms until your earache goes away or until a cause is found. Follow these instructions at home: Pay attention to any changes in your symptoms. Take these actions to help with your pain:  Take or apply over-the-counter and prescription medicines only as told by your health care provider.  If you were prescribed an antibiotic medicine, use it as told by your health care provider. Do not stop using the antibiotic even if you start to feel better.  Do not put anything in your ear other than medicine that is prescribed by your health care provider.  If directed, apply heat to the affected area as often as told by your health care provider. Use the heat source that your health care provider recommends, such as a moist heat pack or a heating pad. ? Place a towel between your skin and the heat source. ? Leave the heat on for 20-30 minutes. ? Remove the heat if your skin turns bright red. This is especially important if you are unable to feel pain, heat, or cold. You may have a greater risk of getting burned.  If directed, put ice on the ear: ? Put ice in a plastic bag. ? Place a towel between your skin and the bag. ? Leave the ice on for 20 minutes, 2-3 times a day.  Try resting in an upright position instead of lying down. This may help to reduce pressure in your ear and relieve pain.  Chew gum if it helps to relieve your ear pain.  Treat any allergies as told by your health care provider.  Keep all follow-up visits as told by your health care provider. This is important.  Contact a health care provider  if:  Your pain does not improve within 2 days.  Your earache gets worse.  You have new symptoms.  You have a fever. Get help right away if:  You have a severe headache.  You have a stiff neck.  You have trouble swallowing.  You have redness or swelling behind your ear.  You have fluid or blood coming from your ear.  You have hearing loss.  You feel dizzy. This information is not intended to replace advice given to you by your health care provider. Make sure you discuss any questions you have with your health care provider. Document Released: 08/01/2004 Document Revised: 08/12/2016 Document Reviewed: 06/09/2016 Elsevier Interactive Patient Education  2018 [employer].  Try OTC Flonase or Nasacort for nasal congestion symptoms.

## 2017-11-07 NOTE — Progress Notes (Signed)
Subjective:     Patient ID: Boston Service, female   DOB: 09/01/1941, 76 y.o.   MRN: [id number]  HPI Patient seen with left otalgia for 4 days duration. She states she's had similar symptoms occasionally in the past. She has had some nasal congestion. Denies any hearing changes. No vertigo. No ear drainage. No hearing loss. She describes the pain as sharp and somewhat intermittent. No recent swimming. No generalized headaches. No skin rashes. She has history of cervical dystonia and gets Botox injections about every 3 months.  Past Medical History:  Diagnosis Date  . 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 ALLERGIC RHINITIS 01/24/2008  . ANEMIA-IRON DEFICIENCY 01/24/2008   pt denied  . ANXIETY 02/26/2009  . BACK PAIN 09/30/2010  . Breast cancer ([place])   . Cancer of central portion of female breast ([place]) 05/04/2015  . DIZZINESS 02/28/2010  . ELEVATED BLOOD PRESSURE WITHOUT DIAGNOSIS OF HYPERTENSION 02/26/2009   patient denies  . FATIGUE 02/26/2009  . GERD 01/24/2008  . History of blood transfusion ectopic pregnancy  . HYPERLIPIDEMIA 08/16/2008  . SHINGLES 11/06/2010  . TOE PAIN 09/30/2010  . VARICOSE VEINS, LOWER EXTREMITIES 08/16/2008  . 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 AUGMENTATION MAMMAPLASTY Bilateral 10/09/2016  . BACK SURGERY    . ECTOPIC PREGNANCY SURGERY     at 76 yo with salpingectomy  . FOOT SURGERY Right    bunion, hammer toe.  x 2  . FOOT SURGERY Right 05/2016  . MASTECTOMY Right 10/09/2016  . MASTECTOMY W/ SENTINEL NODE BIOPSY Right 10/09/2016  . s/p bilateral breasts implants  12/2007  . s/p lumbar surgury  july 2010  . TONSILLECTOMY      reports that  has never smoked. she has never used smokeless tobacco. She reports that she drinks about 3.6 oz of alcohol per week. She reports that she does not use drugs. family history includes Cancer in her father. Allergies  Allergen Reactions  . No Known Allergies       Review of Systems  Constitutional: Negative for chills and fever.  HENT: Positive for congestion and ear pain. Negative for ear discharge.        Objective:   Physical Exam  Constitutional: She appears well-developed and well-nourished.  HENT:  Right Ear: External ear normal.  Left Ear: External ear normal.  Mouth/Throat: Oropharynx is clear and moist.  Neck: Neck supple.  Cardiovascular: Normal rate.  Pulmonary/Chest: Effort normal. No respiratory distress. She has no wheezes. She has no rales.  Lymphadenopathy:    She has no cervical adenopathy.       Assessment:     Left otalgia with normal exam. Question eustachian tube dysfunction.  No evidence for effusion.      Plan:     Recommend trial over-the-counter Nasacort or Flonase daily and follow-up with primary if symptoms persist  Eulas Post MD [facility]

## 2017-11-13 DIAGNOSIS — L308 Other specified dermatitis: Secondary | ICD-10-CM | POA: Diagnosis not present

## 2017-11-13 DIAGNOSIS — L603 Nail dystrophy: Secondary | ICD-10-CM | POA: Diagnosis not present

## 2017-11-24 ENCOUNTER — Other Ambulatory Visit: Payer: Self-pay | Admitting: Neurology

## 2017-11-24 NOTE — Telephone Encounter (Signed)
Please advise if okay to fill  

## 2017-11-24 NOTE — Telephone Encounter (Signed)
I filled this for 6 months on 06/30/17.  She should not be out yet

## 2017-11-25 ENCOUNTER — Encounter: Payer: Self-pay | Admitting: Internal Medicine

## 2017-11-25 ENCOUNTER — Ambulatory Visit: Payer: Medicare Other | Admitting: Internal Medicine

## 2017-11-25 ENCOUNTER — Other Ambulatory Visit (INDEPENDENT_AMBULATORY_CARE_PROVIDER_SITE_OTHER): Payer: Medicare Other

## 2017-11-25 ENCOUNTER — Ambulatory Visit (INDEPENDENT_AMBULATORY_CARE_PROVIDER_SITE_OTHER): Payer: Medicare Other | Admitting: Internal Medicine

## 2017-11-25 VITALS — BP 128/88 | HR 74 | Temp 97.8°F | Ht 64.0 in | Wt 176.0 lb

## 2017-11-25 DIAGNOSIS — R7302 Impaired glucose tolerance (oral): Secondary | ICD-10-CM

## 2017-11-25 DIAGNOSIS — R5383 Other fatigue: Secondary | ICD-10-CM

## 2017-11-25 DIAGNOSIS — Z23 Encounter for immunization: Secondary | ICD-10-CM | POA: Diagnosis not present

## 2017-11-25 DIAGNOSIS — E785 Hyperlipidemia, unspecified: Secondary | ICD-10-CM

## 2017-11-25 DIAGNOSIS — H6691 Otitis media, unspecified, right ear: Secondary | ICD-10-CM | POA: Diagnosis not present

## 2017-11-25 LAB — CBC WITH DIFFERENTIAL/PLATELET
BASOS ABS: 0.1 10*3/uL (ref 0.0–0.1)
Basophils Relative: 0.8 % (ref 0.0–3.0)
EOS ABS: 0.2 10*3/uL (ref 0.0–0.7)
Eosinophils Relative: 2.1 % (ref 0.0–5.0)
HCT: 42.4 % (ref 36.0–46.0)
Hemoglobin: 14 g/dL (ref 12.0–15.0)
LYMPHS ABS: 1.9 10*3/uL (ref 0.7–4.0)
Lymphocytes Relative: 26 % (ref 12.0–46.0)
MCHC: 33.1 g/dL (ref 30.0–36.0)
MCV: 95.2 fl (ref 78.0–100.0)
Monocytes Absolute: 0.7 10*3/uL (ref 0.1–1.0)
Monocytes Relative: 9.9 % (ref 3.0–12.0)
NEUTROS ABS: 4.5 10*3/uL (ref 1.4–7.7)
NEUTROS PCT: 61.2 % (ref 43.0–77.0)
PLATELETS: 329 10*3/uL (ref 150.0–400.0)
RBC: 4.45 Mil/uL (ref 3.87–5.11)
RDW: 13.2 % (ref 11.5–15.5)
WBC: 7.4 10*3/uL (ref 4.0–10.5)

## 2017-11-25 LAB — BASIC METABOLIC PANEL
BUN: 14 mg/dL (ref 6–23)
CHLORIDE: 104 meq/L (ref 96–112)
CO2: 28 meq/L (ref 19–32)
CREATININE: 0.81 mg/dL (ref 0.40–1.20)
Calcium: 9.3 mg/dL (ref 8.4–10.5)
GFR: 73.36 mL/min (ref >60.00)
Glucose, Bld: 94 mg/dL (ref 70–99)
POTASSIUM: 3.8 meq/L (ref 3.5–5.1)
Sodium: 139 mEq/L (ref 135–145)

## 2017-11-25 LAB — URINALYSIS, ROUTINE W REFLEX MICROSCOPIC
BILIRUBIN URINE: NEGATIVE
HGB URINE DIPSTICK: NEGATIVE
Ketones, ur: NEGATIVE
NITRITE: NEGATIVE
RBC / HPF: NONE SEEN (ref 0–?)
SPECIFIC GRAVITY, URINE: 1.015 (ref 1.000–1.030)
Total Protein, Urine: NEGATIVE
URINE GLUCOSE: NEGATIVE
Urobilinogen, UA: 0.2 (ref 0.0–1.0)
pH: 6 (ref 5.0–8.0)

## 2017-11-25 LAB — LIPID PANEL
CHOL/HDL RATIO: 3
Cholesterol: 211 mg/dL — ABNORMAL HIGH (ref 0–200)
HDL: 72.4 mg/dL (ref >39.00)
LDL CALC: 127 mg/dL — AB (ref 0–99)
NonHDL: 138.76
TRIGLYCERIDES: 57 mg/dL (ref 0.0–149.0)
VLDL: 11.4 mg/dL (ref 0.0–40.0)

## 2017-11-25 LAB — HEPATIC FUNCTION PANEL
ALBUMIN: 4.3 g/dL (ref 3.5–5.2)
ALK PHOS: 57 U/L (ref 39–117)
ALT: 23 U/L (ref 0–35)
AST: 23 U/L (ref 0–37)
BILIRUBIN DIRECT: 0.1 mg/dL (ref 0.0–0.3)
Total Bilirubin: 0.5 mg/dL (ref 0.2–1.2)
Total Protein: 7.6 g/dL (ref 6.0–8.3)

## 2017-11-25 LAB — TSH: TSH: 1.75 u[IU]/mL (ref 0.35–4.50)

## 2017-11-25 MED ORDER — CEFUROXIME AXETIL 250 MG PO TABS: 250.0000 mg | ORAL_TABLET | Freq: Two times a day (BID) | ORAL | 0 refills | Status: AC

## 2017-11-25 NOTE — Progress Notes (Signed)
Subjective:    Patient ID: [name] Bell, female    DOB: November 16, 1941, 76 y.o.   MRN: [id number]  HPI  Here to f/u; overall doing ok,  Pt denies chest pain, increasing sob or doe, wheezing, orthopnea, PND, increased LE swelling, palpitations, dizziness or syncope.  Pt denies new neurological symptoms such as new headache, or facial or extremity weakness or numbness.  Pt denies polydipsia, polyuria, or low sugar episode.  Pt states overall good compliance with meds, mostly trying to follow appropriate diet, with wt overall stable,  but little exercise however.  Also has right ear pain, fever and muffled hearing for 3 days.  Does c/o ongoing fatigue, but denies signficant daytime hypersomnolence.  Due for Dr Tat dec 20 Past Medical History:  Diagnosis Date  . 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 ALLERGIC RHINITIS 01/24/2008  . ANEMIA-IRON DEFICIENCY 01/24/2008   pt denied  . ANXIETY 02/26/2009  . BACK PAIN 09/30/2010  . Breast cancer ([place])   . Cancer of central portion of female breast ([place]) 05/04/2015  . DIZZINESS 02/28/2010  . ELEVATED BLOOD PRESSURE WITHOUT DIAGNOSIS OF HYPERTENSION 02/26/2009   patient denies  . FATIGUE 02/26/2009  . GERD 01/24/2008  . History of blood transfusion ectopic pregnancy  . HYPERLIPIDEMIA 08/16/2008  . SHINGLES 11/06/2010  . TOE PAIN 09/30/2010  . VARICOSE VEINS, LOWER EXTREMITIES 08/16/2008  . 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 AUGMENTATION MAMMAPLASTY Bilateral 10/09/2016  . BACK SURGERY    . BREAST LUMPECTOMY Right 05/14/2015   Procedure: RIGHT BREAST LUMPECTOMY;  Surgeon: Alphonsa Overall, MD;  Location: [place];  Bell: General;  Laterality: Right;  . BREAST RECONSTRUCTION WITH PLACEMENT OF TISSUE EXPANDER AND FLEX HD (ACELLULAR HYDRATED DERMIS) Right 10/09/2016   Procedure: IMMEDIATE RIGHT BREAST RECONSTRUCTION WITH PLACEMENT OF SILICONE GEL IMPLANTS OR TISSUE EXPANDER AND ACELLULAR DERMAL MATRIX;   Surgeon: Crissie Reese, MD;  Location: [place];  Bell: Plastics;  Laterality: Right;  . ECTOPIC PREGNANCY SURGERY     at 76 yo with salpingectomy  . FOOT SURGERY Right    bunion, hammer toe.  x 2  . FOOT SURGERY Right 05/2016  . INCISION AND DRAINAGE OF WOUND Right 10/09/2016   Procedure: IRRIGATION AND DEBRIDEMENT BREAST HEMATOMA;  Surgeon: Crissie Reese, MD;  Location: [place];  Bell: Plastics;  Laterality: Right;  . MASTECTOMY Right 10/09/2016  . MASTECTOMY W/ SENTINEL NODE BIOPSY Right 10/09/2016  . MASTECTOMY W/ SENTINEL NODE BIOPSY Right 10/09/2016   Procedure: RIGHT MASTECTOMY WITH SENTINEL LYMPH NODE BIOPSY;  Surgeon: Alphonsa Overall, MD;  Location: [place];  Bell: General;  Laterality: Right;  . s/p bilateral breasts implants  12/2007  . s/p lumbar surgury  july 2010  . TONSILLECTOMY      reports that  has never smoked. she has never used smokeless tobacco. She reports that she drinks about 3.6 oz of alcohol per week. She reports that she does not use drugs. family history includes Cancer in her father. Allergies  Allergen Reactions  . No Known Allergies    Current Outpatient Medications on File Prior to Visit  Medication Sig Dispense Refill  . acetaminophen (TYLENOL) 325 MG tablet Take 650 mg by mouth every 6 (six) hours as needed.    . clonazePAM (KLONOPIN) 1 MG tablet TAKE ONE TABLET TWICE DAILY 60 tablet 5  . hydrochlorothiazide (HYDRODIURIL) 25 MG tablet TAKE 1 TABLET BY MOUTH DAILY 90 tablet 0  . tretinoin (RETIN-A)  0.025 % cream Apply topically at bedtime. 45 g 0   No current facility-administered medications on file prior to visit.    Review of Systems  Constitutional: Negative for other unusual diaphoresis or sweats HENT: Negative for ear discharge or swelling Eyes: Negative for other worsening visual disturbances Respiratory: Negative for stridor or other swelling  Gastrointestinal: Negative for worsening distension or other blood Genitourinary: Negative for  retention or other urinary change Musculoskeletal: Negative for other MSK pain or swelling Skin: Negative for color change or other new lesions Neurological: Negative for worsening tremors and other numbness  Psychiatric/Behavioral: Negative for worsening agitation or other fatigue \\All  other system neg per pt    Objective:   Physical Exam BP 128/88   Pulse 74   Temp 97.8 F (36.6 C) (Oral)   Ht 5\' 4"  (1.626 m)   Wt 176 lb (79.8 kg)   SpO2 95%   BMI 30.21 kg/m  VS noted, mild ill Constitutional: Pt appears in NAD HENT: Head: NCAT.  Right Ear: External ear normal. Right TM with severe erythema and mild bulging Left Ear: External ear normal.  Eyes: . Pupils are equal, round, and reactive to light. Conjunctivae and EOM are normal Nose: without d/c or deformity Neck: Neck supple. Gross normal ROM Cardiovascular: Normal rate and regular rhythm.   Pulmonary/Chest: Effort normal and breath sounds without rales or wheezing.  Abd:  Soft, NT, ND, + BS, no organomegaly Neurological: Pt is alert. At baseline orientation, motor grossly intact Skin: Skin is warm. No rashes, other new lesions, no LE edema Psychiatric: Pt behavior is normal without agitation  No other exam findings    Assessment & Plan:

## 2017-11-25 NOTE — Patient Instructions (Addendum)
You had the flu shot today, and the Tdap tetanus shot  Please take all new medication as prescribed - the antibiotic  Please continue all other medications as before, and refills have been done if requested.  Please have the pharmacy call with any other refills you may need.  Please continue your efforts at being more active, low cholesterol diet, and weight control.  You are otherwise up to date with prevention measures today.  Please keep your appointments with your specialists as you may have planned  Please go to the LAB in the Basement (turn left off the elevator) for the tests to be done today  You will be contacted by phone if any changes need to be made immediately.  Otherwise, you will receive a letter about your results with an explanation, but please check with MyChart first.  Please remember to sign up for MyChart if you have not done so, as this will be important to you in the future with finding out test results, communicating by private email, and scheduling acute appointments online when needed.  Please return in 6 months, or sooner if needed

## 2017-11-25 NOTE — Telephone Encounter (Signed)
Patient picked up last RX yesterday. I spoke with [facility]. Request is for the future. So she filled RX in July, monthly through December. RX called to [employer].

## 2017-11-28 DIAGNOSIS — H6691 Otitis media, unspecified, right ear: Secondary | ICD-10-CM | POA: Insufficient documentation

## 2017-11-28 NOTE — Assessment & Plan Note (Signed)
stable overall by history and exam, recent data reviewed with pt, and pt to continue medical treatment as before,  to f/u any worsening symptoms or concerns Lab Results  Component Value Date   HGBA1C 5.4 05/26/2017   

## 2017-11-28 NOTE — Assessment & Plan Note (Signed)
stable overall by history and exam, recent data reviewed with pt, and pt to continue medical treatment as before,  to f/u any worsening symptoms or concerns, for f/u lab today, lower chol diet

## 2017-11-28 NOTE — Assessment & Plan Note (Signed)
Mild to mod, for antibx course,  to f/u any worsening symptoms or concerns 

## 2017-11-28 NOTE — Assessment & Plan Note (Signed)
Etiology unclear, Exam otherwise benign, to check labs as documented, follow with expectant management  

## 2017-12-09 DIAGNOSIS — H2513 Age-related nuclear cataract, bilateral: Secondary | ICD-10-CM | POA: Diagnosis not present

## 2017-12-16 ENCOUNTER — Ambulatory Visit (INDEPENDENT_AMBULATORY_CARE_PROVIDER_SITE_OTHER): Payer: Medicare Other | Admitting: Neurology

## 2017-12-16 DIAGNOSIS — G243 Spasmodic torticollis: Secondary | ICD-10-CM

## 2017-12-16 MED ORDER — ONABOTULINUMTOXINA 100 UNITS IJ SOLR
340.0000 [IU] | Freq: Once | INTRAMUSCULAR | Status: AC
  Administered 2017-12-16: 340 [IU] via INTRAMUSCULAR

## 2017-12-16 NOTE — Procedures (Signed)
[facility]   Procedure Note Botox  Attending: Dr. Wells Guiles Shyanna Klingel  Preoperative Diagnosis(es): Cervical Dystonia  Result History  Onset of effect: immediately Had dysphagia again this past time but head tilt better.  Wore off about 5 days ago     Consent obtained from: The patient  Benefits discussed included, but were not limited to decreased muscle tightness, increased joint range of motion, and decreased pain.  Risk discussed included, but were not limited pain and discomfort, bleeding, bruising, excessive weakness, venous thrombosis, muscle atrophy and dysphagia.  A copy of the patient medication guide was given to the patient which explains the blackbox warning.  Informed consent was obtained  Patients identity and treatment sites confirmed yes.     Details of Procedure: Skin was cleaned with alcohol.  A 30 gauge, 1/2 inch needle was introduced to the target muscle (except splenius capitus, posterior approach, where 27 inch, 1 1/2 gauge needle was used).  Prior to injection, the needle plunger was aspirated to make sure the needle was not within a blood vessel.  There was no blood retrieved on aspiration.    Following is a summary of the muscles injected  And the amount of Botulinum toxin used:   Dilution 0.9% preservative free saline mixed with 100 u Botox type A to make 10 U per 0.1cc  Injections  Location Left  Right Units Number of sites        Sternocleidomastoid 60+40 0 100 1  Splenius Capitus, posterior approach 0 80 80 1  Splenius Capitus, lateral approach 0 60 60 1  Levator Scapulae 30  30 0  Trapezius 10/18/19 10/10 70         TOTAL UNITS:   340    Agent: Botulinum Type A ( Onobotulinum Toxin type A ). 4 vials of Botox were used, containing 100 units and freshly diluted with 1 mL of sterile, non-preserved saline    Total injected (Units): 340  Total wasted (Units): 30   Pt tolerated procedure well without complications.   Reinjection is anticipated in  3 months.

## 2017-12-18 ENCOUNTER — Ambulatory Visit: Payer: Medicare Other | Admitting: Neurology

## 2017-12-26 ENCOUNTER — Other Ambulatory Visit: Payer: Self-pay | Admitting: Internal Medicine

## 2018-01-08 ENCOUNTER — Ambulatory Visit: Payer: Medicare Other | Admitting: Neurology

## 2018-03-03 DIAGNOSIS — Z9882 Breast implant status: Secondary | ICD-10-CM | POA: Diagnosis not present

## 2018-03-03 DIAGNOSIS — D0591 Unspecified type of carcinoma in situ of right breast: Secondary | ICD-10-CM | POA: Diagnosis not present

## 2018-03-09 DIAGNOSIS — L603 Nail dystrophy: Secondary | ICD-10-CM | POA: Diagnosis not present

## 2018-03-09 DIAGNOSIS — L301 Dyshidrosis [pompholyx]: Secondary | ICD-10-CM | POA: Diagnosis not present

## 2018-03-09 DIAGNOSIS — L300 Nummular dermatitis: Secondary | ICD-10-CM | POA: Diagnosis not present

## 2018-03-19 ENCOUNTER — Ambulatory Visit (INDEPENDENT_AMBULATORY_CARE_PROVIDER_SITE_OTHER): Payer: Medicare Other | Admitting: Neurology

## 2018-03-19 DIAGNOSIS — G4700 Insomnia, unspecified: Secondary | ICD-10-CM

## 2018-03-19 DIAGNOSIS — G243 Spasmodic torticollis: Secondary | ICD-10-CM | POA: Diagnosis not present

## 2018-03-19 MED ORDER — ONABOTULINUMTOXINA 100 UNITS IJ SOLR
340.0000 [IU] | Freq: Once | INTRAMUSCULAR | Status: AC
  Administered 2018-03-19: 340 [IU] via INTRAMUSCULAR

## 2018-03-19 MED ORDER — TRAZODONE HCL 50 MG PO TABS: 50.0000 mg | ORAL_TABLET | Freq: Every day | ORAL | 1 refills | Status: DC

## 2018-03-19 NOTE — Patient Instructions (Signed)
1.  Take trazodone - 50 mg  - 30 min to one hour before bedtime

## 2018-03-19 NOTE — Progress Notes (Signed)
[name] Bell was seen today for follow-up.  She was here for Botox injections, but brings up a different issue.  She initially asked if she can increase her clonazepam.  She is already on 1 mg twice per day.  The reason she wants to increase it, however, is because of insomnia.  She wakes up every day at 4 AM and can no longer sleep, so she gets ready for the day and then is tired during the day.  She had no side effects with her clonazepam.  Her last series of Botox injections worked well and kicked in fast, but she did have dysphagia.  Her husband no longer let her eat alone.   PREVIOUS MEDICATIONS: Clonazepam  ALLERGIES:   Allergies  Allergen Reactions  . No Known Allergies     CURRENT MEDICATIONS:  Current Outpatient Medications on File Prior to Visit  Medication Sig Dispense Refill  . acetaminophen (TYLENOL) 325 MG tablet Take 650 mg by mouth every 6 (six) hours as needed.    . clonazePAM (KLONOPIN) 1 MG tablet TAKE ONE TABLET TWICE DAILY 60 tablet 5  . hydrochlorothiazide (HYDRODIURIL) 25 MG tablet TAKE 1 TABLET BY MOUTH DAILY 90 tablet 0  . tretinoin (RETIN-A) 0.025 % cream Apply topically at bedtime. 45 g 0  . tretinoin (RETIN-A) 0.05 % cream APPLY TO AFFECTED AREA EVERY BEDTIME 45 g 0   No current facility-administered medications on file prior to visit.     PAST MEDICAL HISTORY:   Past Medical History:  Diagnosis Date  . 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 ALLERGIC RHINITIS 01/24/2008  . ANEMIA-IRON DEFICIENCY 01/24/2008   pt denied  . ANXIETY 02/26/2009  . BACK PAIN 09/30/2010  . Breast cancer ([place])   . Cancer of central portion of female breast ([place]) 05/04/2015  . DIZZINESS 02/28/2010  . ELEVATED BLOOD PRESSURE WITHOUT DIAGNOSIS OF HYPERTENSION 02/26/2009   patient denies  . FATIGUE 02/26/2009  . GERD 01/24/2008  . History of blood transfusion ectopic pregnancy  . HYPERLIPIDEMIA 08/16/2008  . SHINGLES 11/06/2010  . TOE PAIN 09/30/2010  . VARICOSE VEINS,  LOWER EXTREMITIES 08/16/2008  . 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    PAST SURGICAL HISTORY:   Past Surgical History:  Procedure Laterality Date  . AUGMENTATION MAMMAPLASTY Bilateral 10/09/2016  . BACK SURGERY    . BREAST LUMPECTOMY Right 05/14/2015   Procedure: RIGHT BREAST LUMPECTOMY;  Surgeon: Alphonsa Overall, MD;  Location: [place];  Service: General;  Laterality: Right;  . BREAST RECONSTRUCTION WITH PLACEMENT OF TISSUE EXPANDER AND FLEX HD (ACELLULAR HYDRATED DERMIS) Right 10/09/2016   Procedure: IMMEDIATE RIGHT BREAST RECONSTRUCTION WITH PLACEMENT OF SILICONE GEL IMPLANTS OR TISSUE EXPANDER AND ACELLULAR DERMAL MATRIX;  Surgeon: Crissie Reese, MD;  Location: [place];  Service: Plastics;  Laterality: Right;  . ECTOPIC PREGNANCY SURGERY     at 77 yo with salpingectomy  . FOOT SURGERY Right    bunion, hammer toe.  x 2  . FOOT SURGERY Right 05/2016  . INCISION AND DRAINAGE OF WOUND Right 10/09/2016   Procedure: IRRIGATION AND DEBRIDEMENT BREAST HEMATOMA;  Surgeon: Crissie Reese, MD;  Location: [place];  Service: Plastics;  Laterality: Right;  . MASTECTOMY Right 10/09/2016  . MASTECTOMY W/ SENTINEL NODE BIOPSY Right 10/09/2016  . MASTECTOMY W/ SENTINEL NODE BIOPSY Right 10/09/2016   Procedure: RIGHT MASTECTOMY WITH SENTINEL LYMPH NODE BIOPSY;  Surgeon: Alphonsa Overall, MD;  Location: [place];  Service: General;  Laterality: Right;  . s/p bilateral breasts implants  12/2007  .  s/p lumbar surgury  july 2010  . TONSILLECTOMY      SOCIAL HISTORY:   Social History   Socioeconomic History  . Marital status: Single    Spouse name: Not on file  . Number of children: 2  . Years of education: Not on file  . Highest education level: Not on file  Occupational History  . Occupation: unemployed housewife  Social Needs  . Financial resource strain: Not on file  . Food insecurity:    Worry: Not on file    Inability: Not on file  . Transportation needs:    Medical: Not on file    Non-medical: Not on file    Tobacco Use  . Smoking status: Never Smoker  . Smokeless tobacco: Never Used  Substance and Sexual Activity  . Alcohol use: Yes    Alcohol/week: 3.6 oz    Types: 6 Glasses of wine per week    Comment: 1 glass wine/day, 3-4 per week  . Drug use: No  . Sexual activity: Yes    Partners: Male  Lifestyle  . Physical activity:    Days per week: Not on file    Minutes per session: Not on file  . Stress: Not on file  Relationships  . Social connections:    Talks on phone: Not on file    Gets together: Not on file    Attends religious service: Not on file    Active member of club or organization: Not on file    Attends meetings of clubs or organizations: Not on file    Relationship status: Not on file  . Intimate partner violence:    Fear of current or ex partner: Not on file    Emotionally abused: Not on file    Physically abused: Not on file    Forced sexual activity: Not on file  Other Topics Concern  . Not on file  Social History Narrative  . Not on file    FAMILY HISTORY:   Family Status  Relation Name Status  . Mother  Deceased at age 04-12-09      osteoporosis, enlarged heart  . Father  Deceased       lung CA  . Brother  Deceased       MI, asthma  . Brother  Alive  . Sister  Alive       2, healthy  . Child  Alive       2, healthy  . Neg Hx  (Not Specified)    ROS:  Review of Systems  Constitutional: Negative.   HENT: Negative.   Eyes: Negative.   Respiratory: Negative.   Cardiovascular: Negative.   Gastrointestinal: Negative.   Genitourinary: Negative.   Musculoskeletal: Positive for neck pain.  Skin: Negative.   Neurological: Positive for tremors (head).  Endo/Heme/Allergies: Negative.   Psychiatric/Behavioral: The patient has insomnia.      PHYSICAL EXAMINATION:    VITALS:   There were no vitals filed for this visit.  GEN:  Normal appears female in no acute distress.  Appears stated age.   NEUROLOGICAL:  Orientation:  The patient is  alert and oriented x 3.   Cranial nerves: There is good facial symmetry.  Speech is fluent and clear. Soft palate rises symmetrically and there is no tongue deviation. Hearing is intact to conversational tone. Tone: Tone is good throughout. Sensation: Sensation is intact to light touch touch throughout Coordination:  The patient has no difficulty with RAM's or FNF bilaterally.  Motor: Strength is 5/5 in the bilateral upper and lower extremities.  Shoulder shrug is equal bilaterally.  There is no pronator drift.  There are no fasciculations noted. Gait and Station: The patient is able to ambulate without difficulty.   Abnormal movements: The patient's head is turned right, mildly side bent to the L.     IMPRESSION/PLAN:  1. Cervical dystonia  -Discussed again her issue with dysphagia.  This is likely due to levator scapulae injections.  We have limited this in the past and she had no dysphagia, but was not happy with control of dystonia.  She understands risks.  She wants to continue injections as is.  2.  Insomnia  -Patient wanted to increase her clonazepam, but I told her I did not want to do that.  I would rather add something else that is primarily for sleep, rather than going on Betadine.  We will start trazodone, 50 mg at night.  Risks, benefits, side effects and alternative therapies were discussed.  The opportunity to ask questions was given and they were answered to the best of my ability.  The patient expressed understanding and willingness to follow the outlined treatment protocols.   3.  F/u in June.

## 2018-03-19 NOTE — Procedures (Signed)
[facility] Clinic   Procedure Note Botox  Attending: Dr. Wells Guiles Tat  Preoperative Diagnosis(es): Cervical Dystonia  Result History  Onset of effect: immediately Had dysphagia again this past time but head tilt better.  Dysphagia better over the last 3 weeks     Consent obtained from: The patient  Benefits discussed included, but were not limited to decreased muscle tightness, increased joint range of motion, and decreased pain.  Risk discussed included, but were not limited pain and discomfort, bleeding, bruising, excessive weakness, venous thrombosis, muscle atrophy and dysphagia.  A copy of the patient medication guide was given to the patient which explains the blackbox warning.  Informed consent was obtained  Patients identity and treatment sites confirmed yes.     Details of Procedure: Skin was cleaned with alcohol.  A 30 gauge, 1/2 inch needle was introduced to the target muscle (except splenius capitus, posterior approach, where 27 inch, 1 1/2 gauge needle was used).  Prior to injection, the needle plunger was aspirated to make sure the needle was not within a blood vessel.  There was no blood retrieved on aspiration.    Following is a summary of the muscles injected  And the amount of Botulinum toxin used:   Dilution 0.9% preservative free saline mixed with 100 u Botox type A to make 10 U per 0.1cc  Injections  Location Left  Right Units Number of sites        Sternocleidomastoid 60+40 0 100 1  Splenius Capitus, posterior approach 0 80 80 1  Splenius Capitus, lateral approach 0 60 60 1  Levator Scapulae 30  30 0  Trapezius 10/18/19 10/10 70         TOTAL UNITS:   340    Agent: Botulinum Type A ( Onobotulinum Toxin type A ). 4 vials of Botox were used, containing 100 units and freshly diluted with 1 mL of sterile, non-preserved saline    Total injected (Units): 340  Total wasted (Units): 0   Pt tolerated procedure well without complications.   Reinjection is  anticipated in 3 months.

## 2018-03-24 DIAGNOSIS — M79671 Pain in right foot: Secondary | ICD-10-CM | POA: Diagnosis not present

## 2018-03-29 ENCOUNTER — Encounter: Payer: Self-pay | Admitting: Internal Medicine

## 2018-03-29 ENCOUNTER — Ambulatory Visit (INDEPENDENT_AMBULATORY_CARE_PROVIDER_SITE_OTHER): Payer: Medicare Other | Admitting: Internal Medicine

## 2018-03-29 ENCOUNTER — Other Ambulatory Visit (INDEPENDENT_AMBULATORY_CARE_PROVIDER_SITE_OTHER): Payer: Medicare Other

## 2018-03-29 ENCOUNTER — Ambulatory Visit: Payer: Self-pay

## 2018-03-29 ENCOUNTER — Ambulatory Visit (INDEPENDENT_AMBULATORY_CARE_PROVIDER_SITE_OTHER)
Admission: RE | Admit: 2018-03-29 | Discharge: 2018-03-29 | Disposition: A | Discharge status: Home / Self Care | Payer: Medicare Other | Source: Ambulatory Visit | Attending: Internal Medicine | Admitting: Internal Medicine

## 2018-03-29 VITALS — BP 124/86 | HR 68 | Temp 98.0°F | Ht 64.0 in | Wt 174.0 lb

## 2018-03-29 DIAGNOSIS — R109 Unspecified abdominal pain: Secondary | ICD-10-CM | POA: Insufficient documentation

## 2018-03-29 DIAGNOSIS — F411 Generalized anxiety disorder: Secondary | ICD-10-CM

## 2018-03-29 DIAGNOSIS — R1010 Upper abdominal pain, unspecified: Secondary | ICD-10-CM | POA: Diagnosis not present

## 2018-03-29 DIAGNOSIS — R7302 Impaired glucose tolerance (oral): Secondary | ICD-10-CM | POA: Diagnosis not present

## 2018-03-29 LAB — CBC WITH DIFFERENTIAL/PLATELET
BASOS ABS: 0 10*3/uL (ref 0.0–0.1)
Basophils Relative: 0.7 % (ref 0.0–3.0)
EOS PCT: 1.9 % (ref 0.0–5.0)
Eosinophils Absolute: 0.1 10*3/uL (ref 0.0–0.7)
HEMATOCRIT: 41.7 % (ref 36.0–46.0)
HEMOGLOBIN: 14 g/dL (ref 12.0–15.0)
LYMPHS ABS: 1.9 10*3/uL (ref 0.7–4.0)
LYMPHS PCT: 30.5 % (ref 12.0–46.0)
MCHC: 33.6 g/dL (ref 30.0–36.0)
MCV: 93.8 fl (ref 78.0–100.0)
Monocytes Absolute: 0.7 10*3/uL (ref 0.1–1.0)
Monocytes Relative: 11.5 % (ref 3.0–12.0)
NEUTROS PCT: 55.4 % (ref 43.0–77.0)
Neutro Abs: 3.5 10*3/uL (ref 1.4–7.7)
Platelets: 317 10*3/uL (ref 150.0–400.0)
RBC: 4.44 Mil/uL (ref 3.87–5.11)
RDW: 13.5 % (ref 11.5–15.5)
WBC: 6.4 10*3/uL (ref 4.0–10.5)

## 2018-03-29 LAB — HEPATIC FUNCTION PANEL
ALBUMIN: 4.2 g/dL (ref 3.5–5.2)
ALK PHOS: 53 U/L (ref 39–117)
ALT: 13 U/L (ref 0–35)
AST: 13 U/L (ref 0–37)
Bilirubin, Direct: 0.1 mg/dL (ref 0.0–0.3)
TOTAL PROTEIN: 7.5 g/dL (ref 6.0–8.3)
Total Bilirubin: 0.6 mg/dL (ref 0.2–1.2)

## 2018-03-29 LAB — LIPASE: LIPASE: 10 U/L — AB (ref 11.0–59.0)

## 2018-03-29 LAB — BASIC METABOLIC PANEL
BUN: 9 mg/dL (ref 6–23)
CO2: 29 meq/L (ref 19–32)
CREATININE: 0.68 mg/dL (ref 0.40–1.20)
Calcium: 9.3 mg/dL (ref 8.4–10.5)
Chloride: 101 mEq/L (ref 96–112)
GFR: 89.69 mL/min (ref >60.00)
Glucose, Bld: 97 mg/dL (ref 70–99)
Potassium: 3.9 mEq/L (ref 3.5–5.1)
SODIUM: 137 meq/L (ref 135–145)

## 2018-03-29 NOTE — Telephone Encounter (Signed)
Pt. Reports she had this same abdominal pain 30 years ago and "it was all stress related." States she "feels a little better this morning, but would like to be seen today." Appointment made. Instructed pt. If abdominal pain gets worse to go to ED. Verbalizes understanding.  Reason for Disposition . [1] MODERATE pain (e.g., interferes with normal activities) AND [2] pain comes and goes (cramps) AND [3] present > 24 hours  (Exception: pain with Vomiting or Diarrhea - see that Guideline)  Answer Assessment - Initial Assessment Questions 1. LOCATION: "Where does it hurt?"      Lower right quad of abdomen 2. RADIATION: "Does the pain shoot anywhere else?" (e.g., chest, back)     No 3. ONSET: "When did the pain begin?" (e.g., minutes, hours or days ago)      Started 3 days 4. SUDDEN: "Gradual or sudden onset?"     Gradual 5. PATTERN "Does the pain come and go, or is it constant?"    - If constant: "Is it getting better, staying the same, or worsening?"      (Note: Constant means the pain never goes away completely; most serious pain is constant and it progresses)     - If intermittent: "How long does it last?" "Do you have pain now?"     (Note: Intermittent means the pain goes away completely between bouts)     Comes and goes 6. SEVERITY: "How bad is the pain?"  (e.g., Scale 1-10; mild, moderate, or severe)   - MILD (1-3): doesn't interfere with normal activities, abdomen soft and not tender to touch    - MODERATE (4-7): interferes with normal activities or awakens from sleep, tender to touch    - SEVERE (8-10): excruciating pain, doubled over, unable to do any normal activities      5 7. RECURRENT SYMPTOM: "Have you ever had this type of abdominal pain before?" If so, ask: "When was the last time?" and "What happened that time?"      yES - 30 YEARS 8. CAUSE: "What do you think is causing the abdominal pain?"     Unsure 9. RELIEVING/AGGRAVATING FACTORS: "What makes it better or worse?" (e.g.,  movement, antacids, bowel movement)     Bowel movement helped 10. OTHER SYMPTOMS: "Has there been any vomiting, diarrhea, constipation, or urine problems?"       No 11. PREGNANCY: "Is there any chance you are pregnant?" "When was your last menstrual period?"       No  Protocols used: ABDOMINAL PAIN - [facility]

## 2018-03-29 NOTE — Patient Instructions (Signed)
Please continue all other medications as before, and refills have been done if requested.  Please have the pharmacy call with any other refills you may need.  Please keep your appointments with your specialists as you may have planned  Please go to the XRAY Department in the Basement (go straight as you get off the elevator) for the x-ray testing  Please go to the LAB in the Basement (turn left off the elevator) for the tests to be done today  You will be contacted by phone if any changes need to be made immediately.  Otherwise, you will receive a letter about your results with an explanation, but please check with MyChart first.  Please remember to sign up for MyChart if you have not done so, as this will be important to you in the future with finding out test results, communicating by private email, and scheduling acute appointments online when needed. 

## 2018-03-29 NOTE — Assessment & Plan Note (Signed)
Stable, decline change in tx,  to f/u any worsening symptoms or concerns

## 2018-03-29 NOTE — Assessment & Plan Note (Signed)
I suspect constipation but she is thinking not the case, for films today, consider laxative, also for labs as ordered

## 2018-03-29 NOTE — Assessment & Plan Note (Signed)
stable overall by history and exam, recent data reviewed with pt, and pt to continue medical treatment as before,  to f/u any worsening symptoms or concerns Lab Results  Component Value Date   HGBA1C 5.4 05/26/2017   

## 2018-03-29 NOTE — Progress Notes (Signed)
Subjective:    Patient ID: [name] Bell, female    DOB: March 27, 1941, 77 y.o.   MRN: [id number]  HPI  Here with myriad of concerns, with first c/w 4 days onset bilat upper abd pain, mild to mod, no radiation, more of a a dull aching, no n/v, denies constipation, seems to have regular BM. Denies worsening n/v, diarrhea or blood but does have mild heartburn and dysphagia worse after last botox injection that seems to happen each time for a few days after.  Discomfort is worse to turn in bed from left to right due to abd pain associated with unusual generalized extremity weakness  Finds it more difficult to stand up recently, started to have headaches again.  Been trying to cut back on calories last few days, but more fluids.  Right leg aches as well. Also did have last botox last month and recalls an episode few days of right arm and leg thousands of pins and needles.  Also feels "cold all the time" but no fever, chills.  Denies urinary symptoms such as dysuria, frequency, urgency, flank pain, hematuria.  No other new complaints or interval hx   Pt denies polydipsia, polyuria  Denies worsening depressive symptoms, suicidal ideation, or panic; has ongoing anxiety Past Medical History:  Diagnosis Date  . 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 ALLERGIC RHINITIS 01/24/2008  . ANEMIA-IRON DEFICIENCY 01/24/2008   pt denied  . ANXIETY 02/26/2009  . BACK PAIN 09/30/2010  . Breast cancer ([place])   . Cancer of central portion of female breast ([place]) 05/04/2015  . DIZZINESS 02/28/2010  . ELEVATED BLOOD PRESSURE WITHOUT DIAGNOSIS OF HYPERTENSION 02/26/2009   patient denies  . FATIGUE 02/26/2009  . GERD 01/24/2008  . History of blood transfusion ectopic pregnancy  . HYPERLIPIDEMIA 08/16/2008  . SHINGLES 11/06/2010  . TOE PAIN 09/30/2010  . VARICOSE VEINS, LOWER EXTREMITIES 08/16/2008  . 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 AUGMENTATION MAMMAPLASTY  Bilateral 10/09/2016  . BACK SURGERY    . BREAST LUMPECTOMY Right 05/14/2015   Procedure: RIGHT BREAST LUMPECTOMY;  Surgeon: Alphonsa Overall, MD;  Location: [facility];  Bell: General;  Laterality: Right;  . BREAST RECONSTRUCTION WITH PLACEMENT OF TISSUE EXPANDER AND FLEX HD (ACELLULAR HYDRATED DERMIS) Right 10/09/2016   Procedure: IMMEDIATE RIGHT BREAST RECONSTRUCTION WITH PLACEMENT OF SILICONE GEL IMPLANTS OR TISSUE EXPANDER AND ACELLULAR DERMAL MATRIX;  Surgeon: Crissie Reese, MD;  Location: [facility];  Bell: Plastics;  Laterality: Right;  . ECTOPIC PREGNANCY SURGERY     at 77 yo with salpingectomy  . FOOT SURGERY Right    bunion, hammer toe.  x 2  . FOOT SURGERY Right 05/2016  . INCISION AND DRAINAGE OF WOUND Right 10/09/2016   Procedure: IRRIGATION AND DEBRIDEMENT BREAST HEMATOMA;  Surgeon: Crissie Reese, MD;  Location: [facility];  Bell: Plastics;  Laterality: Right;  . MASTECTOMY Right 10/09/2016  . MASTECTOMY W/ SENTINEL NODE BIOPSY Right 10/09/2016  . MASTECTOMY W/ SENTINEL NODE BIOPSY Right 10/09/2016   Procedure: RIGHT MASTECTOMY WITH SENTINEL LYMPH NODE BIOPSY;  Surgeon: Alphonsa Overall, MD;  Location: [facility];  Bell: General;  Laterality: Right;  . s/p bilateral breasts implants  12/2007  . s/p lumbar surgury  july 2010  . TONSILLECTOMY      reports that she has never smoked. She has never used smokeless tobacco. She reports that she drinks about 3.6 oz of alcohol per week. She reports that she does not use  drugs. family history includes Cancer in her father. Allergies  Allergen Reactions  . No Known Allergies    Current Outpatient Medications on File Prior to Visit  Medication Sig Dispense Refill  . acetaminophen (TYLENOL) 325 MG tablet Take 650 mg by mouth every 6 (six) hours as needed.    . clonazePAM (KLONOPIN) 1 MG tablet TAKE ONE TABLET TWICE DAILY 60 tablet 5  . hydrochlorothiazide (HYDRODIURIL) 25 MG tablet TAKE 1 TABLET BY MOUTH DAILY 90 tablet 0  . traZODone (DESYREL) 50 MG  tablet Take 1 tablet (50 mg total) by mouth at bedtime. 90 tablet 1  . tretinoin (RETIN-A) 0.025 % cream Apply topically at bedtime. 45 g 0  . tretinoin (RETIN-A) 0.05 % cream APPLY TO AFFECTED AREA EVERY BEDTIME 45 g 0   No current facility-administered medications on file prior to visit.    Review of Systems  Constitutional: Negative for other unusual diaphoresis or sweats HENT: Negative for ear discharge or swelling Eyes: Negative for other worsening visual disturbances Respiratory: Negative for stridor or other swelling  Gastrointestinal: Negative for worsening distension or other blood Genitourinary: Negative for retention or other urinary change Musculoskeletal: Negative for other MSK pain or swelling Skin: Negative for color change or other new lesions Neurological: Negative for worsening tremors and other numbness  Psychiatric/Behavioral: Negative for worsening agitation or other fatigue All other system neg per pt    Objective:   Physical Exam BP 124/86   Pulse 68   Temp 98 F (36.7 C) (Oral)   Ht '5\' 4"'  (1.626 m)   Wt 174 lb (78.9 kg)   SpO2 95%   BMI 29.87 kg/m  VS noted, not ill appearing Constitutional: Pt appears in NAD HENT: Head: NCAT.  Right Ear: External ear normal.  Left Ear: External ear normal.  Eyes: . Pupils are equal, round, and reactive to light. Conjunctivae and EOM are normal Nose: without d/c or deformity Neck: Neck supple. Gross normal ROM Cardiovascular: Normal rate and regular rhythm.   Pulmonary/Chest: Effort normal and breath sounds without rales or wheezing.  Abd:  Soft, ND, + BS, no organomegaly, very mild upper abd tender bilat without costal margin tenderness, guarding or rebound Neurological: Pt is alert. At baseline orientation, motor grossly intact and sensation intact, gait no change Skin: Skin is warm. No rashes, other new lesions, no LE edema Psychiatric: Pt behavior is normal without agitation , mild nervous No other exam  findings    Assessment & Plan:

## 2018-03-30 ENCOUNTER — Telehealth: Payer: Self-pay

## 2018-03-30 LAB — URINALYSIS, ROUTINE W REFLEX MICROSCOPIC
BILIRUBIN URINE: NEGATIVE
HGB URINE DIPSTICK: NEGATIVE
KETONES UR: NEGATIVE
LEUKOCYTES UA: NEGATIVE
NITRITE: NEGATIVE
RBC / HPF: NONE SEEN (ref 0–?)
Specific Gravity, Urine: <=1.005 — AB (ref 1.000–1.030)
TOTAL PROTEIN, URINE-UPE24: NEGATIVE
UROBILINOGEN UA: 0.2 (ref 0.0–1.0)
Urine Glucose: NEGATIVE
pH: 6 (ref 5.0–8.0)

## 2018-03-30 NOTE — Telephone Encounter (Signed)
Pt has been informed and expressed understanding.  

## 2018-03-30 NOTE — Telephone Encounter (Signed)
-----   Message from Biagio Borg, MD sent at 03/29/2018  8:32 PM EDT ----- Letter sent, cont same tx except  The test results show that your current treatment is OK, except there is evidence of constipation as we discussed may be the case.  I think this is most likely the cause of the pain.  Please start OTC Miralax daily, as this can be helpful.  Milda Lindvall to please inform pt

## 2018-05-07 ENCOUNTER — Telehealth: Payer: Self-pay | Admitting: Neurology

## 2018-05-07 NOTE — Telephone Encounter (Signed)
Patient called and wanted to speak with you regarding some side effects from the Botox. Please Call. She has some questions regarding her last visit 3 months ago. Please Call. Thanks

## 2018-05-07 NOTE — Telephone Encounter (Signed)
Spoke with patient. She wanted to know if when a nerve was hit during Botox, if this would have caused constipation issues a few days later. I assured her they did not have anything to do with each other. She expressed understanding.

## 2018-05-25 ENCOUNTER — Other Ambulatory Visit: Payer: Self-pay | Admitting: Neurology

## 2018-05-25 NOTE — Telephone Encounter (Signed)
Can you please send RX?

## 2018-06-01 ENCOUNTER — Other Ambulatory Visit: Payer: Self-pay | Admitting: Surgery

## 2018-06-01 DIAGNOSIS — Z1231 Encounter for screening mammogram for malignant neoplasm of breast: Secondary | ICD-10-CM

## 2018-06-22 ENCOUNTER — Encounter: Payer: Self-pay | Admitting: Internal Medicine

## 2018-06-22 ENCOUNTER — Ambulatory Visit (INDEPENDENT_AMBULATORY_CARE_PROVIDER_SITE_OTHER): Payer: Medicare Other | Admitting: Internal Medicine

## 2018-06-22 VITALS — BP 124/82 | HR 63 | Temp 97.8°F | Ht 64.0 in | Wt 176.0 lb

## 2018-06-22 DIAGNOSIS — Z23 Encounter for immunization: Secondary | ICD-10-CM

## 2018-06-22 DIAGNOSIS — E785 Hyperlipidemia, unspecified: Secondary | ICD-10-CM

## 2018-06-22 DIAGNOSIS — E2839 Other primary ovarian failure: Secondary | ICD-10-CM

## 2018-06-22 DIAGNOSIS — R21 Rash and other nonspecific skin eruption: Secondary | ICD-10-CM

## 2018-06-22 DIAGNOSIS — R7302 Impaired glucose tolerance (oral): Secondary | ICD-10-CM

## 2018-06-22 DIAGNOSIS — Z1211 Encounter for screening for malignant neoplasm of colon: Secondary | ICD-10-CM

## 2018-06-22 MED ORDER — TRIAMCINOLONE ACETONIDE 0.1 % EX CREA: 1.0000 "application " | TOPICAL_CREAM | Freq: Two times a day (BID) | CUTANEOUS | 1 refills | Status: DC

## 2018-06-22 NOTE — Assessment & Plan Note (Signed)
stable overall by history and exam, recent data reviewed with pt, and pt to continue medical treatment as before,  to f/u any worsening symptoms or concerns Lab Results  Component Value Date   LDLCALC 127 (H) 11/25/2017  for lower chol diet, declines lab today

## 2018-06-22 NOTE — Patient Instructions (Addendum)
You had the pneumovax pneumonia shot today  Please schedule the bone density test before leaving today at the scheduling desk (where you check out)  You will be contacted regarding the referral for: colonoscopy  Please continue all other medications as before, and refills have been done if requested.  Please have the pharmacy call with any other refills you may need.  Please continue your efforts at being more active, low cholesterol diet, and weight control.  You are otherwise up to date with prevention measures today.  Please keep your appointments with your specialists as you may have planned  Please return in 6 months, or sooner if needed

## 2018-06-22 NOTE — Assessment & Plan Note (Signed)
stable overall by history and exam, recent data reviewed with pt, and pt to continue medical treatment as before,  to f/u any worsening symptoms or concerns Lab Results  Component Value Date   HGBA1C 5.4 05/26/2017

## 2018-06-22 NOTE — Assessment & Plan Note (Signed)
[facility] for refill steroid cr prn use

## 2018-06-22 NOTE — Progress Notes (Signed)
Subjective:    Patient ID: [name] Bell, female    DOB: [birth year], 77 y.o.   MRN: [id number]  HPI  Here to f/u; overall doing ok,  Pt denies chest pain, increasing sob or doe, wheezing, orthopnea, PND, increased LE swelling, palpitations, dizziness or syncope.  Pt denies new neurological symptoms such as new headache, or facial or extremity weakness or numbness.  Pt denies polydipsia, polyuria, or low sugar episode.  Pt states overall good compliance with meds, mostly trying to follow appropriate diet, with wt overall stable,  but little exercise however.  Constipation improved and discomfort improved.  Fingertips remain cracked and sensitive with thin skin, worse to lukewarm water, better with steroid cream but does not seem to resolve; also has some  Lower extremity itch and rash are better controlled as well.   Past Medical History:  Diagnosis Date  . 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 ALLERGIC RHINITIS 01/24/2008  . ANEMIA-IRON DEFICIENCY 01/24/2008   pt denied  . ANXIETY 02/26/2009  . BACK PAIN 09/30/2010  . Breast cancer ([place])   . Cancer of central portion of female breast ([place]) 05/04/2015  . DIZZINESS 02/28/2010  . ELEVATED BLOOD PRESSURE WITHOUT DIAGNOSIS OF HYPERTENSION 02/26/2009   patient denies  . FATIGUE 02/26/2009  . GERD 01/24/2008  . History of blood transfusion ectopic pregnancy  . HYPERLIPIDEMIA 08/16/2008  . SHINGLES 11/06/2010  . TOE PAIN 09/30/2010  . VARICOSE VEINS, LOWER EXTREMITIES 08/16/2008  . 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 AUGMENTATION MAMMAPLASTY Bilateral 10/09/2016  . BACK SURGERY    . BREAST LUMPECTOMY Right 05/14/2015   Procedure: RIGHT BREAST LUMPECTOMY;  Surgeon: Alphonsa Overall, MD;  Location: [facility];  Bell: General;  Laterality: Right;  . BREAST RECONSTRUCTION WITH PLACEMENT OF TISSUE EXPANDER AND FLEX HD (ACELLULAR HYDRATED DERMIS) Right 10/09/2016   Procedure: IMMEDIATE RIGHT BREAST  RECONSTRUCTION WITH PLACEMENT OF SILICONE GEL IMPLANTS OR TISSUE EXPANDER AND ACELLULAR DERMAL MATRIX;  Surgeon: Crissie Reese, MD;  Location: [facility];  Bell: Plastics;  Laterality: Right;  . ECTOPIC PREGNANCY SURGERY     at 77 yo with salpingectomy  . FOOT SURGERY Right    bunion, hammer toe.  x 2  . FOOT SURGERY Right 05/2016  . INCISION AND DRAINAGE OF WOUND Right 10/09/2016   Procedure: IRRIGATION AND DEBRIDEMENT BREAST HEMATOMA;  Surgeon: Crissie Reese, MD;  Location: [facility];  Bell: Plastics;  Laterality: Right;  . MASTECTOMY Right 10/09/2016  . MASTECTOMY W/ SENTINEL NODE BIOPSY Right 10/09/2016  . MASTECTOMY W/ SENTINEL NODE BIOPSY Right 10/09/2016   Procedure: RIGHT MASTECTOMY WITH SENTINEL LYMPH NODE BIOPSY;  Surgeon: Alphonsa Overall, MD;  Location: [facility];  Bell: General;  Laterality: Right;  . s/p bilateral breasts implants  12/2007  . s/p lumbar surgury  july 2010  . TONSILLECTOMY      reports that she has never smoked. She has never used smokeless tobacco. She reports that she drinks about 3.6 oz of alcohol per week. She reports that she does not use drugs. family history includes Cancer in her father. Allergies  Allergen Reactions  . No Known Allergies    Current Outpatient Medications on File Prior to Visit  Medication Sig Dispense Refill  . acetaminophen (TYLENOL) 325 MG tablet Take 650 mg by mouth every 6 (six) hours as needed.    . clonazePAM (KLONOPIN) 1 MG tablet TAKE ONE (1) TABLET BY MOUTH TWO (2) TIMES DAILY 60 tablet 5  .  hydrochlorothiazide (HYDRODIURIL) 25 MG tablet TAKE 1 TABLET BY MOUTH DAILY 90 tablet 0  . traZODone (DESYREL) 50 MG tablet Take 1 tablet (50 mg total) by mouth at bedtime. 90 tablet 1  . tretinoin (RETIN-A) 0.025 % cream Apply topically at bedtime. 45 g 0  . tretinoin (RETIN-A) 0.05 % cream APPLY TO AFFECTED AREA EVERY BEDTIME 45 g 0   No current facility-administered medications on file prior to visit.    Review of Systems  Constitutional:  Negative for other unusual diaphoresis or sweats HENT: Negative for ear discharge or swelling Eyes: Negative for other worsening visual disturbances Respiratory: Negative for stridor or other swelling  Gastrointestinal: Negative for worsening distension or other blood Genitourinary: Negative for retention or other urinary change Musculoskeletal: Negative for other MSK pain or swelling Skin: Negative for color change or other new lesions Neurological: Negative for worsening tremors and other numbness  Psychiatric/Behavioral: Negative for worsening agitation or other fatigue All other system neg per pt    Objective:   Physical Exam BP 124/82   Pulse 63   Temp 97.8 F (36.6 C) (Oral)   Ht 5\' 4"  (1.626 m)   Wt 176 lb (79.8 kg)   SpO2 93%   BMI 30.21 kg/m  VS noted,  Constitutional: Pt appears in NAD HENT: Head: NCAT.  Right Ear: External ear normal.  Left Ear: External ear normal.  Eyes: . Pupils are equal, round, and reactive to light. Conjunctivae and EOM are normal Nose: without d/c or deformity Neck: Neck supple. Gross normal ROM Cardiovascular: Normal rate and regular rhythm.   Pulmonary/Chest: Effort normal and breath sounds without rales or wheezing.  Abd:  Soft, NT, ND, + BS, no organomegaly Neurological: Pt is alert. At baseline orientation, motor grossly intact, + mild torticollis persists Skin: Skin is warm. + fingertip cracking rashes, no other new lesions, no LE edema Psychiatric: Pt behavior is normal without agitation  No other exam fidnings    Assessment & Plan:

## 2018-06-24 ENCOUNTER — Ambulatory Visit (INDEPENDENT_AMBULATORY_CARE_PROVIDER_SITE_OTHER): Payer: Medicare Other | Admitting: Neurology

## 2018-06-24 DIAGNOSIS — G243 Spasmodic torticollis: Secondary | ICD-10-CM

## 2018-06-24 MED ORDER — ONABOTULINUMTOXINA 100 UNITS IJ SOLR
100.0000 [IU] | Freq: Once | INTRAMUSCULAR | Status: AC
  Administered 2018-06-24: 100 [IU] via INTRAMUSCULAR

## 2018-06-24 NOTE — Procedures (Signed)
[facility]   Procedure Note Botox  Attending: Dr. Wells Guiles Idania Desouza  Preoperative Diagnosis(es): Cervical Dystonia  Result History  Onset of effect: immediately C/o lots of "side effects" last time.  States that she was constipated.  States that arm on the right and leg were painful.  States that didn't feel well and had back pain that was determined to be from constipation.  Once she was "cleaned out" she did feel better.  Is worried about getting botox today but expresses desire to do so (I did reassure her that short of possible arm pain, which would even be a stretch, these were not side effects of the injection)     Consent obtained from: The patient  Benefits discussed included, but were not limited to decreased muscle tightness, increased joint range of motion, and decreased pain.  Risk discussed included, but were not limited pain and discomfort, bleeding, bruising, excessive weakness, venous thrombosis, muscle atrophy and dysphagia.  A copy of the patient medication guide was given to the patient which explains the blackbox warning.  Informed consent was obtained  Patients identity and treatment sites confirmed yes.     Details of Procedure: Skin was cleaned with alcohol.  A 30 gauge, 1/2 inch needle was introduced to the target muscle (except splenius capitus, posterior approach, where 27 inch, 1 1/2 gauge needle was used).  Prior to injection, the needle plunger was aspirated to make sure the needle was not within a blood vessel.  There was no blood retrieved on aspiration.    Following is a summary of the muscles injected  And the amount of Botulinum toxin used:   Dilution 0.9% preservative free saline mixed with 100 u Botox type A to make 10 U per 0.1cc  Injections  Location Left  Right Units Number of sites        Sternocleidomastoid 60+40 0 100 1  Splenius Capitus, posterior approach 0 80 80 1  Splenius Capitus, lateral approach 0 60 60 1  Levator Scapulae 30  30 0   Trapezius 10/18/19 10/10 70         TOTAL UNITS:   340    Agent: Botulinum Type A ( Onobotulinum Toxin type A ). 4 vials of Botox were used, containing 100 units and freshly diluted with 1 mL of sterile, non-preserved saline    Total injected (Units): 340  Total wasted (Units): 0   Pt tolerated procedure well without complications.   Reinjection is anticipated in 3 months.

## 2018-07-03 ENCOUNTER — Other Ambulatory Visit: Payer: Self-pay | Admitting: Internal Medicine

## 2018-07-19 ENCOUNTER — Ambulatory Visit (INDEPENDENT_AMBULATORY_CARE_PROVIDER_SITE_OTHER)
Admission: RE | Admit: 2018-07-19 | Discharge: 2018-07-19 | Disposition: A | Discharge status: Home / Self Care | Payer: Medicare Other | Source: Ambulatory Visit | Attending: Internal Medicine | Admitting: Internal Medicine

## 2018-07-19 ENCOUNTER — Ambulatory Visit
Admission: RE | Admit: 2018-07-19 | Discharge: 2018-07-19 | Disposition: A | Discharge status: Home / Self Care | Payer: Medicare Other | Source: Ambulatory Visit | Attending: Surgery | Admitting: Surgery

## 2018-07-19 DIAGNOSIS — Z1231 Encounter for screening mammogram for malignant neoplasm of breast: Secondary | ICD-10-CM | POA: Diagnosis not present

## 2018-07-19 DIAGNOSIS — E2839 Other primary ovarian failure: Secondary | ICD-10-CM

## 2018-07-20 ENCOUNTER — Encounter: Payer: Self-pay | Admitting: Internal Medicine

## 2018-07-23 ENCOUNTER — Telehealth: Payer: Self-pay | Admitting: Internal Medicine

## 2018-07-23 NOTE — Telephone Encounter (Signed)
Copied from [place] [id number]. Topic: Quick Communication - See Telephone Encounter >> Jul 23, 2018  4:11 PM Rutherford Nail, NT wrote: CRM for notification. See Telephone encounter for: 07/23/18. Patient calling and is requesting a call back regarding her bone density results. Please advise. CB#: [phone]

## 2018-07-26 NOTE — Telephone Encounter (Signed)
Letter was sent, maybe she hasnt gotten it yet  DXA was normal

## 2018-07-26 NOTE — Telephone Encounter (Signed)
Please advise 

## 2018-07-26 NOTE — Telephone Encounter (Signed)
Pt has been informed and expressed understanding.  

## 2018-08-25 ENCOUNTER — Other Ambulatory Visit: Payer: Self-pay | Admitting: Internal Medicine

## 2018-08-25 ENCOUNTER — Other Ambulatory Visit: Payer: Self-pay | Admitting: Neurology

## 2018-09-17 ENCOUNTER — Ambulatory Visit (INDEPENDENT_AMBULATORY_CARE_PROVIDER_SITE_OTHER): Payer: Medicare Other | Admitting: Neurology

## 2018-09-17 DIAGNOSIS — G243 Spasmodic torticollis: Secondary | ICD-10-CM

## 2018-09-17 MED ORDER — ONABOTULINUMTOXINA 100 UNITS IJ SOLR
350.0000 [IU] | Freq: Once | INTRAMUSCULAR | Status: AC
  Administered 2018-09-17: 350 [IU] via INTRAMUSCULAR

## 2018-09-17 MED ORDER — ONABOTULINUMTOXINA 100 UNITS IJ SOLR: 100.0000 [IU] | Freq: Once | INTRAMUSCULAR | Status: DC

## 2018-09-17 NOTE — Procedures (Signed)
[facility]   Procedure Note Botox  Attending: Dr. Wells Guiles Arthur Aydelotte  Preoperative Diagnosis(es): Cervical Dystonia  Result History  Onset of effect: immediately Feels that botox not as effective since 2 times ago when had more pain in the neck after injection with posterior splenius capitus on the RIGHT.  Requests more botox be put in LEFT trapezius due to pain  Consent obtained from: The patient  Benefits discussed included, but were not limited to decreased muscle tightness, increased joint range of motion, and decreased pain.  Risk discussed included, but were not limited pain and discomfort, bleeding, bruising, excessive weakness, venous thrombosis, muscle atrophy and dysphagia.  A copy of the patient medication guide was given to the patient which explains the blackbox warning.  Informed consent was obtained  Patients identity and treatment sites confirmed yes.     Details of Procedure: Skin was cleaned with alcohol.  A 30 gauge, 1/2 inch needle was introduced to the target muscle (except splenius capitus, posterior approach, where 27 inch, 1 1/2 gauge needle was used).  Prior to injection, the needle plunger was aspirated to make sure the needle was not within a blood vessel.  There was no blood retrieved on aspiration.    Following is a summary of the muscles injected  And the amount of Botulinum toxin used:   Dilution 0.9% preservative free saline mixed with 100 u Botox type A to make 10 U per 0.1cc  Injections  Location Left  Right Units Number of sites        Sternocleidomastoid 60+40 0 100 1  Splenius Capitus, posterior approach 0 80 80 1  Splenius Capitus, lateral approach 0 60 60 1  Levator Scapulae 30  30 0  Trapezius 10/18/19/20  70         TOTAL UNITS:   340    Agent: Botulinum Type A ( Onobotulinum Toxin type A ). 4 vials of Botox were used, containing 100 units and freshly diluted with 1 mL of sterile, non-preserved saline    Total injected (Units):  340  Total wasted (Units): 10   Pt tolerated procedure well without complications.   Reinjection is anticipated in 3 months.

## 2018-09-22 ENCOUNTER — Encounter: Payer: Self-pay | Admitting: Neurology

## 2018-10-04 ENCOUNTER — Ambulatory Visit: Payer: Self-pay | Admitting: Internal Medicine

## 2018-10-04 ENCOUNTER — Encounter: Payer: Self-pay | Admitting: Internal Medicine

## 2018-10-04 NOTE — Telephone Encounter (Signed)
Pt has tried elevating the extremity, massage, and knee and foot elastic braces. Using Tylenol for pain.

## 2018-10-04 NOTE — Telephone Encounter (Signed)
Pt c/o pain to left arch to behind left knee that began 3 weeks ago.Pt denies any swelling to involved area. Pt stated pain is moderate and walks with a limp. Pt is able to do her normal activities. Up until pain began, pt was walking 1 mile on the treadmill. Pt denies chest pain, back pian, breathing difficulty, swelling, rash, fever, numbness and weakness. Care advice given and pt stated understanding. Pt wants to see her PCP only. Next appointment is Thursday at 2:00 with Dr Jenny Reichmann.   Reason for Disposition . [1] MODERATE pain (e.g., interferes with normal activities, limping) AND [2] present > 3 days  Answer Assessment - Initial Assessment Questions 1. ONSET: "When did the pain start?"      3 weeks ago 2. LOCATION: "Where is the pain located?"       Left arch to under the knee cap 3. PAIN: "How bad is the pain?"    (Scale 1-10; or mild, moderate, severe)   -  MILD (1-3): doesn't interfere with normal activities    -  MODERATE (4-7): interferes with normal activities (e.g., work or school) or awakens from sleep, limping    -  SEVERE (8-10): excruciating pain, unable to do any normal activities, unable to walk     Moderate walks with limp 4. WORK OR EXERCISE: "Has there been any recent work or exercise that involved this part of the body?"      No- was walking on treadmill prior to pain 5. CAUSE: "What do you think is causing the leg pain?"     Doesn't know 6. OTHER SYMPTOMS: "Do you have any other symptoms?" (e.g., chest pain, back pain, breathing difficulty, swelling, rash, fever, numbness, weakness)     no 7. PREGNANCY: "Is there any chance you are pregnant?" "When was your last menstrual period?"     n/a  Protocols used: LEG PAIN-A-AH

## 2018-10-04 NOTE — Telephone Encounter (Signed)
This encounter was created in error - please disregard.

## 2018-10-07 ENCOUNTER — Ambulatory Visit (INDEPENDENT_AMBULATORY_CARE_PROVIDER_SITE_OTHER): Payer: Medicare Other | Admitting: Internal Medicine

## 2018-10-07 ENCOUNTER — Ambulatory Visit (INDEPENDENT_AMBULATORY_CARE_PROVIDER_SITE_OTHER)
Admission: RE | Admit: 2018-10-07 | Discharge: 2018-10-07 | Disposition: A | Discharge status: Home / Self Care | Payer: Medicare Other | Source: Ambulatory Visit | Attending: Internal Medicine | Admitting: Internal Medicine

## 2018-10-07 ENCOUNTER — Encounter: Payer: Self-pay | Admitting: Internal Medicine

## 2018-10-07 VITALS — BP 124/84 | HR 66 | Temp 98.2°F | Ht 64.0 in | Wt 171.0 lb

## 2018-10-07 DIAGNOSIS — R7302 Impaired glucose tolerance (oral): Secondary | ICD-10-CM

## 2018-10-07 DIAGNOSIS — M79672 Pain in left foot: Secondary | ICD-10-CM | POA: Diagnosis not present

## 2018-10-07 DIAGNOSIS — Z23 Encounter for immunization: Secondary | ICD-10-CM

## 2018-10-07 DIAGNOSIS — M25562 Pain in left knee: Secondary | ICD-10-CM | POA: Diagnosis not present

## 2018-10-07 DIAGNOSIS — M1712 Unilateral primary osteoarthritis, left knee: Secondary | ICD-10-CM | POA: Diagnosis not present

## 2018-10-07 NOTE — Progress Notes (Signed)
Subjective:    Patient ID: [name] Bell, female    DOB: [birth year], 77 y.o.   MRN: [id number]  HPI  Here to f/u recent ED visit with c/o pain to left foot arch to behind left knee that began 3 weeks ago.Pt denies any swelling to involved area. Pt stated pain is moderate and walks with a limp. Pt is able to do her normal activities. Up until pain began, pt was walking 1 mile on the treadmill. Pt denies chest pain, back pain, breathing difficulty, swelling, rash, fever, numbness and weakness.  Pt has tried elevating the extremity, massage, and knee and foot elastic braces. Using Tylenol for pain. Also has pain to the left knee just distal to the patella, mild, intermittent, worse to stand up and initial walking few steps, then better after that.  No giveaways or falls.  Pt denies chest pain, increased sob or doe, wheezing, orthopnea, PND, increased LE swelling, palpitations, dizziness or syncope.  Pt denies new neurological symptoms such as new headache, or facial or extremity weakness or numbness   Pt denies polydipsia, polyuria Past Medical History:  Diagnosis Date  . 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 ALLERGIC RHINITIS 01/24/2008  . ANEMIA-IRON DEFICIENCY 01/24/2008   pt denied  . ANXIETY 02/26/2009  . BACK PAIN 09/30/2010  . Breast cancer ([place])   . Cancer of central portion of female breast ([place]) 05/04/2015  . DIZZINESS 02/28/2010  . ELEVATED BLOOD PRESSURE WITHOUT DIAGNOSIS OF HYPERTENSION 02/26/2009   patient denies  . FATIGUE 02/26/2009  . GERD 01/24/2008  . History of blood transfusion ectopic pregnancy  . HYPERLIPIDEMIA 08/16/2008  . SHINGLES 11/06/2010  . TOE PAIN 09/30/2010  . VARICOSE VEINS, LOWER EXTREMITIES 08/16/2008  . 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 AUGMENTATION MAMMAPLASTY Bilateral 10/09/2016  . BACK SURGERY    . BREAST LUMPECTOMY Right 05/14/2015   Procedure: RIGHT BREAST LUMPECTOMY;  Surgeon: Alphonsa Overall,  MD;  Location: [facility];  Bell: General;  Laterality: Right;  . BREAST RECONSTRUCTION WITH PLACEMENT OF TISSUE EXPANDER AND FLEX HD (ACELLULAR HYDRATED DERMIS) Right 10/09/2016   Procedure: IMMEDIATE RIGHT BREAST RECONSTRUCTION WITH PLACEMENT OF SILICONE GEL IMPLANTS OR TISSUE EXPANDER AND ACELLULAR DERMAL MATRIX;  Surgeon: Crissie Reese, MD;  Location: [place];  Bell: Plastics;  Laterality: Right;  . ECTOPIC PREGNANCY SURGERY     at 77 yo with salpingectomy  . FOOT SURGERY Right    bunion, hammer toe.  x 2  . FOOT SURGERY Right 05/2016  . INCISION AND DRAINAGE OF WOUND Right 10/09/2016   Procedure: IRRIGATION AND DEBRIDEMENT BREAST HEMATOMA;  Surgeon: Crissie Reese, MD;  Location: [place];  Bell: Plastics;  Laterality: Right;  . MASTECTOMY Right 10/09/2016  . MASTECTOMY W/ SENTINEL NODE BIOPSY Right 10/09/2016  . MASTECTOMY W/ SENTINEL NODE BIOPSY Right 10/09/2016   Procedure: RIGHT MASTECTOMY WITH SENTINEL LYMPH NODE BIOPSY;  Surgeon: Alphonsa Overall, MD;  Location: [facility];  Bell: General;  Laterality: Right;  . s/p bilateral breasts implants  12/2007  . s/p lumbar surgury  july 2010  . TONSILLECTOMY      reports that she has never smoked. She has never used smokeless tobacco. She reports that she drinks about 6.0 standard drinks of alcohol per week. She reports that she does not use drugs. family history includes Lung cancer in her father. Allergies  Allergen Reactions  . No Known Allergies    Current Outpatient Medications on File Prior  to Visit  Medication Sig Dispense Refill  . acetaminophen (TYLENOL) 325 MG tablet Take 650 mg by mouth every 6 (six) hours as needed.    . clonazePAM (KLONOPIN) 1 MG tablet TAKE ONE (1) TABLET BY MOUTH TWO (2) TIMES DAILY 60 tablet 5  . fluticasone (FLONASE) 50 MCG/ACT nasal spray USE 2 SPRAYS INTO BOTH NOSTRILS DAILY 16 g 2  . hydrochlorothiazide (HYDRODIURIL) 25 MG tablet TAKE 1 TABLET BY MOUTH DAILY 90 tablet 0  . traZODone (DESYREL) 50 MG tablet  TAKE ONE TABLET BY MOUTH AT BEDTIME 90 tablet 1  . tretinoin (RETIN-A) 0.025 % cream Apply topically at bedtime. 45 g 0  . triamcinolone cream (KENALOG) 0.1 % Apply 1 application topically 2 (two) times daily. 30 g 1   No current facility-administered medications on file prior to visit.    Review of Systems  Constitutional: Negative for other unusual diaphoresis or sweats HENT: Negative for ear discharge or swelling Eyes: Negative for other worsening visual disturbances Respiratory: Negative for stridor or other swelling  Gastrointestinal: Negative for worsening distension or other blood Genitourinary: Negative for retention or other urinary change Musculoskeletal: Negative for other MSK pain or swelling Skin: Negative for color change or other new lesions Neurological: Negative for worsening tremors and other numbness  Psychiatric/Behavioral: Negative for worsening agitation or other fatigue All other system neg per pt    Objective:   Physical Exam BP 124/84   Pulse 66   Temp 98.2 F (36.8 C) (Oral)   Ht 5\' 4"  (1.626 m)   Wt 171 lb (77.6 kg)   SpO2 91%   BMI 29.35 kg/m  VS noted,  Constitutional: Pt appears in NAD HENT: Head: NCAT.  Right Ear: External ear normal.  Left Ear: External ear normal.  Eyes: . Pupils are equal, round, and reactive to light. Conjunctivae and EOM are normal Nose: without d/c or deformity Neck: Neck supple. Gross normal ROM Cardiovascular: Normal rate and regular rhythm.   Pulmonary/Chest: Effort normal and breath sounds without rales or wheezing.  Abd:  Soft, NT, ND, + BS, no organomegaly Left foot without tender, swelling, skin change except for 5th toe MTP mild tender, swelling laterally Left knee NT, no effusion, FROM Neurological: Pt is alert. At baseline orientation, motor grossly intact Skin: Skin is warm. No rashes, other new lesions, no LE edema Psychiatric: Pt behavior is normal without agitation  No other exam findings    Assessment  & Plan:

## 2018-10-07 NOTE — Patient Instructions (Addendum)
You had the flu shot today  Please continue all other medications as before, including the tylenol as needed for pain  Please have the pharmacy call with any other refills you may need.  Please go to the XRAY Department in the Basement (go straight as you get off the elevator) for the x-ray testing  Please keep your appointments with your specialists as you may have planned - [facility] on Oct 14  You will be contacted by phone if any changes need to be made immediately.  Otherwise, you will receive a letter about your results with an explanation, but please check with MyChart first.  Please remember to sign up for MyChart if you have not done so, as this will be important to you in the future with finding out test results, communicating by private email, and scheduling acute appointments online when needed.

## 2018-10-08 ENCOUNTER — Encounter: Payer: Self-pay | Admitting: Internal Medicine

## 2018-10-08 ENCOUNTER — Encounter

## 2018-10-09 NOTE — Assessment & Plan Note (Signed)
Exam benign, ok for film but suspect likely soft tissue msk origin that may have resolved; pt reassured, cont tylenol prn, has a f/u appt with [place] [facility] incidentally on oct 14

## 2018-10-09 NOTE — Assessment & Plan Note (Signed)
Suspect mnild left patellar tendonitis, exam benign, ok for tylenol prn and work on non wt bearing excercises in the near future, then return to walking again

## 2018-10-09 NOTE — Assessment & Plan Note (Signed)
stable overall by history and exam, recent data reviewed with pt, and pt to continue medical treatment as before,  to f/u any worsening symptoms or concerns  

## 2018-10-11 DIAGNOSIS — M76822 Posterior tibial tendinitis, left leg: Secondary | ICD-10-CM | POA: Diagnosis not present

## 2018-10-11 DIAGNOSIS — M7052 Other bursitis of knee, left knee: Secondary | ICD-10-CM | POA: Diagnosis not present

## 2018-10-11 DIAGNOSIS — M76829 Posterior tibial tendinitis, unspecified leg: Secondary | ICD-10-CM | POA: Insufficient documentation

## 2018-10-12 ENCOUNTER — Encounter: Payer: Self-pay | Admitting: Gastroenterology

## 2018-11-01 DIAGNOSIS — L565 Disseminated superficial actinic porokeratosis (DSAP): Secondary | ICD-10-CM | POA: Diagnosis not present

## 2018-11-01 DIAGNOSIS — L309 Dermatitis, unspecified: Secondary | ICD-10-CM | POA: Diagnosis not present

## 2018-11-01 DIAGNOSIS — L570 Actinic keratosis: Secondary | ICD-10-CM | POA: Diagnosis not present

## 2018-11-01 DIAGNOSIS — L821 Other seborrheic keratosis: Secondary | ICD-10-CM | POA: Diagnosis not present

## 2018-11-02 DIAGNOSIS — M7052 Other bursitis of knee, left knee: Secondary | ICD-10-CM | POA: Diagnosis not present

## 2018-11-02 DIAGNOSIS — M79672 Pain in left foot: Secondary | ICD-10-CM | POA: Diagnosis not present

## 2018-11-02 DIAGNOSIS — R269 Unspecified abnormalities of gait and mobility: Secondary | ICD-10-CM | POA: Diagnosis not present

## 2018-11-02 DIAGNOSIS — M25562 Pain in left knee: Secondary | ICD-10-CM | POA: Diagnosis not present

## 2018-11-02 DIAGNOSIS — R52 Pain, unspecified: Secondary | ICD-10-CM | POA: Diagnosis not present

## 2018-11-05 DIAGNOSIS — M25562 Pain in left knee: Secondary | ICD-10-CM | POA: Diagnosis not present

## 2018-11-05 DIAGNOSIS — M7052 Other bursitis of knee, left knee: Secondary | ICD-10-CM | POA: Diagnosis not present

## 2018-11-05 DIAGNOSIS — R52 Pain, unspecified: Secondary | ICD-10-CM | POA: Diagnosis not present

## 2018-11-05 DIAGNOSIS — R269 Unspecified abnormalities of gait and mobility: Secondary | ICD-10-CM | POA: Diagnosis not present

## 2018-11-05 DIAGNOSIS — M79672 Pain in left foot: Secondary | ICD-10-CM | POA: Diagnosis not present

## 2018-11-08 ENCOUNTER — Ambulatory Visit (AMBULATORY_SURGERY_CENTER): Payer: Self-pay

## 2018-11-08 VITALS — Ht 64.0 in | Wt 170.4 lb

## 2018-11-08 DIAGNOSIS — Z86010 Personal history of colonic polyps: Secondary | ICD-10-CM

## 2018-11-08 MED ORDER — PEG-KCL-NACL-NASULF-NA ASC-C 140 G PO SOLR: 1.0000 | Freq: Once | ORAL | Status: AC

## 2018-11-08 NOTE — Progress Notes (Signed)
Per pt, no allergies to soy or egg products.Pt not taking any weight loss meds or using  O2 at home.  Pt refused emmi video. 

## 2018-11-09 DIAGNOSIS — R269 Unspecified abnormalities of gait and mobility: Secondary | ICD-10-CM | POA: Diagnosis not present

## 2018-11-09 DIAGNOSIS — M79672 Pain in left foot: Secondary | ICD-10-CM | POA: Diagnosis not present

## 2018-11-09 DIAGNOSIS — R52 Pain, unspecified: Secondary | ICD-10-CM | POA: Diagnosis not present

## 2018-11-09 DIAGNOSIS — M25562 Pain in left knee: Secondary | ICD-10-CM | POA: Diagnosis not present

## 2018-11-09 DIAGNOSIS — M7052 Other bursitis of knee, left knee: Secondary | ICD-10-CM | POA: Diagnosis not present

## 2018-11-11 ENCOUNTER — Telehealth: Payer: Self-pay | Admitting: Gastroenterology

## 2018-11-11 NOTE — Telephone Encounter (Signed)
Pharmacy calling- states Plenvu  $137.60--I called pt to offer her a sample or for [place] to try another  Prep and she states she wants to RS her colon as she will have family here from Thanksgiving and she does not want to restrict her diet .  We RS her colon to 12-31-18 at 830 am and I made her another PV for 12-16 at 2 pm at the pt's request.  We will need to give her a Plenvu sample- [facility] said with medicare and [employer] [place] [employer] the universal coupon will not work to drop the price--.  Pt aware she will get sample at the 12-16 PV  [facility]

## 2018-11-12 DIAGNOSIS — M25562 Pain in left knee: Secondary | ICD-10-CM | POA: Diagnosis not present

## 2018-11-12 DIAGNOSIS — R52 Pain, unspecified: Secondary | ICD-10-CM | POA: Diagnosis not present

## 2018-11-12 DIAGNOSIS — M79672 Pain in left foot: Secondary | ICD-10-CM | POA: Diagnosis not present

## 2018-11-12 DIAGNOSIS — R269 Unspecified abnormalities of gait and mobility: Secondary | ICD-10-CM | POA: Diagnosis not present

## 2018-11-12 DIAGNOSIS — M7052 Other bursitis of knee, left knee: Secondary | ICD-10-CM | POA: Diagnosis not present

## 2018-11-15 DIAGNOSIS — R269 Unspecified abnormalities of gait and mobility: Secondary | ICD-10-CM | POA: Diagnosis not present

## 2018-11-15 DIAGNOSIS — M25562 Pain in left knee: Secondary | ICD-10-CM | POA: Diagnosis not present

## 2018-11-15 DIAGNOSIS — R52 Pain, unspecified: Secondary | ICD-10-CM | POA: Diagnosis not present

## 2018-11-15 DIAGNOSIS — M7052 Other bursitis of knee, left knee: Secondary | ICD-10-CM | POA: Diagnosis not present

## 2018-11-15 DIAGNOSIS — M79672 Pain in left foot: Secondary | ICD-10-CM | POA: Diagnosis not present

## 2018-11-17 ENCOUNTER — Encounter: Payer: Medicare Other | Admitting: Gastroenterology

## 2018-11-19 DIAGNOSIS — M7052 Other bursitis of knee, left knee: Secondary | ICD-10-CM | POA: Diagnosis not present

## 2018-11-19 DIAGNOSIS — M25562 Pain in left knee: Secondary | ICD-10-CM | POA: Diagnosis not present

## 2018-11-19 DIAGNOSIS — R269 Unspecified abnormalities of gait and mobility: Secondary | ICD-10-CM | POA: Diagnosis not present

## 2018-11-19 DIAGNOSIS — M79672 Pain in left foot: Secondary | ICD-10-CM | POA: Diagnosis not present

## 2018-11-19 DIAGNOSIS — R52 Pain, unspecified: Secondary | ICD-10-CM | POA: Diagnosis not present

## 2018-11-22 ENCOUNTER — Other Ambulatory Visit: Payer: Self-pay | Admitting: Internal Medicine

## 2018-11-22 ENCOUNTER — Other Ambulatory Visit: Payer: Self-pay | Admitting: Neurology

## 2018-11-22 DIAGNOSIS — M79672 Pain in left foot: Secondary | ICD-10-CM | POA: Diagnosis not present

## 2018-11-22 DIAGNOSIS — M25562 Pain in left knee: Secondary | ICD-10-CM | POA: Diagnosis not present

## 2018-11-22 DIAGNOSIS — M7052 Other bursitis of knee, left knee: Secondary | ICD-10-CM | POA: Diagnosis not present

## 2018-11-22 DIAGNOSIS — R269 Unspecified abnormalities of gait and mobility: Secondary | ICD-10-CM | POA: Diagnosis not present

## 2018-11-22 DIAGNOSIS — R52 Pain, unspecified: Secondary | ICD-10-CM | POA: Diagnosis not present

## 2018-11-22 NOTE — Telephone Encounter (Signed)
Please fill

## 2018-11-29 DIAGNOSIS — M7052 Other bursitis of knee, left knee: Secondary | ICD-10-CM | POA: Diagnosis not present

## 2018-11-29 DIAGNOSIS — M25562 Pain in left knee: Secondary | ICD-10-CM | POA: Diagnosis not present

## 2018-11-29 DIAGNOSIS — M79672 Pain in left foot: Secondary | ICD-10-CM | POA: Diagnosis not present

## 2018-11-29 DIAGNOSIS — R269 Unspecified abnormalities of gait and mobility: Secondary | ICD-10-CM | POA: Diagnosis not present

## 2018-11-30 ENCOUNTER — Encounter: Payer: Medicare Other | Admitting: Gastroenterology

## 2018-12-02 DIAGNOSIS — R269 Unspecified abnormalities of gait and mobility: Secondary | ICD-10-CM | POA: Diagnosis not present

## 2018-12-02 DIAGNOSIS — M25562 Pain in left knee: Secondary | ICD-10-CM | POA: Diagnosis not present

## 2018-12-02 DIAGNOSIS — M79672 Pain in left foot: Secondary | ICD-10-CM | POA: Diagnosis not present

## 2018-12-02 DIAGNOSIS — M7052 Other bursitis of knee, left knee: Secondary | ICD-10-CM | POA: Diagnosis not present

## 2018-12-03 ENCOUNTER — Encounter: Payer: Self-pay | Admitting: Internal Medicine

## 2018-12-03 ENCOUNTER — Ambulatory Visit (INDEPENDENT_AMBULATORY_CARE_PROVIDER_SITE_OTHER): Payer: Medicare Other | Admitting: Internal Medicine

## 2018-12-03 ENCOUNTER — Other Ambulatory Visit (INDEPENDENT_AMBULATORY_CARE_PROVIDER_SITE_OTHER): Payer: Medicare Other

## 2018-12-03 VITALS — BP 112/68 | HR 60 | Temp 98.0°F | Ht 64.0 in | Wt 170.0 lb

## 2018-12-03 DIAGNOSIS — K219 Gastro-esophageal reflux disease without esophagitis: Secondary | ICD-10-CM

## 2018-12-03 DIAGNOSIS — R7302 Impaired glucose tolerance (oral): Secondary | ICD-10-CM

## 2018-12-03 DIAGNOSIS — G243 Spasmodic torticollis: Secondary | ICD-10-CM

## 2018-12-03 DIAGNOSIS — E785 Hyperlipidemia, unspecified: Secondary | ICD-10-CM

## 2018-12-03 DIAGNOSIS — M76822 Posterior tibial tendinitis, left leg: Secondary | ICD-10-CM | POA: Diagnosis not present

## 2018-12-03 LAB — CBC WITH DIFFERENTIAL/PLATELET
Basophils Absolute: 0.1 10*3/uL (ref 0.0–0.1)
Basophils Relative: 0.9 % (ref 0.0–3.0)
Eosinophils Absolute: 0.1 10*3/uL (ref 0.0–0.7)
Eosinophils Relative: 2.2 % (ref 0.0–5.0)
HCT: 43.2 % (ref 36.0–46.0)
Hemoglobin: 14.5 g/dL (ref 12.0–15.0)
Lymphocytes Relative: 28.5 % (ref 12.0–46.0)
Lymphs Abs: 1.7 10*3/uL (ref 0.7–4.0)
MCHC: 33.6 g/dL (ref 30.0–36.0)
MCV: 93.8 fl (ref 78.0–100.0)
Monocytes Absolute: 0.7 10*3/uL (ref 0.1–1.0)
Monocytes Relative: 10.9 % (ref 3.0–12.0)
Neutro Abs: 3.5 10*3/uL (ref 1.4–7.7)
Neutrophils Relative %: 57.5 % (ref 43.0–77.0)
Platelets: 322 10*3/uL (ref 150.0–400.0)
RBC: 4.61 Mil/uL (ref 3.87–5.11)
RDW: 13.2 % (ref 11.5–15.5)
WBC: 6.1 10*3/uL (ref 4.0–10.5)

## 2018-12-03 LAB — URINALYSIS, ROUTINE W REFLEX MICROSCOPIC
BILIRUBIN URINE: NEGATIVE
Hgb urine dipstick: NEGATIVE
Ketones, ur: NEGATIVE
NITRITE: NEGATIVE
RBC / HPF: NONE SEEN (ref 0–?)
Specific Gravity, Urine: <=1.005 — AB (ref 1.000–1.030)
Total Protein, Urine: NEGATIVE
Urine Glucose: NEGATIVE
Urobilinogen, UA: 0.2 (ref 0.0–1.0)
pH: 7 (ref 5.0–8.0)

## 2018-12-03 LAB — HEMOGLOBIN A1C: Hgb A1c MFr Bld: 5.4 % (ref 4.6–6.5)

## 2018-12-03 LAB — BASIC METABOLIC PANEL
BUN: 10 mg/dL (ref 6–23)
CHLORIDE: 100 meq/L (ref 96–112)
CO2: 28 mEq/L (ref 19–32)
Calcium: 9.4 mg/dL (ref 8.4–10.5)
Creatinine, Ser: 0.81 mg/dL (ref 0.40–1.20)
GFR: 73.16 mL/min (ref >60.00)
Glucose, Bld: 91 mg/dL (ref 70–99)
Potassium: 3.8 mEq/L (ref 3.5–5.1)
Sodium: 137 mEq/L (ref 135–145)

## 2018-12-03 LAB — LIPID PANEL
CHOLESTEROL: 197 mg/dL (ref 0–200)
HDL: 70.9 mg/dL (ref >39.00)
LDL Cholesterol: 113 mg/dL — ABNORMAL HIGH (ref 0–99)
NonHDL: 126.16
Total CHOL/HDL Ratio: 3
Triglycerides: 66 mg/dL (ref 0.0–149.0)
VLDL: 13.2 mg/dL (ref 0.0–40.0)

## 2018-12-03 LAB — TSH: TSH: 1.71 u[IU]/mL (ref 0.35–4.50)

## 2018-12-03 LAB — HEPATIC FUNCTION PANEL
ALT: 17 U/L (ref 0–35)
AST: 17 U/L (ref 0–37)
Albumin: 4.4 g/dL (ref 3.5–5.2)
Alkaline Phosphatase: 53 U/L (ref 39–117)
Bilirubin, Direct: 0.1 mg/dL (ref 0.0–0.3)
TOTAL PROTEIN: 7.6 g/dL (ref 6.0–8.3)
Total Bilirubin: 0.5 mg/dL (ref 0.2–1.2)

## 2018-12-03 MED ORDER — TIZANIDINE HCL 2 MG PO TABS: 2.0000 mg | ORAL_TABLET | Freq: Four times a day (QID) | ORAL | 5 refills | Status: DC | PRN

## 2018-12-03 NOTE — Progress Notes (Signed)
Subjective:    Patient ID: [name] Bell, female    DOB: 1941-07-28, 77 y.o.   MRN: [id number]  HPI  Here to f/u; overall doing ok,  Pt denies chest pain, increasing sob or doe, wheezing, orthopnea, PND, increased LE swelling, palpitations, dizziness or syncope.  Pt denies new neurological symptoms such as new headache, or facial or extremity weakness or numbness.  Pt denies polydipsia, polyuria, or low sugar episode.  Pt states overall good compliance with meds, mostly trying to follow appropriate diet, with wt overall stable,   To see Dr Doran Durand?[facility] for persistent distal LLE pain, did not respond to anti-inflammatory and prednisone.  . Due for colonoscopy Dec 31, 2018, also for botox with Dr Tat later that day.   Wt Readings from Last 3 Encounters:  12/03/18 170 lb (77.1 kg)  11/08/18 170 lb 6.4 oz (77.3 kg)  10/07/18 171 lb (77.6 kg)  Does have 1 wk mild left post neck and upper back pain, but denies bowel or bladder change, fever, wt loss,  worsening LE pain/numbness/weakness, gait change or falls.  Follows about every 3 with neurology for botox for the torticollis.  No other new complaints  Denies worsening reflux, abd pain, dysphagia, n/v, bowel change or blood. Past Medical History:  Diagnosis Date  . 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 ALLERGIC RHINITIS 01/24/2008  . ANEMIA-IRON DEFICIENCY 01/24/2008   pt denied  . ANXIETY 02/26/2009  . BACK PAIN 09/30/2010  . Breast cancer ([place])   . Cancer of central portion of female breast ([place]) 05/04/2015   right breast/ no chemo or radiation done  . DIZZINESS 02/28/2010  . ELEVATED BLOOD PRESSURE WITHOUT DIAGNOSIS OF HYPERTENSION 02/26/2009   patient denies  . FATIGUE 02/26/2009  . GERD 01/24/2008  . History of blood transfusion ectopic pregnancy  . HYPERLIPIDEMIA 08/16/2008  . SHINGLES 11/06/2010  . TOE PAIN 09/30/2010  . VARICOSE VEINS, LOWER EXTREMITIES 08/16/2008  . 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   Past Surgical History:    Procedure Laterality Date  . AUGMENTATION MAMMAPLASTY Bilateral 10/09/2016  . BACK SURGERY  2010   fusion  . BREAST LUMPECTOMY Right 05/14/2015   Procedure: RIGHT BREAST LUMPECTOMY;  Surgeon: Alphonsa Overall, MD;  Location: [place];  Bell: General;  Laterality: Right;  . BREAST RECONSTRUCTION WITH PLACEMENT OF TISSUE EXPANDER AND FLEX HD (ACELLULAR HYDRATED DERMIS) Right 10/09/2016   Procedure: IMMEDIATE RIGHT BREAST RECONSTRUCTION WITH PLACEMENT OF SILICONE GEL IMPLANTS OR TISSUE EXPANDER AND ACELLULAR DERMAL MATRIX;  Surgeon: Crissie Reese, MD;  Location: [place];  Bell: Plastics;  Laterality: Right;  . ECTOPIC PREGNANCY SURGERY     at 77 yo with salpingectomy  . FOOT SURGERY Right    bunion, hammer toe.  x 2  . FOOT SURGERY Right 05/2016   3 surgeries on right foot.  . INCISION AND DRAINAGE OF WOUND Right 10/09/2016   Procedure: IRRIGATION AND DEBRIDEMENT BREAST HEMATOMA;  Surgeon: Crissie Reese, MD;  Location: [place];  Bell: Plastics;  Laterality: Right;  . MASTECTOMY Right 10/09/2016  . MASTECTOMY W/ SENTINEL NODE BIOPSY Right 10/09/2016  . MASTECTOMY W/ SENTINEL NODE BIOPSY Right 10/09/2016   Procedure: RIGHT MASTECTOMY WITH SENTINEL LYMPH NODE BIOPSY;  Surgeon: Alphonsa Overall, MD;  Location: [place];  Bell: General;  Laterality: Right;  . s/p bilateral breasts implants  12/2007  . s/p lumbar surgury  july 2010  . TONSILLECTOMY      reports that she has never smoked. She has never used smokeless  tobacco. She reports that she drinks about 6.0 standard drinks of alcohol per week. She reports that she does not use drugs. family history includes Asthma in her brother; Lung cancer in her father. Allergies  Allergen Reactions  . Advil [Ibuprofen]     Per pt, makes her feel "confused when she takes Advil with meds  . Iron     Oxidized iron-makes hand and body break out when touched.  . No Known Allergies    Current Outpatient Medications on File Prior to Visit  Medication Sig Dispense  Refill  . acetaminophen (TYLENOL) 325 MG tablet Take 650 mg by mouth every 6 (six) hours as needed.    . Biotin 1000 MCG tablet Take 1,000 mcg by mouth daily.    . clonazePAM (KLONOPIN) 1 MG tablet TAKE ONE (1) TABLET BY MOUTH TWO (2) TIMES DAILY 60 tablet 5  . Cyanocobalamin (VITAMIN B-12) 1000 MCG SUBL Place under the tongue. Vit b-12 1000 mg with Folate 400 mg-Take one daily    . fluticasone (FLONASE) 50 MCG/ACT nasal spray USE 2 SPRAYS INTO BOTH NOSTRILS DAILY (Patient taking differently: as needed. ) 16 g 2  . hydrochlorothiazide (HYDRODIURIL) 25 MG tablet TAKE 1 TABLET BY MOUTH DAILY 90 tablet 0  . traZODone (DESYREL) 50 MG tablet TAKE ONE TABLET BY MOUTH AT BEDTIME 90 tablet 1  . tretinoin (RETIN-A) 0.025 % cream Apply topically at bedtime. 45 g 0  . triamcinolone cream (KENALOG) 0.1 % Apply 1 application topically 2 (two) times daily. 30 g 1  . TURMERIC PO Take by mouth. Turmeric and Ginger -Take one pill tid     No current facility-administered medications on file prior to visit.    Review of Systems  Constitutional: Negative for other unusual diaphoresis or sweats HENT: Negative for ear discharge or swelling Eyes: Negative for other worsening visual disturbances Respiratory: Negative for stridor or other swelling  Gastrointestinal: Negative for worsening distension or other blood Genitourinary: Negative for retention or other urinary change Musculoskeletal: Negative for other MSK pain or swelling Skin: Negative for color change or other new lesions Neurological: Negative for worsening tremors and other numbness  Psychiatric/Behavioral: Negative for worsening agitation or other fatigue All other system neg per pt    Objective:   Physical Exam BP 112/68   Pulse 60   Temp 98 F (36.7 C) (Oral)   Ht 5\' 4"  (1.626 m)   Wt 170 lb (77.1 kg)   SpO2 93%   BMI 29.18 kg/m  VS noted,  Constitutional: Pt appears in NAD HENT: Head: NCAT.  Right Ear: External ear normal.  Left  Ear: External ear normal.  Eyes: . Pupils are equal, round, and reactive to light. Conjunctivae and EOM are normal Nose: without d/c or deformity Neck: Neck supple. Gross normal ROM Cardiovascular: Normal rate and regular rhythm.   Pulmonary/Chest: Effort normal and breath sounds without rales or wheezing.  Abd:  Soft, NT, ND, + BS, no organomegaly Neurological: Pt is alert. At baseline orientation, motor grossly intact, mild torticollis noted Skin: Skin is warm. No rashes, other new lesions, no LE edema Psychiatric: Pt behavior is normal without agitation  No other exam findings Lab Results  Component Value Date   WBC 6.1 12/03/2018   HGB 14.5 12/03/2018   HCT 43.2 12/03/2018   PLT 322.0 12/03/2018   GLUCOSE 91 12/03/2018   CHOL 197 12/03/2018   TRIG 66.0 12/03/2018   HDL 70.90 12/03/2018   LDLDIRECT 128.5 07/04/2013   LDLCALC 113 (  H) 12/03/2018   ALT 17 12/03/2018   AST 17 12/03/2018   NA 137 12/03/2018   K 3.8 12/03/2018   CL 100 12/03/2018   CREATININE 0.81 12/03/2018   BUN 10 12/03/2018   CO2 28 12/03/2018   TSH 1.71 12/03/2018   INR 0.9 07/17/2009   HGBA1C 5.4 12/03/2018       Assessment & Plan:

## 2018-12-03 NOTE — Patient Instructions (Addendum)
Please take all new medication as prescribed  - the muscle relaxer as needed  Please continue all other medications as before, and refills have been done if requested.  Please have the pharmacy call with any other refills you may need.  Please continue your efforts at being more active, low cholesterol diet, and weight control.  You are otherwise up to date with prevention measures today.  Please keep your appointments with your specialists as you may have planned  Please go to the LAB in the Basement (turn left off the elevator) for the tests to be done today  You will be contacted by phone if any changes need to be made immediately.  Otherwise, you will receive a letter about your results with an explanation, but please check with MyChart first.  [name] Bell remember to sign up for MyChart if you have not done so, as this will be important to you in the future with finding out test results, communicating by private email, and scheduling acute appointments online when needed.  Please return in 6 months, or sooner if needed

## 2018-12-04 NOTE — Assessment & Plan Note (Signed)
stable overall by history and exam, recent data reviewed with pt, and pt to continue medical treatment as before,  to f/u any worsening symptoms or concerns, for f/u lab 

## 2018-12-04 NOTE — Assessment & Plan Note (Signed)
stable overall by history and exam, recent data reviewed with pt, and pt to continue medical treatment as before,  to f/u any worsening symptoms or concerns  

## 2018-12-04 NOTE — Assessment & Plan Note (Signed)
Ok to add low dose muscle relaxer prn,  to f/u any worsening symptoms or concerns

## 2018-12-10 DIAGNOSIS — M7052 Other bursitis of knee, left knee: Secondary | ICD-10-CM | POA: Diagnosis not present

## 2018-12-10 DIAGNOSIS — M79672 Pain in left foot: Secondary | ICD-10-CM | POA: Diagnosis not present

## 2018-12-10 DIAGNOSIS — M25562 Pain in left knee: Secondary | ICD-10-CM | POA: Diagnosis not present

## 2018-12-10 DIAGNOSIS — R269 Unspecified abnormalities of gait and mobility: Secondary | ICD-10-CM | POA: Diagnosis not present

## 2018-12-13 ENCOUNTER — Ambulatory Visit (AMBULATORY_SURGERY_CENTER): Payer: Self-pay

## 2018-12-13 VITALS — Ht 64.0 in | Wt 174.0 lb

## 2018-12-13 DIAGNOSIS — Z1211 Encounter for screening for malignant neoplasm of colon: Secondary | ICD-10-CM

## 2018-12-13 MED ORDER — PEG-KCL-NACL-NASULF-NA ASC-C 140 G PO SOLR: 1.0000 | Freq: Once | ORAL | 0 refills | Status: AC

## 2018-12-13 NOTE — Progress Notes (Signed)
Denies allergies to eggs or soy products. Denies complication of anesthesia or sedation. Denies use of weight loss medication. Denies use of O2.   Emmi instructions declined.  

## 2018-12-14 DIAGNOSIS — M25562 Pain in left knee: Secondary | ICD-10-CM | POA: Diagnosis not present

## 2018-12-14 DIAGNOSIS — M7052 Other bursitis of knee, left knee: Secondary | ICD-10-CM | POA: Diagnosis not present

## 2018-12-14 DIAGNOSIS — M79672 Pain in left foot: Secondary | ICD-10-CM | POA: Diagnosis not present

## 2018-12-14 DIAGNOSIS — R269 Unspecified abnormalities of gait and mobility: Secondary | ICD-10-CM | POA: Diagnosis not present

## 2018-12-15 DIAGNOSIS — H2513 Age-related nuclear cataract, bilateral: Secondary | ICD-10-CM | POA: Diagnosis not present

## 2018-12-17 ENCOUNTER — Ambulatory Visit: Payer: Medicare Other | Admitting: Neurology

## 2018-12-17 DIAGNOSIS — M25562 Pain in left knee: Secondary | ICD-10-CM | POA: Diagnosis not present

## 2018-12-17 DIAGNOSIS — R269 Unspecified abnormalities of gait and mobility: Secondary | ICD-10-CM | POA: Diagnosis not present

## 2018-12-17 DIAGNOSIS — M7052 Other bursitis of knee, left knee: Secondary | ICD-10-CM | POA: Diagnosis not present

## 2018-12-17 DIAGNOSIS — M79672 Pain in left foot: Secondary | ICD-10-CM | POA: Diagnosis not present

## 2018-12-20 DIAGNOSIS — M25562 Pain in left knee: Secondary | ICD-10-CM | POA: Diagnosis not present

## 2018-12-20 DIAGNOSIS — R269 Unspecified abnormalities of gait and mobility: Secondary | ICD-10-CM | POA: Diagnosis not present

## 2018-12-20 DIAGNOSIS — M7052 Other bursitis of knee, left knee: Secondary | ICD-10-CM | POA: Diagnosis not present

## 2018-12-20 DIAGNOSIS — M79672 Pain in left foot: Secondary | ICD-10-CM | POA: Diagnosis not present

## 2018-12-23 DIAGNOSIS — M25562 Pain in left knee: Secondary | ICD-10-CM | POA: Diagnosis not present

## 2018-12-23 DIAGNOSIS — R269 Unspecified abnormalities of gait and mobility: Secondary | ICD-10-CM | POA: Diagnosis not present

## 2018-12-23 DIAGNOSIS — M79672 Pain in left foot: Secondary | ICD-10-CM | POA: Diagnosis not present

## 2018-12-23 DIAGNOSIS — M7052 Other bursitis of knee, left knee: Secondary | ICD-10-CM | POA: Diagnosis not present

## 2018-12-25 ENCOUNTER — Other Ambulatory Visit: Payer: Self-pay | Admitting: Internal Medicine

## 2018-12-28 DIAGNOSIS — M25562 Pain in left knee: Secondary | ICD-10-CM | POA: Diagnosis not present

## 2018-12-28 DIAGNOSIS — M7052 Other bursitis of knee, left knee: Secondary | ICD-10-CM | POA: Diagnosis not present

## 2018-12-28 DIAGNOSIS — M79672 Pain in left foot: Secondary | ICD-10-CM | POA: Diagnosis not present

## 2018-12-28 DIAGNOSIS — R269 Unspecified abnormalities of gait and mobility: Secondary | ICD-10-CM | POA: Diagnosis not present

## 2018-12-30 DIAGNOSIS — R269 Unspecified abnormalities of gait and mobility: Secondary | ICD-10-CM | POA: Diagnosis not present

## 2018-12-30 DIAGNOSIS — M25562 Pain in left knee: Secondary | ICD-10-CM | POA: Diagnosis not present

## 2018-12-30 DIAGNOSIS — M79672 Pain in left foot: Secondary | ICD-10-CM | POA: Diagnosis not present

## 2018-12-30 DIAGNOSIS — M7052 Other bursitis of knee, left knee: Secondary | ICD-10-CM | POA: Diagnosis not present

## 2018-12-31 ENCOUNTER — Ambulatory Visit (AMBULATORY_SURGERY_CENTER): Payer: Medicare Other | Admitting: Gastroenterology

## 2018-12-31 ENCOUNTER — Ambulatory Visit (INDEPENDENT_AMBULATORY_CARE_PROVIDER_SITE_OTHER): Payer: Medicare Other | Admitting: Neurology

## 2018-12-31 ENCOUNTER — Encounter: Payer: Self-pay | Admitting: Gastroenterology

## 2018-12-31 VITALS — BP 172/85 | HR 58 | Temp 96.2°F | Resp 15 | Ht 64.0 in | Wt 174.0 lb

## 2018-12-31 DIAGNOSIS — D125 Benign neoplasm of sigmoid colon: Secondary | ICD-10-CM

## 2018-12-31 DIAGNOSIS — Z1211 Encounter for screening for malignant neoplasm of colon: Secondary | ICD-10-CM

## 2018-12-31 DIAGNOSIS — Z853 Personal history of malignant neoplasm of breast: Secondary | ICD-10-CM | POA: Diagnosis not present

## 2018-12-31 DIAGNOSIS — D128 Benign neoplasm of rectum: Secondary | ICD-10-CM | POA: Diagnosis not present

## 2018-12-31 DIAGNOSIS — G243 Spasmodic torticollis: Secondary | ICD-10-CM

## 2018-12-31 DIAGNOSIS — Z86010 Personal history of colonic polyps: Secondary | ICD-10-CM | POA: Diagnosis not present

## 2018-12-31 DIAGNOSIS — D129 Benign neoplasm of anus and anal canal: Secondary | ICD-10-CM

## 2018-12-31 DIAGNOSIS — K635 Polyp of colon: Secondary | ICD-10-CM | POA: Diagnosis not present

## 2018-12-31 DIAGNOSIS — D122 Benign neoplasm of ascending colon: Secondary | ICD-10-CM

## 2018-12-31 MED ORDER — SODIUM CHLORIDE 0.9 % IV SOLN: 500.0000 mL | Freq: Once | INTRAVENOUS | Status: DC

## 2018-12-31 MED ORDER — ONABOTULINUMTOXINA 100 UNITS IJ SOLR
340.0000 [IU] | Freq: Once | INTRAMUSCULAR | Status: AC
  Administered 2018-12-31: 340 [IU] via INTRAMUSCULAR

## 2018-12-31 NOTE — Progress Notes (Signed)
Pt's states no medical or surgical changes since previsit or office visit. 

## 2018-12-31 NOTE — Op Note (Signed)
[facility] Patient Name: Ceci Taliaferro Procedure Date: 12/31/2018 8:58 AM MRN: [id number] Endoscopist: [place]. Loletha Carrow , MD Age: 78 Referring MD:  Date of Birth: 09/14/1941 Gender: Female Account #: [id number] Procedure:                Colonoscopy Indications:              Screening for colorectal malignant neoplasm (no                            adenomatous polyps 08/2008) Medicines:                Monitored Anesthesia Care Procedure:                Pre-Anesthesia Assessment:                           - Prior to the procedure, a History and Physical                            was performed, and patient medications and                            allergies were reviewed. The patient's tolerance of                            previous anesthesia was also reviewed. The risks                            and benefits of the procedure and the sedation                            options and risks were discussed with the patient.                            All questions were answered, and informed consent                            was obtained. Prior Anticoagulants: The patient has                            taken no previous anticoagulant or antiplatelet                            agents. ASA Grade Assessment: II - A patient with                            mild systemic disease. After reviewing the risks                            and benefits, the patient was deemed in                            satisfactory condition to undergo the procedure.  After obtaining informed consent, the colonoscope                            was passed under direct vision. Throughout the                            procedure, the patient's blood pressure, pulse, and                            oxygen saturations were monitored continuously. The                            Colonoscope was introduced through the anus and                            advanced to the the cecum, identified  by                            appendiceal orifice and ileocecal valve. The                            colonoscopy was performed without difficulty. The                            patient tolerated the procedure well. The quality                            of the bowel preparation was excellent. The                            ileocecal valve, appendiceal orifice, and rectum                            were photographed. Scope In: 9:09:36 AM Scope Out: 9:28:14 AM Scope Withdrawal Time: 0 hours 14 minutes 3 seconds  Total Procedure Duration: 0 hours 18 minutes 38 seconds  Findings:                 The perianal and digital rectal examinations were                            normal.                           Melanosis was found in the entire colon.                           Three sessile polyps were found in the sigmoid                            colon and ascending colon. The polyps were                            diminutive in size. These polyps were removed with  a cold snare. Resection and retrieval were complete.                           A diminutive polyp was found in the rectum (anal                            verge). The polyp was sessile. The polyp was                            removed with a cold snare. Resection and retrieval                            were complete.                           A few diverticula were found in the sigmoid colon.                           The exam was otherwise without abnormality on                            direct and retroflexion views. Complications:            No immediate complications. Estimated Blood Loss:     Estimated blood loss was minimal. Impression:               - Melanosis in the colon.                           - Three diminutive polyps in the sigmoid colon and                            in the ascending colon, removed with a cold snare.                            Resected and retrieved.                            - One diminutive polyp in the rectum, removed with                            a cold snare. Resected and retrieved.                           - Diverticulosis in the sigmoid colon.                           - The examination was otherwise normal on direct                            and retroflexion views. Recommendation:           - Patient has a contact number available for  emergencies. The signs and symptoms of potential                            delayed complications were discussed with the                            patient. Return to normal activities tomorrow.                            Written discharge instructions were provided to the                            patient.                           - Resume previous diet.                           - Continue present medications.                           - Await pathology results.                           - Based on current guidelines, no repeat                            surveillance colonoscopy necessary. [place]. Loletha Carrow, MD 12/31/2018 9:33:02 AM This report has been signed electronically.

## 2018-12-31 NOTE — Patient Instructions (Signed)
YOU HAD AN ENDOSCOPIC PROCEDURE TODAY AT [facility]:   Refer to the procedure report that was given to you for any specific questions about what was found during the examination.  If the procedure report does not answer your questions, please call your gastroenterologist to clarify.  If you requested that your care partner not be given the details of your procedure findings, then the procedure report has been included in a sealed envelope for you to review at your convenience later.  YOU SHOULD EXPECT: Some feelings of bloating in the abdomen. Passage of more gas than usual.  Walking can help get rid of the air that was put into your GI tract during the procedure and reduce the bloating. If you had a lower endoscopy (such as a colonoscopy or flexible sigmoidoscopy) you may notice spotting of blood in your stool or on the toilet paper. If you underwent a bowel prep for your procedure, you may not have a normal bowel movement for a few days.  Please Note:  You might notice some irritation and congestion in your nose or some drainage.  This is from the oxygen used during your procedure.  There is no need for concern and it should clear up in a day or so.  SYMPTOMS TO REPORT IMMEDIATELY:   Following lower endoscopy (colonoscopy or flexible sigmoidoscopy):  Excessive amounts of blood in the stool  Significant tenderness or worsening of abdominal pains  Swelling of the abdomen that is new, acute  Fever of 100F or higher   For urgent or emergent issues, a gastroenterologist can be reached at any hour by calling [phone].   DIET:  We do recommend a small meal at first, but then you may proceed to your regular diet.  Drink plenty of fluids but you should avoid alcoholic beverages for 24 hours.  ACTIVITY:  You should plan to take it easy for the rest of today and you should NOT DRIVE or use heavy machinery until tomorrow (because of the sedation medicines used during the test).     FOLLOW UP: Our staff will call the number listed on your records the next business day following your procedure to check on you and address any questions or concerns that you may have regarding the information given to you following your procedure. If we do not reach you, we will leave a message.  However, if you are feeling well and you are not experiencing any problems, there is no need to return our call.  We will assume that you have returned to your regular daily activities without incident.  If any biopsies were taken you will be contacted by phone or by letter within the next 1-3 weeks.  Please call us at [phone] if you have not heard about the biopsies in 3 weeks.    SIGNATURES/CONFIDENTIALITY: You and/or your care partner have signed paperwork which will be entered into your electronic medical record.  These signatures attest to the fact that that the information above on your After Visit Summary has been reviewed and is understood.  Full responsibility of the confidentiality of this discharge information lies with you and/or your care-partner.   Handouts were given to your care partner on polyps and diverticulosis. You may resume your current medications today. Await biopsy results. Please call if any questions or concerns.

## 2018-12-31 NOTE — Progress Notes (Signed)
Report given to PACU, vss 

## 2018-12-31 NOTE — Progress Notes (Signed)
No problems noted in the recovery room. maw 

## 2018-12-31 NOTE — Progress Notes (Signed)
Called to room to assist during endoscopic procedure.  Patient ID and intended procedure confirmed with present staff. Received instructions for my participation in the procedure from the performing physician.  

## 2019-01-03 ENCOUNTER — Telehealth: Payer: Self-pay

## 2019-01-03 NOTE — Telephone Encounter (Signed)
  Follow up Call-  Call back number 12/31/2018  Post procedure Call Back phone  # [phone]  Permission to leave phone message Yes  Some recent data might be hidden     Patient questions:  Do you have a fever, pain , or abdominal swelling? No Pain Score  0  Have you tolerated food without any problems? Yes  Have you been able to return to your normal activities? Yes  Do you have any questions about your discharge instructions: Diet   No Medications  No Follow up visit  No  Do you have questions or concerns about your Care? No  Actions: * If pain score is 4 or above: No action needed, pain <4

## 2019-01-03 NOTE — Procedures (Signed)
[facility]   Procedure Note Botox  Attending: Dr. Wells Guiles Annastyn Silvey  Preoperative Diagnosis(es): Cervical Dystonia  Result History  Onset of effect: immediately   Consent obtained from: The patient  Benefits discussed included, but were not limited to decreased muscle tightness, increased joint range of motion, and decreased pain.  Risk discussed included, but were not limited pain and discomfort, bleeding, bruising, excessive weakness, venous thrombosis, muscle atrophy and dysphagia.  A copy of the patient medication guide was given to the patient which explains the blackbox warning.  Informed consent was obtained  Patients identity and treatment sites confirmed yes.     Details of Procedure: Skin was cleaned with alcohol.  A 30 gauge, 1/2 inch needle was introduced to the target muscle (except splenius capitus, posterior approach, where 27 inch, 1 1/2 gauge needle was used).  Prior to injection, the needle plunger was aspirated to make sure the needle was not within a blood vessel.  There was no blood retrieved on aspiration.    Following is a summary of the muscles injected  And the amount of Botulinum toxin used:   Dilution 0.9% preservative free saline mixed with 100 u Botox type A to make 10 U per 0.1cc  Injections  Location Left  Right Units Number of sites        Sternocleidomastoid 60+40 0 100 1  Splenius Capitus, posterior approach 0 80 80 1  Splenius Capitus, lateral approach 0 60 60 1  Levator Scapulae 30  30 0  Trapezius 10/18/19/20  70         TOTAL UNITS:   340    Agent: Botulinum Type A ( Onobotulinum Toxin type A ). 4 vials of Botox were used, containing 100 units and freshly diluted with 1 mL of sterile, non-preserved saline    Total injected (Units): 340  Total wasted (Units): 0   Pt tolerated procedure well without complications.   Reinjection is anticipated in 3 months.

## 2019-01-07 ENCOUNTER — Encounter: Payer: Self-pay | Admitting: Gastroenterology

## 2019-01-10 DIAGNOSIS — M25562 Pain in left knee: Secondary | ICD-10-CM | POA: Diagnosis not present

## 2019-01-10 DIAGNOSIS — M7052 Other bursitis of knee, left knee: Secondary | ICD-10-CM | POA: Diagnosis not present

## 2019-01-10 DIAGNOSIS — M79672 Pain in left foot: Secondary | ICD-10-CM | POA: Diagnosis not present

## 2019-01-10 DIAGNOSIS — R269 Unspecified abnormalities of gait and mobility: Secondary | ICD-10-CM | POA: Diagnosis not present

## 2019-01-12 DIAGNOSIS — M76822 Posterior tibial tendinitis, left leg: Secondary | ICD-10-CM | POA: Diagnosis not present

## 2019-01-17 ENCOUNTER — Ambulatory Visit (INDEPENDENT_AMBULATORY_CARE_PROVIDER_SITE_OTHER): Payer: Medicare Other | Admitting: Internal Medicine

## 2019-01-17 ENCOUNTER — Encounter: Payer: Self-pay | Admitting: Internal Medicine

## 2019-01-17 VITALS — BP 142/90 | HR 77 | Temp 97.9°F | Ht 64.0 in | Wt 174.0 lb

## 2019-01-17 DIAGNOSIS — F411 Generalized anxiety disorder: Secondary | ICD-10-CM | POA: Diagnosis not present

## 2019-01-17 DIAGNOSIS — L03116 Cellulitis of left lower limb: Secondary | ICD-10-CM

## 2019-01-17 DIAGNOSIS — R7302 Impaired glucose tolerance (oral): Secondary | ICD-10-CM

## 2019-01-17 MED ORDER — DOXYCYCLINE HYCLATE 100 MG PO TABS: 100.0000 mg | ORAL_TABLET | Freq: Two times a day (BID) | ORAL | 0 refills | Status: DC

## 2019-01-17 NOTE — Assessment & Plan Note (Signed)
stable overall by history and exam, recent data reviewed with pt, and pt to continue medical treatment as before,  to f/u any worsening symptoms or concerns  

## 2019-01-17 NOTE — Progress Notes (Signed)
Subjective:    Patient ID: [name] Bell, female    DOB: [birth year], 78 y.o.   MRN: [id number]  HPI   Here with 2 days onset red, tender, swelling without fever, red streaks or drainage to distal anterolat LLE about 3 cm above the ankle, where apparently a 1 cm x 5 mm area of skin was torn with taking off a left ankle wrap per rehab per ortho.  Pt denies chest pain, increased sob or doe, wheezing, orthopnea, PND, increased LE swelling, palpitations, dizziness or syncope.   Pt denies polydipsia, polyuria  Denies worsening depressive symptoms, suicidal ideation, or panic; has ongoing anxiety, no worsening recently Past Medical History:  Diagnosis Date  . 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 ALLERGIC RHINITIS 01/24/2008  . Allergy   . ANEMIA-IRON DEFICIENCY 01/24/2008   pt denied  . ANXIETY 02/26/2009  . BACK PAIN 09/30/2010  . Breast cancer ([place])   . Cancer of central portion of female breast ([place]) 05/04/2015   right breast/ no chemo or radiation done  . DIZZINESS 02/28/2010  . ELEVATED BLOOD PRESSURE WITHOUT DIAGNOSIS OF HYPERTENSION 02/26/2009   patient denies  . FATIGUE 02/26/2009  . GERD 01/24/2008  . History of blood transfusion ectopic pregnancy  . Neuromuscular disorder ([place])   . SHINGLES 11/06/2010  . TOE PAIN 09/30/2010  . VARICOSE VEINS, LOWER EXTREMITIES 08/16/2008  . 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 AUGMENTATION MAMMAPLASTY Bilateral 10/09/2016  . BACK SURGERY  2010   fusion  . BREAST LUMPECTOMY Right 05/14/2015   Procedure: RIGHT BREAST LUMPECTOMY;  Surgeon: Alphonsa Overall, MD;  Location: [place];  Bell: General;  Laterality: Right;  . BREAST RECONSTRUCTION WITH PLACEMENT OF TISSUE EXPANDER AND FLEX HD (ACELLULAR HYDRATED DERMIS) Right 10/09/2016   Procedure: IMMEDIATE RIGHT BREAST RECONSTRUCTION WITH PLACEMENT OF SILICONE GEL IMPLANTS OR TISSUE EXPANDER AND ACELLULAR DERMAL MATRIX;  Surgeon: Crissie Reese, MD;   Location: [place];  Bell: Plastics;  Laterality: Right;  . ECTOPIC PREGNANCY SURGERY     at 78 yo with salpingectomy  . FOOT SURGERY Right    bunion, hammer toe.  x 2  . FOOT SURGERY Right 05/2016   3 surgeries on right foot.  . INCISION AND DRAINAGE OF WOUND Right 10/09/2016   Procedure: IRRIGATION AND DEBRIDEMENT BREAST HEMATOMA;  Surgeon: Crissie Reese, MD;  Location: [place];  Bell: Plastics;  Laterality: Right;  . MASTECTOMY Right 10/09/2016  . MASTECTOMY W/ SENTINEL NODE BIOPSY Right 10/09/2016  . MASTECTOMY W/ SENTINEL NODE BIOPSY Right 10/09/2016   Procedure: RIGHT MASTECTOMY WITH SENTINEL LYMPH NODE BIOPSY;  Surgeon: Alphonsa Overall, MD;  Location: [place];  Bell: General;  Laterality: Right;  . s/p bilateral breasts implants  12/2007  . s/p lumbar surgury  july 2010  . TONSILLECTOMY      reports that she has never smoked. She has never used smokeless tobacco. She reports current alcohol use of about 6.0 standard drinks of alcohol per week. She reports that she does not use drugs. family history includes Asthma in her brother; Lung cancer in her father. Allergies  Allergen Reactions  . Advil [Ibuprofen]     Per pt, makes her feel "confused when she takes Advil with meds  . Iron     Oxidized iron-makes hand and body break out when touched.  . No Known Allergies    Current Outpatient Medications on File Prior to Visit  Medication Sig Dispense Refill  .  acetaminophen (TYLENOL) 325 MG tablet Take 650 mg by mouth every 6 (six) hours as needed.    . Biotin 1000 MCG tablet Take 1,000 mcg by mouth daily.    . clonazePAM (KLONOPIN) 1 MG tablet TAKE ONE (1) TABLET BY MOUTH TWO (2) TIMES DAILY 60 tablet 5  . Cyanocobalamin (VITAMIN B-12) 1000 MCG SUBL Place under the tongue. Vit b-12 1000 mg with Folate 400 mg-Take one daily    . fluticasone (FLONASE) 50 MCG/ACT nasal spray USE 2 SPRAYS INTO BOTH NOSTRILS DAILY (Patient taking differently: as needed. ) 16 g 2  . hydrochlorothiazide  (HYDRODIURIL) 25 MG tablet TAKE 1 TABLET BY MOUTH DAILY 90 tablet 0  . OVER THE COUNTER MEDICATION Drinks beet juice daily.    Marland Kitchen OVER THE COUNTER MEDICATION Drinks vegetable juice daily.    Marland Kitchen RETIN-A 0.05 % cream APPLY TO AFFECTED AREA EVERY BEDTIME 45 g 1  . tiZANidine (ZANAFLEX) 2 MG tablet Take 1 tablet (2 mg total) by mouth every 6 (six) hours as needed for muscle spasms. 30 tablet 5  . traZODone (DESYREL) 50 MG tablet TAKE ONE TABLET BY MOUTH AT BEDTIME 90 tablet 1  . tretinoin (RETIN-A) 0.025 % cream Apply topically at bedtime. 45 g 0  . TURMERIC PO Take by mouth. Turmeric and Ginger -Take one pill tid     No current facility-administered medications on file prior to visit.    Review of Systems  Constitutional: Negative for other unusual diaphoresis or sweats HENT: Negative for ear discharge or swelling Eyes: Negative for other worsening visual disturbances Respiratory: Negative for stridor or other swelling  Gastrointestinal: Negative for worsening distension or other blood Genitourinary: Negative for retention or other urinary change Musculoskeletal: Negative for other MSK pain or swelling Skin: Negative for color change or other new lesions Neurological: Negative for worsening tremors and other numbness  Psychiatric/Behavioral: Negative for worsening agitation or other fatigue ALl other system neg per pt    Objective:   Physical Exam BP (!) 142/90   Pulse 77   Temp 97.9 F (36.6 C) (Oral)   Ht 5\' 4"  (1.626 m)   Wt 174 lb (78.9 kg)   SpO2 94%   BMI 29.87 kg/m  VS noted,  Constitutional: Pt appears in NAD HENT: Head: NCAT.  Right Ear: External ear normal.  Left Ear: External ear normal.  Eyes: . Pupils are equal, round, and reactive to light. Conjunctivae and EOM are normal Nose: without d/c or deformity Neck: Neck supple. Gross normal ROM Cardiovascular: Normal rate and regular rhythm.   Pulmonary/Chest: Effort normal and breath sounds without rales or wheezing.    Abd:  Soft, NT, ND, + BS, no organomegaly Neurological: Pt is alert. At baseline orientation, motor grossly intact Skin: Skin is warm, distal anterolat LLE about 3 cm above the ankle, with a 1 cm x 5 mm area of skin was torn now with 1 cm surrounding tender erythema, swelling without fluctuance, drainage or red streaks Psychiatric: Pt behavior is normal without agitation , mild nervous No other exam findings Lab Results  Component Value Date   WBC 6.1 12/03/2018   HGB 14.5 12/03/2018   HCT 43.2 12/03/2018   PLT 322.0 12/03/2018   GLUCOSE 91 12/03/2018   CHOL 197 12/03/2018   TRIG 66.0 12/03/2018   HDL 70.90 12/03/2018   LDLDIRECT 128.5 07/04/2013   LDLCALC 113 (H) 12/03/2018   ALT 17 12/03/2018   AST 17 12/03/2018   NA 137 12/03/2018   K 3.8  12/03/2018   CL 100 12/03/2018   CREATININE 0.81 12/03/2018   BUN 10 12/03/2018   CO2 28 12/03/2018   TSH 1.71 12/03/2018   INR 0.9 07/17/2009   HGBA1C 5.4 12/03/2018          Assessment & Plan:

## 2019-01-17 NOTE — Assessment & Plan Note (Signed)
Mild to mod, for antibx course,  to f/u any worsening symptoms or concerns 

## 2019-01-17 NOTE — Patient Instructions (Signed)
Please take all new medication as prescribed - the antibiotic  Please continue all other medications as before, and refills have been done if requested.  Please have the pharmacy call with any other refills you may need.  Please continue your efforts at being more active, low cholesterol diet, and weight control.  Please keep your appointments with your specialists as you may have planned    

## 2019-02-17 ENCOUNTER — Other Ambulatory Visit: Payer: Self-pay | Admitting: Internal Medicine

## 2019-02-23 DIAGNOSIS — M76822 Posterior tibial tendinitis, left leg: Secondary | ICD-10-CM | POA: Diagnosis not present

## 2019-02-23 DIAGNOSIS — M25572 Pain in left ankle and joints of left foot: Secondary | ICD-10-CM | POA: Diagnosis not present

## 2019-02-26 DIAGNOSIS — M25572 Pain in left ankle and joints of left foot: Secondary | ICD-10-CM | POA: Diagnosis not present

## 2019-03-01 ENCOUNTER — Telehealth: Payer: Self-pay | Admitting: Neurology

## 2019-03-01 NOTE — Telephone Encounter (Signed)
Yes Ma'am lmom. Thanks

## 2019-03-01 NOTE — Telephone Encounter (Signed)
I have her scheduled on July 15, 2019 at 1:00 pm with her husband scheduled after at 1:30 pm. Can you please let her know? Thanks so much.

## 2019-03-01 NOTE — Telephone Encounter (Signed)
Patient called to get information on when her next Botox appointment was scheduled for. She also is asking if [name] can be schedule in July along with her on the next Botox day in July? Thanks

## 2019-03-08 ENCOUNTER — Other Ambulatory Visit: Payer: Self-pay | Admitting: Neurology

## 2019-03-10 DIAGNOSIS — M25572 Pain in left ankle and joints of left foot: Secondary | ICD-10-CM | POA: Diagnosis not present

## 2019-03-25 ENCOUNTER — Encounter: Payer: Self-pay | Admitting: Neurology

## 2019-03-25 NOTE — Progress Notes (Signed)
Botox approval valid through 12/30/2019. Patient to use buy and bill.

## 2019-04-15 ENCOUNTER — Other Ambulatory Visit: Payer: Self-pay

## 2019-04-15 ENCOUNTER — Ambulatory Visit (INDEPENDENT_AMBULATORY_CARE_PROVIDER_SITE_OTHER): Payer: Medicare Other | Admitting: Neurology

## 2019-04-15 ENCOUNTER — Ambulatory Visit: Payer: Medicare Other | Admitting: Neurology

## 2019-04-15 DIAGNOSIS — G243 Spasmodic torticollis: Secondary | ICD-10-CM | POA: Diagnosis not present

## 2019-04-15 MED ORDER — ONABOTULINUMTOXINA 100 UNITS IJ SOLR
340.0000 [IU] | Freq: Once | INTRAMUSCULAR | Status: AC
  Administered 2019-04-15: 14:00:00 340 [IU] via INTRAMUSCULAR

## 2019-04-15 NOTE — Procedures (Signed)
[facility]   Procedure Note Botox  Attending: Dr. Wells Guiles Leanor Voris  Preoperative Diagnosis(es): Cervical Dystonia  Result History  Onset of effect: immediately Wore off: 4 days ago   Consent obtained from: The patient  Benefits discussed included, but were not limited to decreased muscle tightness, increased joint range of motion, and decreased pain.  Risk discussed included, but were not limited pain and discomfort, bleeding, bruising, excessive weakness, venous thrombosis, muscle atrophy and dysphagia.  A copy of the patient medication guide was given to the patient which explains the blackbox warning.  Informed consent was obtained  Patients identity and treatment sites confirmed yes.     Details of Procedure: Skin was cleaned with alcohol.  A 30 gauge, 1/2 inch needle was introduced to the target muscle (except splenius capitus, posterior approach, where 27 inch, 1 1/2 gauge needle was used).  Prior to injection, the needle plunger was aspirated to make sure the needle was not within a blood vessel.  There was no blood retrieved on aspiration.    Following is a summary of the muscles injected  And the amount of Botulinum toxin used:   Dilution 0.9% preservative free saline mixed with 100 u Botox type A to make 10 U per 0.1cc  Injections  Location Left  Right Units Number of sites        Sternocleidomastoid 60+40 0 100 1  Splenius Capitus, posterior approach 0 80 80 1  Splenius Capitus, lateral approach 0 60 60 1  Levator Scapulae 30  30 0  Trapezius 10/18/19/20  70         TOTAL UNITS:   340    Agent: Botulinum Type A ( Onobotulinum Toxin type A ). 4 vials of Botox were used, containing 100 units and freshly diluted with 1 mL of sterile, non-preserved saline    Total injected (Units): 340  Total wasted (Units): 0   Pt tolerated procedure well without complications.   Reinjection is anticipated in 3 months.

## 2019-04-18 ENCOUNTER — Ambulatory Visit (INDEPENDENT_AMBULATORY_CARE_PROVIDER_SITE_OTHER): Payer: Medicare Other | Admitting: *Deleted

## 2019-04-18 DIAGNOSIS — Z0000 Encounter for general adult medical examination without abnormal findings: Secondary | ICD-10-CM | POA: Diagnosis not present

## 2019-04-18 NOTE — Progress Notes (Addendum)
Subjective:   [name] Bell is a 78 y.o. female who presents for an Initial Medicare Annual Wellness Visit.  I connected with patient 04/18/19 at 11:00 AM EDT by a video enabled telemedicine application and verified that I am speaking with the correct person using two identifiers. Patient stated full name and DOB. Patient gave permission to continue with virtual visit. Patient's location was at home and Nurse's location was at [place] office.   Review of Systems    No ROS.  Medicare Wellness Visit. Additional risk factors are reflected in the social history.   Cardiac Risk Factors include: advanced age (>26men, >9 women) Sleep patterns: feels rested on waking, gets up 0-1 times nightly to void and sleeps 7-8 hours nightly.    Home Safety/Smoke Alarms: Feels safe in home. Smoke alarms in place.  Living environment; residence and Firearm Safety: 1-story house/ trailer. Lives with husband, no needs for DME, good support system Seat Belt Safety/Bike Helmet: Wears seat belt.     Objective:    There were no vitals filed for this visit. There is no height or weight on file to calculate BMI.  Advanced Directives 04/18/2019 02/24/2017 10/09/2016 10/01/2016 02/26/2016 08/28/2015 05/29/2015  Does Patient Have a Medical Advance Directive? Yes No No No No No No  Type of Paramedic of [place];Living will - - - - - -  Copy of [place] in Chart? No - copy requested - - - - - -  Would patient like information on creating a medical advance directive? - - No - patient declined information No - patient declined information Yes - Educational materials given - No - patient declined information    Current Medications (verified) Outpatient Encounter Medications as of 04/18/2019  Medication Sig  . acetaminophen (TYLENOL) 325 MG tablet Take 650 mg by mouth every 6 (six) hours as needed.  . Biotin 1000 MCG tablet Take 1,000 mcg by mouth daily.  . clonazePAM  (KLONOPIN) 1 MG tablet TAKE ONE (1) TABLET BY MOUTH TWO (2) TIMES DAILY  . Cyanocobalamin (VITAMIN B-12) 1000 MCG SUBL Place under the tongue. Vit b-12 1000 mg with Folate 400 mg-Take one daily  . fluticasone (FLONASE) 50 MCG/ACT nasal spray USE 2 SPRAYS INTO BOTH NOSTRILS DAILY (Patient taking differently: as needed. )  . glucosamine-chondroitin 500-400 MG tablet Take 1 tablet by mouth daily.  . hydrochlorothiazide (HYDRODIURIL) 25 MG tablet TAKE 1 TABLET BY MOUTH DAILY (Patient taking differently: as needed. )  . OVER THE COUNTER MEDICATION Drinks beet juice daily.  Marland Kitchen OVER THE COUNTER MEDICATION Drinks vegetable juice daily.  Marland Kitchen tiZANidine (ZANAFLEX) 2 MG tablet Take 1 tablet (2 mg total) by mouth every 6 (six) hours as needed for muscle spasms.  . traZODone (DESYREL) 50 MG tablet TAKE ONE TABLET BY MOUTH EVERY NIGHT AT BEDTIME  . tretinoin (RETIN-A) 0.025 % cream Apply topically at bedtime.  . TURMERIC PO Take by mouth. Turmeric and Ginger -Take one pill tid  . [DISCONTINUED] doxycycline (VIBRA-TABS) 100 MG tablet TAKE ONE (1) TABLET BY MOUTH TWO (2) TIMES DAILY (Patient not taking: Reported on 04/18/2019)  . [DISCONTINUED] RETIN-A 0.05 % cream APPLY TO AFFECTED AREA EVERY BEDTIME (Patient not taking: Reported on 04/18/2019)   No facility-administered encounter medications on file as of 04/18/2019.     Allergies (verified) Advil [ibuprofen]; Iron; and No known allergies   History: Past Medical History:  Diagnosis Date  . 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 ALLERGIC RHINITIS 01/24/2008  .  Allergy   . ANEMIA-IRON DEFICIENCY 01/24/2008   pt denied  . ANXIETY 02/26/2009  . BACK PAIN 09/30/2010  . Breast cancer ([place])   . 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 DIZZINESS 02/28/2010  . ELEVATED BLOOD PRESSURE WITHOUT DIAGNOSIS OF HYPERTENSION 02/26/2009   patient denies  . FATIGUE 02/26/2009  . GERD 01/24/2008  .  History of blood transfusion ectopic pregnancy  . Neuromuscular disorder ([place])   . SHINGLES 11/06/2010  . TOE PAIN 09/30/2010  . VARICOSE VEINS, LOWER EXTREMITIES 08/16/2008  . 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 AUGMENTATION MAMMAPLASTY Bilateral 10/09/2016  . BACK SURGERY  2010   fusion  . BREAST LUMPECTOMY Right 05/14/2015   Procedure: RIGHT BREAST LUMPECTOMY;  Surgeon: Alphonsa Overall, MD;  Location: [facility];  Service: General;  Laterality: Right;  . BREAST RECONSTRUCTION WITH PLACEMENT OF TISSUE EXPANDER AND FLEX HD (ACELLULAR HYDRATED DERMIS) Right 10/09/2016   Procedure: IMMEDIATE RIGHT BREAST RECONSTRUCTION WITH PLACEMENT OF SILICONE GEL IMPLANTS OR TISSUE EXPANDER AND ACELLULAR DERMAL MATRIX;  Surgeon: Crissie Reese, MD;  Location: [facility];  Service: Plastics;  Laterality: Right;  . ECTOPIC PREGNANCY SURGERY     at 78 yo with salpingectomy  . FOOT SURGERY Right    bunion, hammer toe.  x 2  . FOOT SURGERY Right 05/2016   3 surgeries on right foot.  . INCISION AND DRAINAGE OF WOUND Right 10/09/2016   Procedure: IRRIGATION AND DEBRIDEMENT BREAST HEMATOMA;  Surgeon: Crissie Reese, MD;  Location: [facility];  Service: Plastics;  Laterality: Right;  . MASTECTOMY Right 10/09/2016  . MASTECTOMY W/ SENTINEL NODE BIOPSY Right 10/09/2016  . MASTECTOMY W/ SENTINEL NODE BIOPSY Right 10/09/2016   Procedure: RIGHT MASTECTOMY WITH SENTINEL LYMPH NODE BIOPSY;  Surgeon: Alphonsa Overall, MD;  Location: [facility];  Service: General;  Laterality: Right;  . s/p bilateral breasts implants  12/2007  . s/p lumbar surgury  july 2010  . TONSILLECTOMY     Family History  Problem Relation Age of Onset  . Lung cancer Father        smoker  . Asthma Brother   . Breast cancer Neg Hx   . Colon cancer Neg Hx   . Esophageal cancer Neg Hx   . Rectal cancer Neg Hx   . Stomach cancer Neg Hx    Social History   Socioeconomic History  . Marital status: Married    Spouse  name: Not on file  . Number of children: 2  . Years of education: Not on file  . Highest education level: Not on file  Occupational History  . Occupation: unemployed housewife  Social Needs  . Financial resource strain: Not hard at all  . Food insecurity:    Worry: Never true    Inability: Never true  . Transportation needs:    Medical: No    Non-medical: No  Tobacco Use  . Smoking status: Never Smoker  . Smokeless tobacco: Never Used  Substance and Sexual Activity  . Alcohol use: Yes    Alcohol/week: 6.0 standard drinks    Types: 6 Glasses of Kerriann Kamphuis per week    Comment: 1 glass Skylur Fuston/day, 3-4 per week  . Drug use: No  . Sexual activity: Yes    Partners: Male  Lifestyle  . Physical activity:    Days per week: 0 days    Minutes per session: 0  min  . Stress: Not at all  Relationships  . Social connections:    Talks on phone: More than three times a week    Gets together: More than three times a week    Attends religious service: 1 to 4 times per year    Active member of club or organization: Yes    Attends meetings of clubs or organizations: 1 to 4 times per year    Relationship status: Married  Other Topics Concern  . Not on file  Social History Narrative  . Not on file    Tobacco Counseling Counseling given: Not Answered  Activities of Daily Living In your present state of health, do you have any difficulty performing the following activities: 04/18/2019  Hearing? N  Vision? N  Difficulty concentrating or making decisions? N  Walking or climbing stairs? N  Dressing or bathing? N  Doing errands, shopping? N  Preparing Food and eating ? N  Using the Toilet? N  In the past six months, have you accidently leaked urine? N  Do you have problems with loss of bowel control? N  Managing your Medications? N  Managing your Finances? N  Housekeeping or managing your Housekeeping? N  Some recent data might be hidden     Immunizations and Health Maintenance  Immunization History  Administered Date(s) Administered  . Influenza Split 09/29/2011, 10/21/2012  . Influenza Whole 09/25/2009, 09/30/2010  . Influenza, High Dose Seasonal PF 11/23/2013, 10/18/2015, 12/02/2016, 11/25/2017, 10/07/2018  . Influenza,inj,Quad PF,6+ Mos 09/21/2014  . Pneumococcal Conjugate-13 10/24/2014  . Pneumococcal Polysaccharide-23 12/29/2006, 06/22/2018  . Td 03/30/2007  . Tdap 11/25/2017  . Zoster 09/29/2011   There are no preventive care reminders to display for this patient.  Patient Care Team: Biagio Borg, MD as PCP - Stevan Born, MD as Consulting Physician (General Surgery) Nicholas Lose, MD as Consulting Physician (Hematology and Oncology) Thea Silversmith, MD as Consulting Physician (Radiation Oncology) Mauro Kaufmann, RN as Registered Nurse Rockwell Germany, RN as Registered Nurse Holley Bouche, NP (Inactive) as Nurse Practitioner (Nurse Practitioner) Sylvan Cheese, NP as Nurse Practitioner (Nurse Practitioner) Tat, Eustace Quail, DO as Consulting Physician (Neurology) Loletha Carrow Kirke Corin, MD as Consulting Physician (Gastroenterology)  Indicate any recent Medical Services you may have received from other than [facility] providers in the past year (date may be approximate).     Assessment:   This is a routine wellness examination for Latana. Physical assessment deferred to PCP.  Hearing/Vision screen Hearing Screening Comments: Able to hear conversational tones w/o difficulty. No issues reported.  Passed whisper test Vision Screening Comments: appointment yearly Dr. Nicki Reaper  Dietary issues and exercise activities discussed: Current Exercise Habits: The patient does not participate in regular exercise at present(reports stays moving during the day.), Exercise limited by: orthopedic condition(s)  Diet (meal preparation, eat out, water intake, caffeinated beverages, dairy products, fruits and vegetables): in general, a "healthy" diet  ,  well balanced. eats a variety of fruits and vegetables daily, limits salt, fat/cholesterol, sugar,carbohydrates,caffeine, drinks 6-8 glasses of water daily.  Goals   None    Depression Screen PHQ 2/9 Scores 12/03/2018 06/22/2018 11/25/2017 12/02/2016 04/16/2016 04/16/2016 03/27/2015  PHQ - 2 Score 0 0 0 0 0 0 0    Fall Risk Fall Risk  12/03/2018 06/22/2018 11/25/2017 12/02/2016 04/16/2016  Falls in the past year? 0 No No No No   Cognitive Function:       Ad8 score reviewed for issues:  Issues making  decisions: no  Less interest in hobbies / activities: no  Repeats questions, stories (family complaining): no  Trouble using ordinary gadgets (microwave, computer, phone):no  Forgets the month or year: no  Mismanaging finances: no  Remembering appts: no  Daily problems with thinking and/or memory: no Ad8 score is= 0  Screening Tests Health Maintenance  Topic Date Due  . INFLUENZA VACCINE  07/30/2019  . TETANUS/TDAP  11/26/2027  . COLONOSCOPY  12/31/2028  . DEXA SCAN  Completed  . PNA vac Low Risk Adult  Completed     Plan:     Reviewed health maintenance screenings with patient today and relevant education, vaccines, and/or referrals were provided.   Continue to eat heart healthy diet (full of fruits, vegetables, whole grains, lean protein, water--limit salt, fat, and sugar intake) and increase physical activity as tolerated.  Continue doing brain stimulating activities (puzzles, reading, adult coloring books, staying active) to keep memory sharp.   I have personally reviewed and noted the following in the patient's chart:   . Medical and social history . Use of alcohol, tobacco or illicit drugs  . Current medications and supplements . Functional ability and status . Nutritional status . Physical activity . Advanced directives . List of other physicians . Vitals . Screenings to include cognitive, depression, and falls . Referrals and appointments  In addition, I  have reviewed and discussed with patient certain preventive protocols, quality metrics, and best practice recommendations. A written personalized care plan for preventive services as well as general preventive health recommendations were provided to patient.     Michiel Cowboy, RN   04/18/2019     Medical screening examination/treatment/procedure(s) were performed by non-physician practitioner and as supervising physician I was immediately available for consultation/collaboration. I agree with above. Binnie Rail, MD

## 2019-04-18 NOTE — Patient Instructions (Signed)
Continue doing brain stimulating activities (puzzles, reading, adult coloring books, staying active) to keep memory sharp.   Continue to eat heart healthy diet (full of fruits, vegetables, whole grains, lean protein, water--limit salt, fat, and sugar intake) and increase physical activity as tolerated.   Ms. [name] Bell , Thank you for taking time to come for your Medicare Wellness Visit. I appreciate your ongoing commitment to your health goals. Please review the following plan we discussed and let me know if I can assist you in the future.   These are the goals we discussed: Goals     Patient Stated     Stay as healthy and as independent as possible.       This is a list of the screening recommended for you and due dates:  Health Maintenance  Topic Date Due   Flu Shot  07/30/2019   Tetanus Vaccine  11/26/2027   Colon Cancer Screening  12/31/2028   DEXA scan (bone density measurement)  Completed   Pneumonia vaccines  Completed    Preventive Care 41 Years and Older, Female Preventive care refers to lifestyle choices and visits with your health care provider that can promote health and wellness. What does preventive care include?  A yearly physical exam. This is also called an annual well check.  Dental exams once or twice a year.  Routine eye exams. Ask your health care provider how often you should have your eyes checked.  Personal lifestyle choices, including: ? Daily care of your teeth and gums. ? Regular physical activity. ? Eating a healthy diet. ? Avoiding tobacco and drug use. ? Limiting alcohol use. ? Practicing safe sex. ? Taking low-dose aspirin every day. ? Taking vitamin and mineral supplements as recommended by your health care provider. What happens during an annual well check? The services and screenings done by your health care provider during your annual well check will depend on your age, overall health, lifestyle risk factors, and family history of  disease. Counseling Your health care provider may ask you questions about your:  Alcohol use.  Tobacco use.  Drug use.  Emotional well-being.  Home and relationship well-being.  Sexual activity.  Eating habits.  History of falls.  Memory and ability to understand (cognition).  Work and work Statistician.  Reproductive health.  Screening You may have the following tests or measurements:  Height, weight, and BMI.  Blood pressure.  Lipid and cholesterol levels. These may be checked every 5 years, or more frequently if you are over 39 years old.  Skin check.  Lung cancer screening. You may have this screening every year starting at age 49 if you have a 30-pack-year history of smoking and currently smoke or have quit within the past 15 years.  Colorectal cancer screening. All adults should have this screening starting at age 36 and continuing until age 46. You will have tests every 1-10 years, depending on your results and the type of screening test. People at increased risk should start screening at an earlier age. Screening tests may include: ? Guaiac-based fecal occult blood testing. ? Fecal immunochemical test (FIT). ? Stool DNA test. ? Virtual colonoscopy. ? Sigmoidoscopy. During this test, a flexible tube with a tiny camera (sigmoidoscope) is used to examine your rectum and lower colon. The sigmoidoscope is inserted through your anus into your rectum and lower colon. ? Colonoscopy. During this test, a long, thin, flexible tube with a tiny camera (colonoscope) is used to examine your entire colon and rectum.  Hepatitis C blood test. °· Hepatitis B blood test. °· Sexually transmitted disease (STD) testing. °· Diabetes screening. This is done by checking your blood sugar (glucose) after you have not eaten for a while (fasting). You may have this done every 1-3 years. °· Bone density scan. This is done to screen for osteoporosis. You may have this done starting at age  65. °· Mammogram. This may be done every 1-2 years. Talk to your health care provider about how often you should have regular mammograms. °Talk with your health care provider about your test results, treatment options, and if necessary, the need for more tests. °Vaccines °Your health care provider may recommend certain vaccines, such as: °· Influenza vaccine. This is recommended every year. °· Tetanus, diphtheria, and acellular pertussis (Tdap, Td) vaccine. You may need a Td booster every 10 years. °· Varicella vaccine. You may need this if you have not been vaccinated. °· Zoster vaccine. You may need this after age 60. °· Measles, mumps, and rubella (MMR) vaccine. You may need at least one dose of MMR if you were born in 1957 or later. You may also need a second dose. °· Pneumococcal 13-valent conjugate (PCV13) vaccine. One dose is recommended after age 65. °· Pneumococcal polysaccharide (PPSV23) vaccine. One dose is recommended after age 65. °· Meningococcal vaccine. You may need this if you have certain conditions. °· Hepatitis A vaccine. You may need this if you have certain conditions or if you travel or work in places where you may be exposed to hepatitis A. °· Hepatitis B vaccine. You may need this if you have certain conditions or if you travel or work in places where you may be exposed to hepatitis B. °· Haemophilus influenzae type b (Hib) vaccine. You may need this if you have certain conditions. °Talk to your health care provider about which screenings and vaccines you need and how often you need them. °This information is not intended to replace advice given to you by your health care provider. Make sure you discuss any questions you have with your health care provider. °Document Released: 01/11/2016 Document Revised: 02/04/2018 Document Reviewed: 10/16/2015 °Elsevier Interactive Patient Education © 2019 [employer] ° °

## 2019-04-27 DIAGNOSIS — M79672 Pain in left foot: Secondary | ICD-10-CM | POA: Diagnosis not present

## 2019-05-26 ENCOUNTER — Other Ambulatory Visit: Payer: Self-pay | Admitting: Neurology

## 2019-05-27 NOTE — Telephone Encounter (Signed)
Provider approved 

## 2019-05-27 NOTE — Telephone Encounter (Signed)
Requested Prescriptions   Pending Prescriptions Disp Refills  . clonazePAM (KLONOPIN) 1 MG tablet [Pharmacy Med Name: CLONAZEPAM 1 MG TAB] 60 tablet     Sig: TAKE ONE (1) TABLET BY MOUTH TWO (2) TIMES DAILY   Rx last filled:11/25/1+ #60 5 REFILLS  Pt last seen:03/19/18 IMPRESSION/PLAN:  1. Cervical dystonia             -Discussed again her issue with dysphagia.  This is likely due to levator scapulae injections.  We have limited this in the past and she had no dysphagia, but was not happy with control of dystonia.  She understands risks.  She wants to continue injections as is.  2.  Insomnia             -Patient wanted to increase her clonazepam, but I told her I did not want to do that.  I would rather add something else that is primarily for sleep, rather than going on Betadine.  We will start trazodone, 50 mg at night.  Risks, benefits, side effects and alternative therapies were discussed.  The opportunity to ask questions was given and they were answered to the best of my ability.  The patient expressed understanding and willingness to follow the outlined treatment protocols.    Follow up appt scheduled: NONE

## 2019-06-09 ENCOUNTER — Other Ambulatory Visit: Payer: Self-pay | Admitting: Internal Medicine

## 2019-06-09 DIAGNOSIS — Z1231 Encounter for screening mammogram for malignant neoplasm of breast: Secondary | ICD-10-CM

## 2019-06-22 ENCOUNTER — Ambulatory Visit: Payer: Medicare Other | Admitting: Internal Medicine

## 2019-06-22 DIAGNOSIS — H35363 Drusen (degenerative) of macula, bilateral: Secondary | ICD-10-CM | POA: Diagnosis not present

## 2019-06-27 DIAGNOSIS — M79672 Pain in left foot: Secondary | ICD-10-CM | POA: Diagnosis not present

## 2019-06-27 DIAGNOSIS — M19079 Primary osteoarthritis, unspecified ankle and foot: Secondary | ICD-10-CM | POA: Insufficient documentation

## 2019-06-27 DIAGNOSIS — M19071 Primary osteoarthritis, right ankle and foot: Secondary | ICD-10-CM | POA: Diagnosis not present

## 2019-06-27 DIAGNOSIS — M79671 Pain in right foot: Secondary | ICD-10-CM | POA: Diagnosis not present

## 2019-06-27 DIAGNOSIS — M19072 Primary osteoarthritis, left ankle and foot: Secondary | ICD-10-CM | POA: Diagnosis not present

## 2019-07-15 ENCOUNTER — Other Ambulatory Visit: Payer: Self-pay

## 2019-07-15 ENCOUNTER — Ambulatory Visit (INDEPENDENT_AMBULATORY_CARE_PROVIDER_SITE_OTHER): Payer: Medicare Other | Admitting: Neurology

## 2019-07-15 DIAGNOSIS — G243 Spasmodic torticollis: Secondary | ICD-10-CM

## 2019-07-15 MED ORDER — ONABOTULINUMTOXINA 100 UNITS IJ SOLR
340.0000 [IU] | Freq: Once | INTRAMUSCULAR | Status: AC
  Administered 2019-07-15: 340 [IU] via INTRAMUSCULAR

## 2019-07-15 NOTE — Procedures (Signed)
[facility]   Procedure Note Botox  Attending: Dr. Wells Guiles Paola Flynt  Preoperative Diagnosis(es): Cervical Dystonia  Result History  Onset of effect: immediately Little more dysphagia again last visit   Consent obtained from: The patient  Benefits discussed included, but were not limited to decreased muscle tightness, increased joint range of motion, and decreased pain.  Risk discussed included, but were not limited pain and discomfort, bleeding, bruising, excessive weakness, venous thrombosis, muscle atrophy and dysphagia.  A copy of the patient medication guide was given to the patient which explains the blackbox warning.  Informed consent was obtained  Patients identity and treatment sites confirmed yes.     Details of Procedure: Skin was cleaned with alcohol.  A 30 gauge, 1/2 inch needle was introduced to the target muscle (except splenius capitus, posterior approach, where 27 inch, 1 1/2 gauge needle was used).  Prior to injection, the needle plunger was aspirated to make sure the needle was not within a blood vessel.  There was no blood retrieved on aspiration.    Following is a summary of the muscles injected  And the amount of Botulinum toxin used:   Dilution 0.9% preservative free saline mixed with 100 u Botox type A to make 10 U per 0.1cc  Injections  Location Left  Right Units Number of sites        Sternocleidomastoid 60+40 0 100 1  Splenius Capitus, posterior approach 0 80 80 1  Splenius Capitus, lateral approach 0 60 60 1  Levator Scapulae 30  30 0  Trapezius 10/18/19/20  70         TOTAL UNITS:   340    Agent: Botulinum Type A ( Onobotulinum Toxin type A ). 4 vials of Botox were used, containing 100 units and freshly diluted with 1 mL of sterile, non-preserved saline    Total injected (Units): 340  Total wasted (Units): 10   Pt tolerated procedure well without complications.   Reinjection is anticipated in 3 months.

## 2019-07-15 NOTE — Progress Notes (Signed)
10 units of waste

## 2019-07-25 ENCOUNTER — Other Ambulatory Visit: Payer: Self-pay

## 2019-07-25 ENCOUNTER — Ambulatory Visit
Admission: RE | Admit: 2019-07-25 | Discharge: 2019-07-25 | Disposition: A | Discharge status: Home / Self Care | Payer: Medicare Other | Source: Ambulatory Visit | Attending: Internal Medicine | Admitting: Internal Medicine

## 2019-07-25 DIAGNOSIS — Z1231 Encounter for screening mammogram for malignant neoplasm of breast: Secondary | ICD-10-CM | POA: Diagnosis not present

## 2019-07-28 ENCOUNTER — Other Ambulatory Visit: Payer: Self-pay

## 2019-07-28 ENCOUNTER — Ambulatory Visit (INDEPENDENT_AMBULATORY_CARE_PROVIDER_SITE_OTHER): Payer: Medicare Other | Admitting: Internal Medicine

## 2019-07-28 ENCOUNTER — Encounter: Payer: Self-pay | Admitting: Internal Medicine

## 2019-07-28 VITALS — BP 124/86 | HR 67 | Temp 97.9°F | Ht 64.0 in | Wt 163.0 lb

## 2019-07-28 DIAGNOSIS — K219 Gastro-esophageal reflux disease without esophagitis: Secondary | ICD-10-CM | POA: Diagnosis not present

## 2019-07-28 DIAGNOSIS — E785 Hyperlipidemia, unspecified: Secondary | ICD-10-CM

## 2019-07-28 DIAGNOSIS — R7302 Impaired glucose tolerance (oral): Secondary | ICD-10-CM | POA: Diagnosis not present

## 2019-07-28 NOTE — Assessment & Plan Note (Signed)
stable overall by history and exam, recent data reviewed with pt, and pt to continue medical treatment as before,  to f/u any worsening symptoms or concerns  

## 2019-07-28 NOTE — Progress Notes (Signed)
Subjective:    Patient ID: [name] Bell, female    DOB: 04/19/1941, 78 y.o.   MRN: [id number]  HPI  Here to f/u; overall doing ok,  Pt denies chest pain, increasing sob or doe, wheezing, orthopnea, PND, increased LE swelling, palpitations, dizziness or syncope.  Pt denies new neurological symptoms such as new headache, or facial or extremity weakness or numbness.  Pt denies polydipsia, polyuria, or low sugar episode.  Pt states overall good compliance with meds, mostly trying to follow appropriate diet, with wt overall stable,  but little exercise however. No new complaints  Denies worsening reflux, abd pain, dysphagia, n/v, bowel change or blood.   Past Medical History:  Diagnosis Date  . 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 ALLERGIC RHINITIS 01/24/2008  . Allergy   . ANEMIA-IRON DEFICIENCY 01/24/2008   pt denied  . ANXIETY 02/26/2009  . BACK PAIN 09/30/2010  . Breast cancer ([place])   . 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 DIZZINESS 02/28/2010  . ELEVATED BLOOD PRESSURE WITHOUT DIAGNOSIS OF HYPERTENSION 02/26/2009   patient denies  . FATIGUE 02/26/2009  . GERD 01/24/2008  . History of blood transfusion ectopic pregnancy  . Neuromuscular disorder ([place])   . SHINGLES 11/06/2010  . TOE PAIN 09/30/2010  . VARICOSE VEINS, LOWER EXTREMITIES 08/16/2008  . 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 AUGMENTATION MAMMAPLASTY Bilateral 10/09/2016  . BACK SURGERY  2010   fusion  . BREAST LUMPECTOMY Right 05/14/2015   Procedure: RIGHT BREAST LUMPECTOMY;  Surgeon: Alphonsa Overall, MD;  Location: [facility];  Service: General;  Laterality: Right;  . BREAST RECONSTRUCTION WITH PLACEMENT OF TISSUE EXPANDER AND FLEX HD (ACELLULAR HYDRATED DERMIS) Right 10/09/2016   Procedure: IMMEDIATE RIGHT BREAST RECONSTRUCTION WITH PLACEMENT OF SILICONE GEL IMPLANTS OR TISSUE EXPANDER AND ACELLULAR  DERMAL MATRIX;  Surgeon: Crissie Reese, MD;  Location: [facility];  Service: Plastics;  Laterality: Right;  . ECTOPIC PREGNANCY SURGERY     at 78 yo with salpingectomy  . FOOT SURGERY Right    bunion, hammer toe.  x 2  . FOOT SURGERY Right 05/2016   3 surgeries on right foot.  . INCISION AND DRAINAGE OF WOUND Right 10/09/2016   Procedure: IRRIGATION AND DEBRIDEMENT BREAST HEMATOMA;  Surgeon: Crissie Reese, MD;  Location: [facility];  Service: Plastics;  Laterality: Right;  . MASTECTOMY Right 10/09/2016  . MASTECTOMY W/ SENTINEL NODE BIOPSY Right 10/09/2016  . MASTECTOMY W/ SENTINEL NODE BIOPSY Right 10/09/2016   Procedure: RIGHT MASTECTOMY WITH SENTINEL LYMPH NODE BIOPSY;  Surgeon: Alphonsa Overall, MD;  Location: [facility];  Service: General;  Laterality: Right;  . s/p bilateral breasts implants  12/2007  . s/p lumbar surgury  july 2010  . TONSILLECTOMY      reports that she has never smoked. She has never used smokeless tobacco. She reports current alcohol use of about 6.0 standard drinks of alcohol per week. She reports that she does not use drugs. family history includes Asthma in her brother; Lung cancer in her father. Allergies  Allergen Reactions  . Advil [Ibuprofen]     Per pt, makes her feel "confused when she takes Advil with meds  . Iron     Oxidized iron-makes hand and body break out when touched.  . No Known Allergies    Current Outpatient Medications on File Prior to Visit  Medication Sig Dispense Refill  .  acetaminophen (TYLENOL) 325 MG tablet Take 650 mg by mouth every 6 (six) hours as needed.    . Biotin 1000 MCG tablet Take 1,000 mcg by mouth daily.    . clonazePAM (KLONOPIN) 1 MG tablet TAKE ONE (1) TABLET BY MOUTH TWO (2) TIMES DAILY 60 tablet 4  . Cyanocobalamin (VITAMIN B-12) 1000 MCG SUBL Place under the tongue. Vit b-12 1000 mg with Folate 400 mg-Take one daily    . fluticasone (FLONASE) 50 MCG/ACT nasal spray USE 2 SPRAYS INTO BOTH NOSTRILS DAILY (Patient taking differently: as  needed. ) 16 g 2  . glucosamine-chondroitin 500-400 MG tablet Take 1 tablet by mouth daily.    . hydrochlorothiazide (HYDRODIURIL) 25 MG tablet TAKE 1 TABLET BY MOUTH DAILY (Patient taking differently: as needed. ) 90 tablet 0  . OVER THE COUNTER MEDICATION Drinks beet juice daily.    Marland Kitchen OVER THE COUNTER MEDICATION Drinks vegetable juice daily.    Marland Kitchen tiZANidine (ZANAFLEX) 2 MG tablet Take 1 tablet (2 mg total) by mouth every 6 (six) hours as needed for muscle spasms. 30 tablet 5  . traZODone (DESYREL) 50 MG tablet TAKE ONE TABLET BY MOUTH EVERY NIGHT AT BEDTIME 90 tablet 1  . tretinoin (RETIN-A) 0.025 % cream Apply topically at bedtime. 45 g 0  . TURMERIC PO Take by mouth. Turmeric and Ginger -Take one pill tid     No current facility-administered medications on file prior to visit.    Review of Systems  Constitutional: Negative for other unusual diaphoresis or sweats HENT: Negative for ear discharge or swelling Eyes: Negative for other worsening visual disturbances Respiratory: Negative for stridor or other swelling  Gastrointestinal: Negative for worsening distension or other blood Genitourinary: Negative for retention or other urinary change Musculoskeletal: Negative for other MSK pain or swelling Skin: Negative for color change or other new lesions Neurological: Negative for worsening tremors and other numbness  Psychiatric/Behavioral: Negative for worsening agitation or other fatigue All other system neg per pt    Objective:   Physical Exam BP 124/86   Pulse 67   Temp 97.9 F (36.6 C) (Oral)   Ht 5\' 4"  (1.626 m)   Wt 163 lb (73.9 kg)   SpO2 95%   BMI 27.98 kg/m  VS noted,  Constitutional: Pt appears in NAD HENT: Head: NCAT.  Right Ear: External ear normal.  Left Ear: External ear normal.  Eyes: . Pupils are equal, round, and reactive to light. Conjunctivae and EOM are normal Nose: without d/c or deformity Neck: Neck supple. Gross normal ROM Cardiovascular: Normal rate  and regular rhythm.   Pulmonary/Chest: Effort normal and breath sounds without rales or wheezing.  Abd:  Soft, NT, ND, + BS, no organomegaly Neurological: Pt is alert. At baseline orientation, motor grossly intact Skin: Skin is warm. No rashes, other new lesions, no LE edema Psychiatric: Pt behavior is normal without agitation  No other exam findings Lab Results  Component Value Date   WBC 6.1 12/03/2018   HGB 14.5 12/03/2018   HCT 43.2 12/03/2018   PLT 322.0 12/03/2018   GLUCOSE 91 12/03/2018   CHOL 197 12/03/2018   TRIG 66.0 12/03/2018   HDL 70.90 12/03/2018   LDLDIRECT 128.5 07/04/2013   LDLCALC 113 (H) 12/03/2018   ALT 17 12/03/2018   AST 17 12/03/2018   NA 137 12/03/2018   K 3.8 12/03/2018   CL 100 12/03/2018   CREATININE 0.81 12/03/2018   BUN 10 12/03/2018   CO2 28 12/03/2018   TSH  1.71 12/03/2018   INR 0.9 07/17/2009   HGBA1C 5.4 12/03/2018       Assessment & Plan:

## 2019-07-28 NOTE — Patient Instructions (Signed)
Please continue all other medications as before, and refills have been done if requested.  Please have the pharmacy call with any other refills you may need.  Please continue your efforts at being more active, low cholesterol diet, and weight control.  You are otherwise up to date with prevention measures today.  Please keep your appointments with your specialists as you may have planned  Please return in 6 months, or sooner if needed 

## 2019-09-03 ENCOUNTER — Other Ambulatory Visit: Payer: Self-pay

## 2019-09-03 ENCOUNTER — Ambulatory Visit (INDEPENDENT_AMBULATORY_CARE_PROVIDER_SITE_OTHER): Payer: Medicare Other

## 2019-09-03 DIAGNOSIS — Z23 Encounter for immunization: Secondary | ICD-10-CM

## 2019-09-06 DIAGNOSIS — M79672 Pain in left foot: Secondary | ICD-10-CM | POA: Diagnosis not present

## 2019-09-10 ENCOUNTER — Other Ambulatory Visit: Payer: Self-pay | Admitting: Neurology

## 2019-09-12 ENCOUNTER — Telehealth: Payer: Self-pay

## 2019-09-12 ENCOUNTER — Other Ambulatory Visit: Payer: Self-pay | Admitting: *Deleted

## 2019-09-12 DIAGNOSIS — Z20822 Contact with and (suspected) exposure to covid-19: Secondary | ICD-10-CM

## 2019-09-12 DIAGNOSIS — Z20828 Contact with and (suspected) exposure to other viral communicable diseases: Secondary | ICD-10-CM

## 2019-09-12 NOTE — Telephone Encounter (Signed)
Copied from [facility] [id number]. 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  States that she is having no symptoms at this time, but would like to know if she needs to be seen and evaluated or tested. Pt can be reached at [phone]

## 2019-09-12 NOTE — Telephone Encounter (Signed)
Requested Prescriptions   Pending Prescriptions Disp Refills  . traZODone (DESYREL) 50 MG tablet [Pharmacy Med Name: TRAZODONE HCL 50 MG TAB] 90 tablet 1    Sig: TAKE ONE TABLET BY MOUTH EVERY NIGHT AT BEDTIME   Rx last filled:03/08/19 #90 1 refills  Pt last seen: 07/15/19 procedure visit  Follow up appt scheduled:10/14/19

## 2019-09-12 NOTE — Telephone Encounter (Signed)
I have entered lab order for COVID testing  OK for pt to be tested at any of the sites in [place] such as [facility] site at her convenience between 0800 and 330 pm

## 2019-09-12 NOTE — Telephone Encounter (Signed)
Pt has been informed.

## 2019-09-12 NOTE — Addendum Note (Signed)
Addended by: Biagio Borg on: 09/12/2019 01:04 PM   Modules accepted: Orders

## 2019-09-13 LAB — NOVEL CORONAVIRUS, NAA: SARS-CoV-2, NAA: NOT DETECTED

## 2019-09-19 ENCOUNTER — Telehealth: Payer: Self-pay | Admitting: Internal Medicine

## 2019-09-19 NOTE — Telephone Encounter (Signed)
No, it should be ok if the quarantine was for at least 14 days from the onset of the symptoms.  The chance of being able to give to someone else at that point is very very low.  thanks

## 2019-09-19 NOTE — Telephone Encounter (Signed)
This encounter was created in error - please disregard.

## 2019-09-19 NOTE — Telephone Encounter (Signed)
Pt returned vm from [employer]. Unavailable. Please return call.

## 2019-09-19 NOTE — Telephone Encounter (Signed)
Called pt, LVM.   

## 2019-09-19 NOTE — Telephone Encounter (Signed)
Pt has been informed and expressed understandable.

## 2019-09-19 NOTE — Telephone Encounter (Signed)
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  Quarantine  for correct amount time of time.    Patient wants to know if she she have house keeper stay away longer due to the fact that Patients husband has Parkinson DX. Patient would like to hear from Dr. Jenny Reichmann.

## 2019-10-14 ENCOUNTER — Ambulatory Visit (INDEPENDENT_AMBULATORY_CARE_PROVIDER_SITE_OTHER): Payer: Medicare Other | Admitting: Neurology

## 2019-10-14 ENCOUNTER — Other Ambulatory Visit: Payer: Self-pay

## 2019-10-14 DIAGNOSIS — G243 Spasmodic torticollis: Secondary | ICD-10-CM

## 2019-10-14 MED ORDER — ONABOTULINUMTOXINA 100 UNITS IJ SOLR
340.0000 [IU] | Freq: Once | INTRAMUSCULAR | Status: AC
  Administered 2019-10-14: 15:00:00 340 [IU] via INTRAMUSCULAR

## 2019-10-14 NOTE — Procedures (Signed)
[facility]   Procedure Note Botox  Attending: Dr. Wells Guiles [name] Bell  Preoperative Diagnosis(es): Cervical Dystonia  Result History  Onset of effect: immediately Almost no dysphagia.  botox did really well - "best it has been"   Consent obtained from: The patient  Benefits discussed included, but were not limited to decreased muscle tightness, increased joint range of motion, and decreased pain.  Risk discussed included, but were not limited pain and discomfort, bleeding, bruising, excessive weakness, venous thrombosis, muscle atrophy and dysphagia.  A copy of the patient medication guide was given to the patient which explains the blackbox warning.  Informed consent was obtained  Patients identity and treatment sites confirmed yes.     Details of Procedure: Skin was cleaned with alcohol.  A 30 gauge, 1/2 inch needle was introduced to the target muscle (except splenius capitus, posterior approach, where 27 inch, 1 1/2 gauge needle was used).  Prior to injection, the needle plunger was aspirated to make sure the needle was not within a blood vessel.  There was no blood retrieved on aspiration.    Following is a summary of the muscles injected  And the amount of Botulinum toxin used:   Dilution 0.9% preservative free saline mixed with 100 u Botox type A to make 10 U per 0.1cc  Injections  Location Left  Right Units Number of sites        Sternocleidomastoid 60+40 0 100 1  Splenius Capitus, posterior approach 0 80 80 1  Splenius Capitus, lateral approach 0 60 60 1  Levator Scapulae 30  30 0  Trapezius 10/18/19/20  70         TOTAL UNITS:   340    Agent: Botulinum Type A ( Onobotulinum Toxin type A ). 4 vials of Botox were used, containing 100 units and freshly diluted with 1 mL of sterile, non-preserved saline    Total injected (Units): 340  Total wasted (Units): 0   Pt tolerated procedure well without complications.   Reinjection is anticipated in 3 months.

## 2019-10-25 ENCOUNTER — Other Ambulatory Visit: Payer: Self-pay | Admitting: Neurology

## 2019-10-25 ENCOUNTER — Other Ambulatory Visit: Payer: Self-pay

## 2019-10-25 NOTE — Telephone Encounter (Signed)
Patient is at the [place] store @ [phone] and needs a refill on the Clonazepam she is out of the medication. She would like [place] to call it in now because she is there and it take 20 mins to get there from where she lives    Last seen last 10-14-19 for the Botox injection

## 2019-10-25 NOTE — Telephone Encounter (Signed)
Do you have this one for me to sign?

## 2019-10-25 NOTE — Telephone Encounter (Signed)
Do you have this for me to sign?  thanks

## 2019-10-25 NOTE — Telephone Encounter (Signed)
Yes I do, I will bring it to you

## 2019-10-26 ENCOUNTER — Telehealth: Payer: Self-pay | Admitting: Neurology

## 2019-10-26 MED ORDER — CLONAZEPAM 1 MG PO TABS: ORAL_TABLET | ORAL | 1 refills | Status: DC

## 2019-10-26 NOTE — Telephone Encounter (Signed)
I've sent it to the pharmacy.  Let pt know.  There is probably a printed one from [place] around the office.  Please shred

## 2019-10-26 NOTE — Telephone Encounter (Signed)
[name] Bell wrote that order yesterday but hand wrote it.  She did not have me sign it so I'm not sure if the pharmacy got it or not?

## 2019-10-26 NOTE — Telephone Encounter (Signed)
Patient states that she called yesterday about a refill and also the [facility] faxed over a request but has not heard anything from our office.   She needs the Clonazepam  Sent to the [facility] please call patient and let her know when this is done she is out of medication

## 2019-10-26 NOTE — Telephone Encounter (Signed)
Dr. Carles Collet,  Are you ok with sending in medication for pt? If I try to send it it will print and will be waiting on your signature. Pt would like medication ASAP. Thank you.

## 2019-10-31 DIAGNOSIS — L308 Other specified dermatitis: Secondary | ICD-10-CM | POA: Diagnosis not present

## 2019-10-31 DIAGNOSIS — L570 Actinic keratosis: Secondary | ICD-10-CM | POA: Diagnosis not present

## 2019-10-31 DIAGNOSIS — L821 Other seborrheic keratosis: Secondary | ICD-10-CM | POA: Diagnosis not present

## 2019-10-31 DIAGNOSIS — L814 Other melanin hyperpigmentation: Secondary | ICD-10-CM | POA: Diagnosis not present

## 2020-01-11 ENCOUNTER — Encounter: Payer: Self-pay | Admitting: *Deleted

## 2020-01-11 NOTE — Progress Notes (Addendum)
Brendalis, Valentini 01/11/2020 [employer] Benefit Verification 10/14/2019 01/06/2020 [facility] Wells Guiles Tat In Progress None No PA required must be buy and bill Coinsurance 20% Medical plan renewal date 12/29/2020  Letter sent to scan into her chart

## 2020-01-17 ENCOUNTER — Telehealth: Payer: Self-pay | Admitting: Neurology

## 2020-01-17 NOTE — Telephone Encounter (Signed)
We don't really have data on botox and covid vaccine.  However, many neurologists are having patients wait 2 weeks between the 2 in case immune response is so increased that it increases risk of developing antibodies against the botox.  There were some issues with "cosmetic fillers" in the vaccine trials but not with botox.  Okay to delay to feb botox day if they would like.

## 2020-01-17 NOTE — Telephone Encounter (Signed)
Patient said that her and her husband have Botox shots Friday and they are also getting the first round of covid vaccine on Thursday the day before. She was wanting to make sure it is still okay for them to come to appt. Thanks!

## 2020-01-17 NOTE — Telephone Encounter (Signed)
Patient stated that her and her husband will wait on the botox. Both at this time are on 1/22 schedule. Please move them both per Dr Tat to Feb schedule. Please call the patient with new appointment and times for them

## 2020-01-17 NOTE — Telephone Encounter (Signed)
Patient called and left a message requesting a call back about this.

## 2020-01-20 ENCOUNTER — Ambulatory Visit: Payer: Medicare Other | Admitting: Neurology

## 2020-01-25 ENCOUNTER — Other Ambulatory Visit: Payer: Self-pay | Admitting: Neurology

## 2020-01-27 ENCOUNTER — Ambulatory Visit: Payer: Medicare Other | Admitting: Neurology

## 2020-01-28 ENCOUNTER — Other Ambulatory Visit: Payer: Self-pay | Admitting: Internal Medicine

## 2020-01-30 ENCOUNTER — Other Ambulatory Visit: Payer: Self-pay

## 2020-01-30 ENCOUNTER — Encounter: Payer: Self-pay | Admitting: Internal Medicine

## 2020-01-30 ENCOUNTER — Ambulatory Visit (INDEPENDENT_AMBULATORY_CARE_PROVIDER_SITE_OTHER): Payer: Medicare Other | Admitting: Internal Medicine

## 2020-01-30 VITALS — BP 126/88 | HR 62 | Temp 98.4°F | Ht 64.0 in | Wt 174.0 lb

## 2020-01-30 DIAGNOSIS — E559 Vitamin D deficiency, unspecified: Secondary | ICD-10-CM

## 2020-01-30 DIAGNOSIS — E538 Deficiency of other specified B group vitamins: Secondary | ICD-10-CM | POA: Diagnosis not present

## 2020-01-30 DIAGNOSIS — E785 Hyperlipidemia, unspecified: Secondary | ICD-10-CM

## 2020-01-30 DIAGNOSIS — I2583 Coronary atherosclerosis due to lipid rich plaque: Secondary | ICD-10-CM

## 2020-01-30 DIAGNOSIS — R7302 Impaired glucose tolerance (oral): Secondary | ICD-10-CM | POA: Diagnosis not present

## 2020-01-30 DIAGNOSIS — D509 Iron deficiency anemia, unspecified: Secondary | ICD-10-CM | POA: Diagnosis not present

## 2020-01-30 DIAGNOSIS — I2510 Atherosclerotic heart disease of native coronary artery without angina pectoris: Secondary | ICD-10-CM | POA: Diagnosis not present

## 2020-01-30 LAB — BASIC METABOLIC PANEL
BUN: 14 mg/dL (ref 6–23)
CO2: 29 mEq/L (ref 19–32)
Calcium: 9.3 mg/dL (ref 8.4–10.5)
Chloride: 101 mEq/L (ref 96–112)
Creatinine, Ser: 0.77 mg/dL (ref 0.40–1.20)
GFR: 72.75 mL/min (ref >60.00)
Glucose, Bld: 86 mg/dL (ref 70–99)
Potassium: 4 mEq/L (ref 3.5–5.1)
Sodium: 136 mEq/L (ref 135–145)

## 2020-01-30 LAB — CBC WITH DIFFERENTIAL/PLATELET
Basophils Absolute: 0 10*3/uL (ref 0.0–0.1)
Basophils Relative: 0.6 % (ref 0.0–3.0)
Eosinophils Absolute: 0.1 10*3/uL (ref 0.0–0.7)
Eosinophils Relative: 1.6 % (ref 0.0–5.0)
HCT: 39.6 % (ref 36.0–46.0)
Hemoglobin: 13.4 g/dL (ref 12.0–15.0)
Lymphocytes Relative: 27.5 % (ref 12.0–46.0)
Lymphs Abs: 2 10*3/uL (ref 0.7–4.0)
MCHC: 33.7 g/dL (ref 30.0–36.0)
MCV: 96.1 fl (ref 78.0–100.0)
Monocytes Absolute: 0.8 10*3/uL (ref 0.1–1.0)
Monocytes Relative: 11 % (ref 3.0–12.0)
Neutro Abs: 4.3 10*3/uL (ref 1.4–7.7)
Neutrophils Relative %: 59.3 % (ref 43.0–77.0)
Platelets: 319 10*3/uL (ref 150.0–400.0)
RBC: 4.12 Mil/uL (ref 3.87–5.11)
RDW: 13.1 % (ref 11.5–15.5)
WBC: 7.2 10*3/uL (ref 4.0–10.5)

## 2020-01-30 LAB — LIPID PANEL
Cholesterol: 180 mg/dL (ref 0–200)
HDL: 68.3 mg/dL (ref >39.00)
LDL Cholesterol: 100 mg/dL — ABNORMAL HIGH (ref 0–99)
NonHDL: 111.91
Total CHOL/HDL Ratio: 3
Triglycerides: 61 mg/dL (ref 0.0–149.0)
VLDL: 12.2 mg/dL (ref 0.0–40.0)

## 2020-01-30 LAB — HEPATIC FUNCTION PANEL
ALT: 18 U/L (ref 0–35)
AST: 21 U/L (ref 0–37)
Albumin: 4.2 g/dL (ref 3.5–5.2)
Alkaline Phosphatase: 57 U/L (ref 39–117)
Bilirubin, Direct: 0 mg/dL (ref 0.0–0.3)
Total Bilirubin: 0.4 mg/dL (ref 0.2–1.2)
Total Protein: 7.2 g/dL (ref 6.0–8.3)

## 2020-01-30 LAB — FERRITIN: Ferritin: 93.9 ng/mL (ref 10.0–291.0)

## 2020-01-30 LAB — IBC PANEL
Iron: 83 ug/dL (ref 42–145)
Saturation Ratios: 28.8 % (ref 20.0–50.0)
Transferrin: 206 mg/dL — ABNORMAL LOW (ref 212.0–360.0)

## 2020-01-30 LAB — TSH: TSH: 1.58 u[IU]/mL (ref 0.35–4.50)

## 2020-01-30 NOTE — Assessment & Plan Note (Addendum)
Mild elevaaed, for f/u lipids, cont same tx, for cardiac ct scoring  I spent 30 minutes preparing to see the patient by review of recent labs, imaging and procedures, obtaining and reviewing separately obtained history, communicating with the patient and family or caregiver, ordering medications, tests or procedures, and documenting clinical information in the EHR including the differential Dx, treatment, and any further evaluation and other management of HLD, hyperglycemia, anemia

## 2020-01-30 NOTE — Assessment & Plan Note (Signed)
For f/u lab 

## 2020-01-30 NOTE — Patient Instructions (Addendum)
We have discussed the Cardiac CT Score test to measure the calcification level (if any) in your heart arteries.  This test has been ordered in our [facility], so please call [facility] directly, as they prefer this, at [phone] to be scheduled.  Please continue all other medications as before, and refills have been done if requested.  Please have the pharmacy call with any other refills you may need.  Please continue your efforts at being more active, low cholesterol diet, and weight control.  You are otherwise up to date with prevention measures today.  Please keep your appointments with your specialists as you may have planned  Please go to the LAB at the blood drawing area for the tests to be done  You will be contacted by phone if any changes need to be made immediately.  Otherwise, you will receive a letter about your results with an explanation, but please check with MyChart first.  Please remember to sign up for MyChart if you have not done so, as this will be important to you in the future with finding out test results, communicating by private email, and scheduling acute appointments online when needed.  Please make an Appointment to return in 6 months, or sooner if needed

## 2020-01-30 NOTE — Assessment & Plan Note (Signed)
stable overall by history and exam, recent data reviewed with pt, and pt to continue medical treatment as before,  to f/u any worsening symptoms or concerns  

## 2020-01-30 NOTE — Progress Notes (Signed)
Subjective:    Patient ID: [name] Bell, female    DOB: 1941/07/27, 79 y.o.   MRN: [id number]  HPI  Here to f/u; overall doing ok,  Pt denies chest pain, increasing sob or doe, wheezing, orthopnea, PND, increased LE swelling, palpitations, dizziness or syncope.  Pt denies new neurological symptoms such as new headache, or facial or extremity weakness or numbness.  Pt denies polydipsia, polyuria, or low sugar episode.  Pt states overall good compliance with meds, mostly trying to follow appropriate diet, with wt overall stable Wt Readings from Last 3 Encounters:  01/30/20 174 lb (78.9 kg)  07/28/19 163 lb (73.9 kg)  01/17/19 174 lb (78.9 kg)   BP Readings from Last 3 Encounters:  01/30/20 126/88  07/28/19 124/86  01/17/19 (!) 142/90   Past Medical History:  Diagnosis Date  . 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 ALLERGIC RHINITIS 01/24/2008  . Allergy   . ANEMIA-IRON DEFICIENCY 01/24/2008   pt denied  . ANXIETY 02/26/2009  . BACK PAIN 09/30/2010  . Breast cancer ([place])   . Cancer of central portion of female breast ([place]) 05/04/2015   right breast/ no chemo or radiation done  . DIZZINESS 02/28/2010  . ELEVATED BLOOD PRESSURE WITHOUT DIAGNOSIS OF HYPERTENSION 02/26/2009   patient denies  . FATIGUE 02/26/2009  . GERD 01/24/2008  . History of blood transfusion ectopic pregnancy  . Neuromuscular disorder ([place])   . SHINGLES 11/06/2010  . TOE PAIN 09/30/2010  . VARICOSE VEINS, LOWER EXTREMITIES 08/16/2008  . 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 AUGMENTATION MAMMAPLASTY Bilateral 10/09/2016  . BACK SURGERY  2010   fusion  . BREAST LUMPECTOMY Right 05/14/2015   Procedure: RIGHT BREAST LUMPECTOMY;  Surgeon: Alphonsa Overall, MD;  Location: [facility];  Service: General;  Laterality: Right;  . BREAST RECONSTRUCTION WITH PLACEMENT OF TISSUE EXPANDER AND FLEX HD (ACELLULAR HYDRATED DERMIS) Right 10/09/2016   Procedure: IMMEDIATE  RIGHT BREAST RECONSTRUCTION WITH PLACEMENT OF SILICONE GEL IMPLANTS OR TISSUE EXPANDER AND ACELLULAR DERMAL MATRIX;  Surgeon: Crissie Reese, MD;  Location: [facility];  Service: Plastics;  Laterality: Right;  . ECTOPIC PREGNANCY SURGERY     at 79 yo with salpingectomy  . FOOT SURGERY Right    bunion, hammer toe.  x 2  . FOOT SURGERY Right 05/2016   3 surgeries on right foot.  . INCISION AND DRAINAGE OF WOUND Right 10/09/2016   Procedure: IRRIGATION AND DEBRIDEMENT BREAST HEMATOMA;  Surgeon: Crissie Reese, MD;  Location: [facility];  Service: Plastics;  Laterality: Right;  . MASTECTOMY Right 10/09/2016  . MASTECTOMY W/ SENTINEL NODE BIOPSY Right 10/09/2016  . MASTECTOMY W/ SENTINEL NODE BIOPSY Right 10/09/2016   Procedure: RIGHT MASTECTOMY WITH SENTINEL LYMPH NODE BIOPSY;  Surgeon: Alphonsa Overall, MD;  Location: [facility];  Service: General;  Laterality: Right;  . s/p bilateral breasts implants  12/2007  . s/p lumbar surgury  july 2010  . TONSILLECTOMY      reports that she has never smoked. She has never used smokeless tobacco. She reports current alcohol use of about 6.0 standard drinks of alcohol per week. She reports that she does not use drugs. family history includes Asthma in her brother; Lung cancer in her father. Allergies  Allergen Reactions  . Advil [Ibuprofen]     Per pt, makes her feel "confused when she takes Advil with meds  . Iron     Oxidized iron-makes hand and body break out when  touched.  . No Known Allergies    Current Outpatient Medications on File Prior to Visit  Medication Sig Dispense Refill  . acetaminophen (TYLENOL) 325 MG tablet Take 650 mg by mouth every 6 (six) hours as needed.    . Biotin 1000 MCG tablet Take 1,000 mcg by mouth daily.    . clonazePAM (KLONOPIN) 1 MG tablet TAKE ONE (1) TABLET BY MOUTH TWO (2) TIMES DAILY 180 tablet 1  . Cyanocobalamin (VITAMIN B-12) 1000 MCG SUBL Place under the tongue. Vit b-12 1000 mg with Folate 400 mg-Take one daily    .  hydrochlorothiazide (HYDRODIURIL) 25 MG tablet TAKE 1 TABLET BY MOUTH DAILY (Patient taking differently: as needed. ) 90 tablet 0  . OVER THE COUNTER MEDICATION Drinks beet juice daily.    Marland Kitchen OVER THE COUNTER MEDICATION Drinks vegetable juice daily.    Marland Kitchen RETIN-A 0.05 % cream APPLY TO AFFECTED AREA(S) AT BEDTIME. 45 g 1  . tiZANidine (ZANAFLEX) 2 MG tablet Take 1 tablet (2 mg total) by mouth every 6 (six) hours as needed for muscle spasms. 30 tablet 5  . traZODone (DESYREL) 50 MG tablet TAKE ONE TABLET BY MOUTH EVERY NIGHT AT BEDTIME 90 tablet 1  . tretinoin (RETIN-A) 0.025 % cream Apply topically at bedtime. 45 g 0  . TURMERIC PO Take by mouth. Turmeric and Ginger -Take one pill tid    . fluticasone (FLONASE) 50 MCG/ACT nasal spray USE 2 SPRAYS INTO BOTH NOSTRILS DAILY (Patient not taking: No sig reported) 16 g 2  . glucosamine-chondroitin 500-400 MG tablet Take 1 tablet by mouth daily.     No current facility-administered medications on file prior to visit.   Review of Systems All otherwise neg per pt     Objective:   Physical Exam BP 126/88   Pulse 62   Temp 98.4 F (36.9 C)   Ht 5\' 4"  (1.626 m)   Wt 174 lb (78.9 kg)   SpO2 99%   BMI 29.87 kg/m  VS noted,  Constitutional: Pt appears in NAD HENT: Head: NCAT.  Right Ear: External ear normal.  Left Ear: External ear normal.  Eyes: . Pupils are equal, round, and reactive to light. Conjunctivae and EOM are normal Nose: without d/c or deformity Neck: Neck supple. Gross normal ROM Cardiovascular: Normal rate and regular rhythm.   Pulmonary/Chest: Effort normal and breath sounds without rales or wheezing.  Abd:  Soft, NT, ND, + BS, no organomegaly Neurological: Pt is alert. At baseline orientation, motor grossly intact Skin: Skin is warm. No rashes, other new lesions, no LE edema Psychiatric: Pt behavior is normal without agitation  All otherwise neg per pt Lab Results  Component Value Date   WBC 6.1 12/03/2018   HGB 14.5  12/03/2018   HCT 43.2 12/03/2018   PLT 322.0 12/03/2018   GLUCOSE 91 12/03/2018   CHOL 197 12/03/2018   TRIG 66.0 12/03/2018   HDL 70.90 12/03/2018   LDLDIRECT 128.5 07/04/2013   LDLCALC 113 (H) 12/03/2018   ALT 17 12/03/2018   AST 17 12/03/2018   NA 137 12/03/2018   K 3.8 12/03/2018   CL 100 12/03/2018   CREATININE 0.81 12/03/2018   BUN 10 12/03/2018   CO2 28 12/03/2018   TSH 1.71 12/03/2018   INR 0.9 07/17/2009   HGBA1C 5.4 12/03/2018         Assessment & Plan:

## 2020-01-31 ENCOUNTER — Encounter: Payer: Self-pay | Admitting: Internal Medicine

## 2020-01-31 ENCOUNTER — Telehealth: Payer: Self-pay | Admitting: Internal Medicine

## 2020-01-31 ENCOUNTER — Other Ambulatory Visit: Payer: Self-pay | Admitting: Internal Medicine

## 2020-01-31 LAB — URINALYSIS, ROUTINE W REFLEX MICROSCOPIC
Bilirubin Urine: NEGATIVE
Hgb urine dipstick: NEGATIVE
Ketones, ur: NEGATIVE
Nitrite: NEGATIVE
RBC / HPF: NONE SEEN (ref 0–?)
Specific Gravity, Urine: 1.01 (ref 1.000–1.030)
Total Protein, Urine: NEGATIVE
Urine Glucose: NEGATIVE
Urobilinogen, UA: 0.2 (ref 0.0–1.0)
pH: 6.5 (ref 5.0–8.0)

## 2020-01-31 LAB — VITAMIN D 25 HYDROXY (VIT D DEFICIENCY, FRACTURES): VITD: 23.51 ng/mL — ABNORMAL LOW (ref 30.00–100.00)

## 2020-01-31 LAB — HEMOGLOBIN A1C: Hgb A1c MFr Bld: 5.3 % (ref 4.6–6.5)

## 2020-01-31 LAB — VITAMIN B12: Vitamin B-12: 879 pg/mL (ref 211–911)

## 2020-01-31 MED ORDER — VITAMIN D (ERGOCALCIFEROL) 1.25 MG (50000 UNIT) PO CAPS: 50000.0000 [IU] | ORAL_CAPSULE | ORAL | 0 refills | Status: DC

## 2020-01-31 NOTE — Telephone Encounter (Signed)
Please return  call to discuss lab results 

## 2020-02-01 NOTE — Telephone Encounter (Signed)
Spoke with patient on 01-31-20 and discuss labs

## 2020-02-06 ENCOUNTER — Telehealth: Payer: Self-pay | Admitting: Internal Medicine

## 2020-02-06 ENCOUNTER — Telehealth: Payer: Self-pay

## 2020-02-06 MED ORDER — VITAMIN D (ERGOCALCIFEROL) 1.25 MG (50000 UNIT) PO CAPS: 50000.0000 [IU] | ORAL_CAPSULE | ORAL | 0 refills | Status: DC

## 2020-02-06 NOTE — Telephone Encounter (Signed)
Pt has been informed to take medication

## 2020-02-06 NOTE — Telephone Encounter (Signed)
New message    Medication Requested:  Is medication on med list (if no, inform pt they may need an appointment): Vitamin D, Ergocalciferol, (DRISDOL) 1.25 MG (50000 UNIT) CAPS capsule  Is medication a controled (yes = last OV with PCP):   Is the OV > than 4 months (yes = schedule an appt if one is not already made):   Pharmacy (Name [place]):[facility] [phone]

## 2020-02-06 NOTE — Telephone Encounter (Signed)
Patient states she is scheduled for her 2nd [place] this Thursday.  Wants to know if she should take her Vit D that day or hold off.

## 2020-02-10 ENCOUNTER — Ambulatory Visit: Payer: Medicare Other | Admitting: Neurology

## 2020-02-16 ENCOUNTER — Inpatient Hospital Stay: Admission: RE | Admit: 2020-02-16 | Payer: Medicare Other | Source: Ambulatory Visit

## 2020-02-24 ENCOUNTER — Ambulatory Visit (INDEPENDENT_AMBULATORY_CARE_PROVIDER_SITE_OTHER): Payer: Medicare Other | Admitting: Neurology

## 2020-02-24 ENCOUNTER — Other Ambulatory Visit: Payer: Self-pay

## 2020-02-24 DIAGNOSIS — G243 Spasmodic torticollis: Secondary | ICD-10-CM | POA: Diagnosis not present

## 2020-02-24 MED ORDER — ONABOTULINUMTOXINA 100 UNITS IJ SOLR
350.0000 [IU] | Freq: Once | INTRAMUSCULAR | Status: AC
  Administered 2020-02-24: 350 [IU] via INTRAMUSCULAR

## 2020-02-24 MED ORDER — ONABOTULINUMTOXINA 100 UNITS IJ SOLR: 100.0000 [IU] | Freq: Once | INTRAMUSCULAR | Status: DC

## 2020-02-24 MED ORDER — TRAZODONE HCL 50 MG PO TABS: 50.0000 mg | ORAL_TABLET | Freq: Every day | ORAL | 1 refills | Status: DC

## 2020-02-24 NOTE — Procedures (Signed)
[facility]   Procedure Note Botox  Attending: Dr. Wells Guiles Makaila Windle  Preoperative Diagnosis(es): Cervical Dystonia  Result History  Onset of effect: immediately More worn off b/c late for injections b/c of covid vaccine (waited few weeks after had vaccine)   Consent obtained from: The patient  Benefits discussed included, but were not limited to decreased muscle tightness, increased joint range of motion, and decreased pain.  Risk discussed included, but were not limited pain and discomfort, bleeding, bruising, excessive weakness, venous thrombosis, muscle atrophy and dysphagia.  A copy of the patient medication guide was given to the patient which explains the blackbox warning.  Informed consent was obtained  Patients identity and treatment sites confirmed yes.     Details of Procedure: Skin was cleaned with alcohol.  A 30 gauge, 1/2 inch needle was introduced to the target muscle (except splenius capitus, posterior approach, where 27 inch, 1 1/2 gauge needle was used).  Prior to injection, the needle plunger was aspirated to make sure the needle was not within a blood vessel.  There was no blood retrieved on aspiration.    Following is a summary of the muscles injected  And the amount of Botulinum toxin used:   Dilution 0.9% preservative free saline mixed with 100 u Botox type A to make 10 U per 0.1cc  Injections  Location Left  Right Units Number of sites        Sternocleidomastoid 60+40 0 100 1  Splenius Capitus, posterior approach 0 80 80 1  Splenius Capitus, lateral approach 0 60 60 1  Levator Scapulae 30  30 0  Trapezius 10/18/19/20  70         TOTAL UNITS:   340    Agent: Botulinum Type A ( Onobotulinum Toxin type A ). 4 vials of Botox were used, containing 100 units and freshly diluted with 1 mL of sterile, non-preserved saline    Total injected (Units): 340  Total wasted (Units): 10   Pt tolerated procedure well without complications.   Reinjection is  anticipated in 3 months. Clinical comment: pt asks for RF of trazodone.  Uses for sleep given neck pain at night.  RF

## 2020-02-27 ENCOUNTER — Telehealth: Payer: Self-pay | Admitting: Neurology

## 2020-02-27 NOTE — Telephone Encounter (Signed)
This is incidental and has nothing to do with botox or the interaction with a vaccine.  We separated it from her vaccine for 2 weeks for a reason

## 2020-02-27 NOTE — Telephone Encounter (Signed)
Patient called to report concerns she had Botox on Friday, 02/24/20, and yesterday morning she came down with a headache, eye pain, and no appetite. She said she is staying in bed because it hurts to move.  Patient expressed concerns about possible interactions with Botox and her covid-19 vaccines she received.

## 2020-02-27 NOTE — Telephone Encounter (Signed)
Patient states she still hs a headache, but yesterday was worse. She states she is taking extra strength tylenol and that is not helping. She wanted to know if there was another medication she can take to get rid of the headache. She states she gets headaches after Botox but this was too much.

## 2020-02-27 NOTE — Telephone Encounter (Signed)
She can try OTC aleve.

## 2020-02-27 NOTE — Telephone Encounter (Signed)
Is there anything special I need to ask her?

## 2020-02-28 ENCOUNTER — Ambulatory Visit
Admission: RE | Admit: 2020-02-28 | Discharge: 2020-02-28 | Disposition: A | Discharge status: Home / Self Care | Payer: Self-pay | Source: Ambulatory Visit | Attending: Internal Medicine | Admitting: Internal Medicine

## 2020-02-28 ENCOUNTER — Other Ambulatory Visit: Payer: Self-pay

## 2020-02-28 DIAGNOSIS — I2510 Atherosclerotic heart disease of native coronary artery without angina pectoris: Secondary | ICD-10-CM

## 2020-02-28 DIAGNOSIS — I2583 Coronary atherosclerosis due to lipid rich plaque: Secondary | ICD-10-CM

## 2020-02-28 DIAGNOSIS — E785 Hyperlipidemia, unspecified: Secondary | ICD-10-CM

## 2020-02-28 NOTE — Telephone Encounter (Signed)
Patient states she can only tolerate is Tylenol because Aleve makes the headache worse.

## 2020-02-29 DIAGNOSIS — R188 Other ascites: Secondary | ICD-10-CM | POA: Diagnosis not present

## 2020-02-29 DIAGNOSIS — K5730 Diverticulosis of large intestine without perforation or abscess without bleeding: Secondary | ICD-10-CM | POA: Diagnosis not present

## 2020-02-29 DIAGNOSIS — R112 Nausea with vomiting, unspecified: Secondary | ICD-10-CM | POA: Diagnosis not present

## 2020-02-29 DIAGNOSIS — M4856XA Collapsed vertebra, not elsewhere classified, lumbar region, initial encounter for fracture: Secondary | ICD-10-CM | POA: Diagnosis not present

## 2020-02-29 DIAGNOSIS — R109 Unspecified abdominal pain: Secondary | ICD-10-CM | POA: Diagnosis not present

## 2020-02-29 DIAGNOSIS — K449 Diaphragmatic hernia without obstruction or gangrene: Secondary | ICD-10-CM | POA: Diagnosis not present

## 2020-02-29 DIAGNOSIS — R1084 Generalized abdominal pain: Secondary | ICD-10-CM | POA: Diagnosis not present

## 2020-03-02 ENCOUNTER — Telehealth: Payer: Self-pay

## 2020-03-02 NOTE — Telephone Encounter (Signed)
   Patient calling states she was seen in ED on 3/3. She would like Dr Jenny Reichmann to be made aware and review ED. Declined appointment at this time.

## 2020-03-08 ENCOUNTER — Encounter: Payer: Self-pay | Admitting: Internal Medicine

## 2020-03-08 ENCOUNTER — Ambulatory Visit (INDEPENDENT_AMBULATORY_CARE_PROVIDER_SITE_OTHER): Payer: Medicare Other | Admitting: Internal Medicine

## 2020-03-08 ENCOUNTER — Other Ambulatory Visit: Payer: Self-pay

## 2020-03-08 VITALS — BP 140/94 | HR 60 | Temp 97.5°F | Ht 64.0 in | Wt 173.0 lb

## 2020-03-08 DIAGNOSIS — I2510 Atherosclerotic heart disease of native coronary artery without angina pectoris: Secondary | ICD-10-CM

## 2020-03-08 DIAGNOSIS — R109 Unspecified abdominal pain: Secondary | ICD-10-CM | POA: Insufficient documentation

## 2020-03-08 DIAGNOSIS — R030 Elevated blood-pressure reading, without diagnosis of hypertension: Secondary | ICD-10-CM

## 2020-03-08 DIAGNOSIS — N83209 Unspecified ovarian cyst, unspecified side: Secondary | ICD-10-CM

## 2020-03-08 DIAGNOSIS — R1084 Generalized abdominal pain: Secondary | ICD-10-CM

## 2020-03-08 DIAGNOSIS — I2583 Coronary atherosclerosis due to lipid rich plaque: Secondary | ICD-10-CM | POA: Diagnosis not present

## 2020-03-08 NOTE — Assessment & Plan Note (Signed)
Incidental, pt reqeusts GYN referral

## 2020-03-08 NOTE — Assessment & Plan Note (Signed)
Cont to monitor, decliens tx for now

## 2020-03-08 NOTE — Assessment & Plan Note (Addendum)
Etiology unclear, for referral GI - ? Need egd  I spent 31 minutes in preparing to see the patient by review of recent labs, imaging and procedures, obtaining and reviewing separately obtained history, communicating with the patient and family or caregiver, ordering medications, tests or procedures, and documenting clinical information in the EHR including the differential Dx, treatment, and any further evaluation and other management of abd pain, elevated BP, ovary cyst

## 2020-03-08 NOTE — Progress Notes (Signed)
Subjective:    Patient ID: Geralynn Ochs, female    DOB: [birth year], 79 y.o.   MRN: [id number]  HPI  Here after seen at ED at [facility] Feb 29 2020 -  Chief Complaint  Patient presents with  . Abdominal Pain   Abdominal Pain Pain location: Generalized (mostly lower) Pain quality: cramping and shooting  Pain radiates to: LLQ and RLQ Pain severity: Severe Onset quality: Sudden Duration: 7 hours Timing: Constant Progression: Worsening Chronicity: New Context: suspicious food intake (she is concerned she has food poisoning from chicken she ate)  Context: not recent illness and not sick contacts  Relieved by: None tried Worsened by: Nothing Ineffective treatments: Lying down Associated symptoms: chills, nausea and vomiting (x 10)  Associated symptoms: no constipation, no diarrhea, no dysuria and no fever  MDM 79 year old female comes in with vomiting and diffuse abdominal pain. She says it feels like she has "food poisoning". She was given IV fluids, morphine and Compazine with significant improvement and was able to tolerate p.o. after this. Her lab work is reassuring. CT scan only shows incidental findings and no cause for her vomiting. She does have incidental adnexal cyst which she was informed of and the need for follow-up. She was provided with a prescription for Zofran, return precautions including intractable vomiting, worsening abdominal pain or fevers were discussed. She stable for discharge home. Pt ha tried gaviscon no help, nor tylenl prior to calling ambulance.  Since the ED still has some pain, not as intense or severe but still persistent to mid and upper abd.  No further vomiting, though states no nausea even with the vomiting.  .  BP Readings from Last 3 Encounters:  03/08/20 (!) 140/94  01/30/20 126/88  07/28/19 124/86   Past Medical History:  Diagnosis Date  . 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 ALLERGIC RHINITIS 01/24/2008  . Allergy   .  ANEMIA-IRON DEFICIENCY 01/24/2008   pt denied  . ANXIETY 02/26/2009  . BACK PAIN 09/30/2010  . Breast cancer ([place])   . Cancer of central portion of female breast ([place]) 05/04/2015   right breast/ no chemo or radiation done  . DIZZINESS 02/28/2010  . ELEVATED BLOOD PRESSURE WITHOUT DIAGNOSIS OF HYPERTENSION 02/26/2009   patient denies  . FATIGUE 02/26/2009  . GERD 01/24/2008  . History of blood transfusion ectopic pregnancy  . Neuromuscular disorder ([place])   . SHINGLES 11/06/2010  . TOE PAIN 09/30/2010  . VARICOSE VEINS, LOWER EXTREMITIES 08/16/2008  . 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 AUGMENTATION MAMMAPLASTY Bilateral 10/09/2016  . BACK SURGERY  2010   fusion  . BREAST LUMPECTOMY Right 05/14/2015   Procedure: RIGHT BREAST LUMPECTOMY;  Surgeon: Alphonsa Overall, MD;  Location: [facility];  Service: General;  Laterality: Right;  . BREAST RECONSTRUCTION WITH PLACEMENT OF TISSUE EXPANDER AND FLEX HD (ACELLULAR HYDRATED DERMIS) Right 10/09/2016   Procedure: IMMEDIATE RIGHT BREAST RECONSTRUCTION WITH PLACEMENT OF SILICONE GEL IMPLANTS OR TISSUE EXPANDER AND ACELLULAR DERMAL MATRIX;  Surgeon: Crissie Reese, MD;  Location: [facility];  Service: Plastics;  Laterality: Right;  . ECTOPIC PREGNANCY SURGERY     at 79 yo with salpingectomy  . FOOT SURGERY Right    bunion, hammer toe.  x 2  . FOOT SURGERY Right 05/2016   3 surgeries on right foot.  . INCISION AND DRAINAGE OF WOUND Right 10/09/2016   Procedure: IRRIGATION AND DEBRIDEMENT BREAST HEMATOMA;  Surgeon: Crissie Reese, MD;  Location:  [facility];  Service: Plastics;  Laterality: Right;  . MASTECTOMY Right 10/09/2016  . MASTECTOMY W/ SENTINEL NODE BIOPSY Right 10/09/2016  . MASTECTOMY W/ SENTINEL NODE BIOPSY Right 10/09/2016   Procedure: RIGHT MASTECTOMY WITH SENTINEL LYMPH NODE BIOPSY;  Surgeon: Alphonsa Overall, MD;  Location: [facility];  Service: General;  Laterality: Right;  . s/p bilateral breasts implants  12/2007  .  s/p lumbar surgury  july 2010  . TONSILLECTOMY      reports that she has never smoked. She has never used smokeless tobacco. She reports current alcohol use of about 6.0 standard drinks of alcohol per week. She reports that she does not use drugs. family history includes Asthma in her brother; Lung cancer in her father. Allergies  Allergen Reactions  . Advil [Ibuprofen]     Per pt, makes her feel "confused when she takes Advil with meds  . Iron     Oxidized iron-makes hand and body break out when touched.  . No Known Allergies    Current Outpatient Medications on File Prior to Visit  Medication Sig Dispense Refill  . acetaminophen (TYLENOL) 325 MG tablet Take 650 mg by mouth every 6 (six) hours as needed.    . clonazePAM (KLONOPIN) 1 MG tablet TAKE ONE (1) TABLET BY MOUTH TWO (2) TIMES DAILY 180 tablet 1  . Cyanocobalamin (VITAMIN B-12) 1000 MCG SUBL Place under the tongue. Vit b-12 1000 mg with Folate 400 mg-Take one daily    . diazepam (VALIUM) 5 MG tablet Take 5 mg by mouth every 6 (six) hours as needed for anxiety.    . docusate sodium (COLACE) 100 MG capsule Take 100 mg by mouth 2 (two) times daily.    . fluticasone (FLONASE) 50 MCG/ACT nasal spray USE 2 SPRAYS INTO BOTH NOSTRILS DAILY 16 g 2  . glucosamine-chondroitin 500-400 MG tablet Take 1 tablet by mouth daily.    . hydrochlorothiazide (HYDRODIURIL) 25 MG tablet TAKE 1 TABLET BY MOUTH DAILY (Patient taking differently: as needed. ) 90 tablet 0  . OVER THE COUNTER MEDICATION Drinks beet juice daily.    Marland Kitchen OVER THE COUNTER MEDICATION Drinks vegetable juice daily.    Marland Kitchen RETIN-A 0.05 % cream APPLY TO AFFECTED AREA(S) AT BEDTIME. 45 g 1  . tiZANidine (ZANAFLEX) 2 MG tablet Take 1 tablet (2 mg total) by mouth every 6 (six) hours as needed for muscle spasms. 30 tablet 5  . traZODone (DESYREL) 50 MG tablet Take 1 tablet (50 mg total) by mouth at bedtime. 90 tablet 1  . tretinoin (RETIN-A) 0.025 % cream Apply topically at bedtime. 45 g 0    . TURMERIC PO Take by mouth. Turmeric and Ginger -Take one pill tid    . Vitamin D, Ergocalciferol, (DRISDOL) 1.25 MG (50000 UNIT) CAPS capsule Take 1 capsule (50,000 Units total) by mouth every 7 (seven) days. 12 capsule 0   No current facility-administered medications on file prior to visit.   Review of Systems All otherwise neg per pt     Objective:   Physical Exam BP (!) 140/94   Pulse 60   Temp (!) 97.5 F (36.4 C)   Ht 5\' 4"  (1.626 m)   Wt 173 lb (78.5 kg)   SpO2 99%   BMI 29.70 kg/m  VS noted,  Constitutional: Pt appears in NAD HENT: Head: NCAT.  Right Ear: External ear normal.  Left Ear: External ear normal.  Eyes: . Pupils are equal, round, and reactive to light. Conjunctivae and EOM are normal  Nose: without d/c or deformity Neck: Neck supple. Gross normal ROM Cardiovascular: Normal rate and regular rhythm.   Pulmonary/Chest: Effort normal and breath sounds without rales or wheezing.  Abd:  Soft, NT, ND, + BS, no organomegaly Neurological: Pt is alert. At baseline orientation, motor grossly intact Skin: Skin is warm. No rashes, other new lesions, no LE edema Psychiatric: Pt behavior is normal without agitation  All otherwise neg per pt Lab Results  Component Value Date   WBC 7.2 01/30/2020   HGB 13.4 01/30/2020   HCT 39.6 01/30/2020   PLT 319.0 01/30/2020   GLUCOSE 86 01/30/2020   CHOL 180 01/30/2020   TRIG 61.0 01/30/2020   HDL 68.30 01/30/2020   LDLDIRECT 128.5 07/04/2013   LDLCALC 100 (H) 01/30/2020   ALT 18 01/30/2020   AST 21 01/30/2020   NA 136 01/30/2020   K 4.0 01/30/2020   CL 101 01/30/2020   CREATININE 0.77 01/30/2020   BUN 14 01/30/2020   CO2 29 01/30/2020   TSH 1.58 01/30/2020   INR 0.9 07/17/2009   HGBA1C 5.3 01/30/2020        Assessment & Plan:

## 2020-03-08 NOTE — Patient Instructions (Signed)
Please continue all other medications as before, and refills have been done if requested.  Please have the pharmacy call with any other refills you may need.  Please continue your efforts at being more active, low cholesterol diet, and weight control.  Please keep your appointments with your specialists as you may have planned  You will be contacted regarding the referral for: Dr Loletha Carrow, and GYN

## 2020-04-11 ENCOUNTER — Encounter: Payer: Self-pay | Admitting: Internal Medicine

## 2020-04-12 ENCOUNTER — Ambulatory Visit (INDEPENDENT_AMBULATORY_CARE_PROVIDER_SITE_OTHER): Payer: Medicare Other | Admitting: Gastroenterology

## 2020-04-12 ENCOUNTER — Encounter: Payer: Self-pay | Admitting: Gastroenterology

## 2020-04-12 VITALS — BP 138/84 | HR 66 | Temp 97.8°F | Ht 64.0 in | Wt 167.0 lb

## 2020-04-12 DIAGNOSIS — K529 Noninfective gastroenteritis and colitis, unspecified: Secondary | ICD-10-CM

## 2020-04-12 DIAGNOSIS — I2510 Atherosclerotic heart disease of native coronary artery without angina pectoris: Secondary | ICD-10-CM | POA: Diagnosis not present

## 2020-04-12 DIAGNOSIS — I2583 Coronary atherosclerosis due to lipid rich plaque: Secondary | ICD-10-CM

## 2020-04-12 DIAGNOSIS — R933 Abnormal findings on diagnostic imaging of other parts of digestive tract: Secondary | ICD-10-CM

## 2020-04-12 DIAGNOSIS — Z86010 Personal history of colonic polyps: Secondary | ICD-10-CM

## 2020-04-12 NOTE — Progress Notes (Signed)
[facility] Progress Note  Chief Complaint: Acute gastroenteritis  Subjective  History:  Screening colonoscopy January 2020 with melanosis throughout the colon,, several diminutive polyps and a few sigmoid diverticuli.  Polyps were serrated, hyperplastic and adenomatous.  No recall recommended based on age and current guidelines.  She was seen at a [facility] ED in [facility] on 02/29/2020 for acute onset crampy lower abdominal pain that began that day, there was associated chills nausea and vomiting.  Provider note from that visit reviewed, and testing as noted below.  [name] Bell reports that the symptoms came on suddenly that day.  She and her husband thought it may have been some prepared chicken they purchased at the supermarket, but he did not get sick.  She had severe crampy abdominal pain with protracted vomiting without hematemesis.  She did not get diarrhea.  Throat was sore for several days afterwards.  Symptoms have all completely resolved. She saw primary care in follow-up and was advised to see [place] regarding those symptoms and some CT findings. ROS: Cardiovascular:  no chest pain Respiratory: no dyspnea Spastic torticollis, gets periodic Botox injection Seasonal allergies Remainder systems negative except as above  The patient's Past Medical, Family and Social History were reviewed and are on file in the EMR.  Objective:  Med list reviewed  Current Outpatient Medications:  .  acetaminophen (TYLENOL) 325 MG tablet, Take 650 mg by mouth every 6 (six) hours as needed., Disp: , Rfl:  .  clonazePAM (KLONOPIN) 1 MG tablet, TAKE ONE (1) TABLET BY MOUTH TWO (2) TIMES DAILY, Disp: 180 tablet, Rfl: 1 .  Cyanocobalamin (VITAMIN B-12) 1000 MCG SUBL, Place under the tongue. Vit b-12 1000 mg with Folate 400 mg-Take one daily, Disp: , Rfl:  .  diazepam (VALIUM) 5 MG tablet, Take 5 mg by mouth every 6 (six) hours as needed for anxiety., Disp: , Rfl:  .  docusate sodium  (COLACE) 100 MG capsule, Take 100 mg by mouth 2 (two) times daily., Disp: , Rfl:  .  fluticasone (FLONASE) 50 MCG/ACT nasal spray, USE 2 SPRAYS INTO BOTH NOSTRILS DAILY, Disp: 16 g, Rfl: 2 .  glucosamine-chondroitin 500-400 MG tablet, Take 1 tablet by mouth daily., Disp: , Rfl:  .  hydrochlorothiazide (HYDRODIURIL) 25 MG tablet, TAKE 1 TABLET BY MOUTH DAILY (Patient taking differently: as needed. ), Disp: 90 tablet, Rfl: 0 .  OVER THE COUNTER MEDICATION, Drinks beet juice daily., Disp: , Rfl:  .  OVER THE COUNTER MEDICATION, Drinks vegetable juice daily., Disp: , Rfl:  .  RETIN-A 0.05 % cream, APPLY TO AFFECTED AREA(S) AT BEDTIME., Disp: 45 g, Rfl: 1 .  tiZANidine (ZANAFLEX) 2 MG tablet, Take 1 tablet (2 mg total) by mouth every 6 (six) hours as needed for muscle spasms., Disp: 30 tablet, Rfl: 5 .  traZODone (DESYREL) 50 MG tablet, Take 1 tablet (50 mg total) by mouth at bedtime., Disp: 90 tablet, Rfl: 1 .  tretinoin (RETIN-A) 0.025 % cream, Apply topically at bedtime., Disp: 45 g, Rfl: 0 .  TURMERIC PO, Take by mouth. Turmeric and Ginger -Take one pill tid, Disp: , Rfl:  .  Vitamin D, Ergocalciferol, (DRISDOL) 1.25 MG (50000 UNIT) CAPS capsule, Take 1 capsule (50,000 Units total) by mouth every 7 (seven) days., Disp: 12 capsule, Rfl: 0   Vital signs in last 24 hrs: Vitals:   04/12/20 0918  BP: 138/84  Pulse: 66  Temp: 97.8 F (36.6 C)    Physical Exam  Well-appearing,  normal vocal quality  HEENT: sclera anicteric, oral mucosa moist without lesions  Neck: supple, no thyromegaly, JVD or lymphadenopathy  Cardiac: RRR without murmurs, S1S2 heard, no peripheral edema  Pulm: clear to auscultation bilaterally, normal RR and effort noted  Abdomen: soft, no tenderness, with active bowel sounds. No guarding or palpable hepatosplenomegaly.  Skin; warm and dry, no jaundice or rash  Labs:  Labs from ED visit: WBC 10.5, hemoglobin 14, platelets 286 Lipase 24 Sodium 132, chloride 97,  bicarb 25, potassium 4.0, creatinine 0.5, LFTs normal ___________________________________________ Radiologic studies:  CT scan report from ED visit: FINDINGS:     LOWER CHEST:  . Mediastinum: Mild circumferential wall thickening of the distal esophagus with small hiatal hernia.   Marland Kitchen Heart/vessel: Normal heart size. No pericardial effusion.   . Lungs: Mild scattered linear atelectasis.  . Pleura: Within normal limits.     ABDOMEN:  . Liver: Within normal limits.  . Gallbladder/biliary: Gallbladder is mildly distended but no calcified gallstones, gallbladder wall thickening, or biliary dilatation.  Marland Kitchen Spleen: Within normal limits.  . Pancreas: Within normal limits.  . Adrenals: Within normal limits.  . Kidneys: Small hypodense lesions involving both kidneys are too small to accurately characterize, but statistically are most likely benign. No urolithiasis or hydronephrosis.  . Peritoneum: No free air or ascites. No threshold lymphadenopathy.  . Mesentery: Within normal limits.  . Extraperitoneum: Within normal limits.  . GI tract: Several diverticula involve the distal large bowel without definite associated inflammatory change particularly in the region of the sigmoid colon. Normal appendix. No obstruction.  . Vascular: Within normal limits.    PELVIS:  . Peritoneum: Small volume free fluid layers dependently.  Marland Kitchen Extraperitoneum: Within normal limits.  . Ureters: Slight dilatation of the ureters could simply be related to mild distention of the urinary bladder.  . Bladder: Mild distention.  . Reproductive system: Enhancing nodule along the anterior uterine fundal wall near the junction with the cervix may represent a fibroid. Low-attenuation cystic lesion involving the left adnexa measures up to 4.5 cm and demonstrates no solid or enhancing component. Much smaller cystic lesions noted within the right adnexa.  . Vascular: Within normal  limits.    MSK:  . Grade 2 anterolisthesis at L5-S1 measuring up to 11 mm with bilateral L5 pars defects. 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 Age-indeterminate inferior endplate compression deformity of L1 with up to 30-40% anterior vertebral body height loss and minor bony retropulsion. Other polyarticular degenerative changes. Bilateral breast implants partially imaged.  ____________________________________________ Other:   _____________________________________________ Assessment & Plan  Assessment: Encounter Diagnoses  Name Primary?  . Acute gastroenteritis Yes  . Abnormal finding on GI tract imaging   . Personal history of colonic polyps    Clinical picture most consistent with acute infectious gastritis with protracted vomiting, chills, cramps but no diarrhea.  She is totally recovered.  CT finding of distal esophageal thickening is related to the acute infectious illness with vomiting.  Significance of the adnexal findings on CT are unclear.[name] Bell discussed this with Dr. Jenny Reichmann, has not been referred to gynecology.  She has not seen a gynecologist in many years since living in [place].  Radiologist description of these findings sounds most likely benign, but I think further evaluation by gynecology is warranted.   Plan: We referred her to [facility], complete gynecologic exam and consideration of ultrasound.  CT images cannot be personally reviewed because they were outside our health system.  She will follow up  with me as needed.  We also discussed current colon polyp surveillance guidelines and that she does not need future surveillance colonoscopy at her age.  30 minutes were spent on this encounter (including chart review, history/exam, counseling/coordination of care, and documentation)  [name] Bell

## 2020-04-12 NOTE — Patient Instructions (Signed)
You are scheduled with Dr. Radene Journey at [facility] on 04/30/2020 at 11:10am.  Please arrive 15 minutes early.  Come by yourself and wear a mask.  Please bring a copy of your driver's license, insurance card and a list of your medications.    [address], [place], [place] [address] Phone: [phone]

## 2020-04-13 DIAGNOSIS — R1900 Intra-abdominal and pelvic swelling, mass and lump, unspecified site: Secondary | ICD-10-CM | POA: Insufficient documentation

## 2020-04-30 DIAGNOSIS — R1904 Left lower quadrant abdominal swelling, mass and lump: Secondary | ICD-10-CM | POA: Diagnosis not present

## 2020-04-30 DIAGNOSIS — I10 Essential (primary) hypertension: Secondary | ICD-10-CM | POA: Insufficient documentation

## 2020-05-18 ENCOUNTER — Other Ambulatory Visit: Payer: Self-pay | Admitting: Neurology

## 2020-05-18 NOTE — Telephone Encounter (Signed)
Is this ok to fill? 

## 2020-05-21 NOTE — Telephone Encounter (Signed)
Rx(s) sent to pharmacy electronically.  

## 2020-05-25 ENCOUNTER — Ambulatory Visit (INDEPENDENT_AMBULATORY_CARE_PROVIDER_SITE_OTHER): Payer: Medicare Other | Admitting: Neurology

## 2020-05-25 ENCOUNTER — Other Ambulatory Visit: Payer: Self-pay

## 2020-05-25 DIAGNOSIS — G243 Spasmodic torticollis: Secondary | ICD-10-CM

## 2020-05-25 MED ORDER — ONABOTULINUMTOXINA 100 UNITS IJ SOLR
340.0000 [IU] | Freq: Once | INTRAMUSCULAR | Status: AC
  Administered 2020-05-25: 340 [IU] via INTRAMUSCULAR

## 2020-05-25 NOTE — Procedures (Signed)
[facility]   Procedure Note Botox  Attending: Dr. Wells Guiles Meleana Commerford  Preoperative Diagnosis(es): Cervical Dystonia  Result History  Onset of effect: immediately More worn off b/c late for injections b/c of covid vaccine (waited few weeks after had vaccine)   Consent obtained from: The patient  Benefits discussed included, but were not limited to decreased muscle tightness, increased joint range of motion, and decreased pain.  Risk discussed included, but were not limited pain and discomfort, bleeding, bruising, excessive weakness, venous thrombosis, muscle atrophy and dysphagia.  A copy of the patient medication guide was given to the patient which explains the blackbox warning.  Informed consent was obtained  Patients identity and treatment sites confirmed yes.     Details of Procedure: Skin was cleaned with alcohol.  A 30 gauge, 1/2 inch needle was introduced to the target muscle (except splenius capitus, posterior approach, where 27 inch, 1 1/2 gauge needle was used).  Prior to injection, the needle plunger was aspirated to make sure the needle was not within a blood vessel.  There was no blood retrieved on aspiration.    Following is a summary of the muscles injected  And the amount of Botulinum toxin used:   Dilution 0.9% preservative free saline mixed with 100 u Botox type A to make 10 U per 0.1cc  Injections  Location Left  Right Units Number of sites        Sternocleidomastoid 60+40 0 100 1  Splenius Capitus, posterior approach 0 80 80 1  Splenius Capitus, lateral approach 0 60 60 1  Levator Scapulae 30  30 0  Trapezius 10/18/19/20  70         TOTAL UNITS:   340    Agent: Botulinum Type A ( Onobotulinum Toxin type A ). 4 vials of Botox were used, containing 100 units and freshly diluted with 1 mL of sterile, non-preserved saline    Total injected (Units): 340  Total wasted (Units): 30   Pt tolerated procedure well without complications.   Reinjection is  anticipated in 3 months. Clinical comment: pt asks for RF of trazodone.  Uses for sleep given neck pain at night.  RF

## 2020-05-29 ENCOUNTER — Other Ambulatory Visit: Payer: Self-pay | Admitting: Internal Medicine

## 2020-06-20 DIAGNOSIS — R1903 Right lower quadrant abdominal swelling, mass and lump: Secondary | ICD-10-CM | POA: Diagnosis not present

## 2020-06-20 DIAGNOSIS — N83202 Unspecified ovarian cyst, left side: Secondary | ICD-10-CM | POA: Diagnosis not present

## 2020-06-25 ENCOUNTER — Telehealth: Payer: Self-pay

## 2020-06-25 NOTE — Telephone Encounter (Signed)
Appointment has been made for 06/28/20.

## 2020-06-25 NOTE — Telephone Encounter (Signed)
Can pt be scheduled for surgical clearance. She has not had an ekg since 2017. Some labs may also need to be done.

## 2020-06-25 NOTE — Telephone Encounter (Signed)
New message  1. What type of surgery is being performed? Laparoscopy removal of orvaries   2. When is this surgery scheduled? 7.19.21   3. What type of clearance is required (medical clearance vs. Pharmacy clearance to hold med vs. Both)? Both   4. Are there any medications that need to be held prior to surgery and how long? Please advise by Dr. Jenny Reichmann   5. Practice name and name of physician performing surgery?  Dr. Everlene Farrier [facility]   6. What is the office phone number? [phone]   7.   What is the office fax number? fax # [phone]   8.   Anesthesia type (None, local, MAC, general) ? General  / outpatient

## 2020-06-26 ENCOUNTER — Ambulatory Visit: Payer: Medicare Other

## 2020-06-27 ENCOUNTER — Ambulatory Visit: Payer: Medicare Other | Admitting: Internal Medicine

## 2020-06-27 DIAGNOSIS — H353122 Nonexudative age-related macular degeneration, left eye, intermediate dry stage: Secondary | ICD-10-CM | POA: Diagnosis not present

## 2020-06-28 ENCOUNTER — Encounter: Payer: Self-pay | Admitting: Internal Medicine

## 2020-06-28 ENCOUNTER — Ambulatory Visit (INDEPENDENT_AMBULATORY_CARE_PROVIDER_SITE_OTHER): Payer: Medicare Other | Admitting: Internal Medicine

## 2020-06-28 ENCOUNTER — Other Ambulatory Visit: Payer: Self-pay

## 2020-06-28 VITALS — BP 140/90 | HR 67 | Temp 98.5°F | Ht 64.0 in | Wt 166.0 lb

## 2020-06-28 DIAGNOSIS — M79671 Pain in right foot: Secondary | ICD-10-CM | POA: Diagnosis not present

## 2020-06-28 DIAGNOSIS — F411 Generalized anxiety disorder: Secondary | ICD-10-CM | POA: Diagnosis not present

## 2020-06-28 DIAGNOSIS — Z01818 Encounter for other preprocedural examination: Secondary | ICD-10-CM | POA: Diagnosis not present

## 2020-06-28 DIAGNOSIS — R7302 Impaired glucose tolerance (oral): Secondary | ICD-10-CM | POA: Diagnosis not present

## 2020-06-28 DIAGNOSIS — Z0000 Encounter for general adult medical examination without abnormal findings: Secondary | ICD-10-CM

## 2020-06-28 DIAGNOSIS — I2510 Atherosclerotic heart disease of native coronary artery without angina pectoris: Secondary | ICD-10-CM | POA: Diagnosis not present

## 2020-06-28 DIAGNOSIS — I2583 Coronary atherosclerosis due to lipid rich plaque: Secondary | ICD-10-CM | POA: Diagnosis not present

## 2020-06-28 DIAGNOSIS — E785 Hyperlipidemia, unspecified: Secondary | ICD-10-CM

## 2020-06-28 NOTE — Progress Notes (Signed)
Subjective:    Patient ID: [name] Bell, female    DOB: 1941-02-24, 79 y.o.   MRN: [id number]  HPI  Here to f/u; overall doing ok,  Pt denies chest pain, increasing sob or doe, wheezing, orthopnea, PND, increased LE swelling, palpitations, dizziness or syncope.  Pt denies new neurological symptoms such as new headache, or facial or extremity weakness or numbness.  Pt denies polydipsia, polyuria, or low sugar episode.  Pt states overall good compliance with meds, mostly trying to follow appropriate diet, with wt overall stable,  but little exercise however.  Needs note to GYN for preop surgury.  Has ongoing right foot pain followed per surgury, but with persistent spasms and asks for muscle relaxer.    No new complaints Past Medical History:  Diagnosis Date  . 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 ALLERGIC RHINITIS 01/24/2008  . Allergy   . ANEMIA-IRON DEFICIENCY 01/24/2008   pt denied  . ANXIETY 02/26/2009  . BACK PAIN 09/30/2010  . Breast cancer ([place])   . 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 DIZZINESS 02/28/2010  . ELEVATED BLOOD PRESSURE WITHOUT DIAGNOSIS OF HYPERTENSION 02/26/2009   patient denies  . FATIGUE 02/26/2009  . GERD 01/24/2008  . History of blood transfusion ectopic pregnancy  . Neuromuscular disorder ([place])   . SHINGLES 11/06/2010  . TOE PAIN 09/30/2010  . VARICOSE VEINS, LOWER EXTREMITIES 08/16/2008  . 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 AUGMENTATION MAMMAPLASTY Bilateral 10/09/2016  . BACK SURGERY  2010   fusion  . BREAST LUMPECTOMY Right 05/14/2015   Procedure: RIGHT BREAST LUMPECTOMY;  Surgeon: Alphonsa Overall, MD;  Location: [facility];  Service: General;  Laterality: Right;  . BREAST RECONSTRUCTION WITH PLACEMENT OF TISSUE EXPANDER AND FLEX HD (ACELLULAR HYDRATED DERMIS) Right 10/09/2016   Procedure: IMMEDIATE RIGHT BREAST RECONSTRUCTION  WITH PLACEMENT OF SILICONE GEL IMPLANTS OR TISSUE EXPANDER AND ACELLULAR DERMAL MATRIX;  Surgeon: Crissie Reese, MD;  Location: [facility];  Service: Plastics;  Laterality: Right;  . ECTOPIC PREGNANCY SURGERY     at 79 yo with salpingectomy  . FOOT SURGERY Right    bunion, hammer toe.  x 2  . FOOT SURGERY Right 05/2016   3 surgeries on right foot.  . INCISION AND DRAINAGE OF WOUND Right 10/09/2016   Procedure: IRRIGATION AND DEBRIDEMENT BREAST HEMATOMA;  Surgeon: Crissie Reese, MD;  Location: [facility];  Service: Plastics;  Laterality: Right;  . MASTECTOMY Right 10/09/2016  . MASTECTOMY W/ SENTINEL NODE BIOPSY Right 10/09/2016  . MASTECTOMY W/ SENTINEL NODE BIOPSY Right 10/09/2016   Procedure: RIGHT MASTECTOMY WITH SENTINEL LYMPH NODE BIOPSY;  Surgeon: Alphonsa Overall, MD;  Location: [facility];  Service: General;  Laterality: Right;  . s/p bilateral breasts implants  12/2007  . s/p lumbar surgury  july 2010  . TONSILLECTOMY      reports that she has never smoked. She has never used smokeless tobacco. She reports current alcohol use of about 6.0 standard drinks of alcohol per week. She reports that she does not use drugs. family history includes Asthma in her brother; Lung cancer in her father. Allergies  Allergen Reactions  . Advil [Ibuprofen]     Per pt, makes her feel "confused when she takes Advil with meds  . Iron     Oxidized iron-makes hand and body break out when touched.  . No Known Allergies  Current Outpatient Medications on File Prior to Visit  Medication Sig Dispense Refill  . acetaminophen (TYLENOL) 325 MG tablet Take 650 mg by mouth every 6 (six) hours as needed.    . clonazePAM (KLONOPIN) 1 MG tablet TAKE ONE (1) TABLET BY MOUTH TWO (2) TIMES DAILY 180 tablet 1  . Cyanocobalamin (VITAMIN B-12) 1000 MCG SUBL Place under the tongue. Vit b-12 1000 mg with Folate 400 mg-Take one daily    . docusate sodium (COLACE) 100 MG capsule Take 100 mg by mouth 2 (two) times daily.    . fluticasone  (FLONASE) 50 MCG/ACT nasal spray USE 2 SPRAYS INTO BOTH NOSTRILS DAILY 16 g 2  . glucosamine-chondroitin 500-400 MG tablet Take 1 tablet by mouth daily.    . hydrochlorothiazide (HYDRODIURIL) 25 MG tablet TAKE 1 TABLET BY MOUTH DAILY (Patient taking differently: as needed. ) 90 tablet 0  . OVER THE COUNTER MEDICATION Drinks beet juice daily.    Marland Kitchen OVER THE COUNTER MEDICATION Drinks vegetable juice daily.    Marland Kitchen RETIN-A 0.05 % cream APPLY TO AFFECTED AREA(S) AT BEDTIME. 45 g 1  . tiZANidine (ZANAFLEX) 2 MG tablet Take 1 tablet (2 mg total) by mouth every 6 (six) hours as needed for muscle spasms. 30 tablet 5  . traZODone (DESYREL) 50 MG tablet TAKE ONE TABLET BY MOUTH AT BEDTIME 90 tablet 1  . tretinoin (RETIN-A) 0.025 % cream Apply topically at bedtime. 45 g 0  . TURMERIC PO Take by mouth. Turmeric and Ginger -Take one pill tid    . Vitamin D, Ergocalciferol, (DRISDOL) 1.25 MG (50000 UNIT) CAPS capsule Take 1 capsule (50,000 Units total) by mouth every 7 (seven) days. 12 capsule 0   No current facility-administered medications on file prior to visit.   Review of Systems All otherwise neg per pt     Objective:   Physical Exam BP 140/90 (BP Location: Left Arm, Patient Position: Sitting, Cuff Size: Large)   Pulse 67   Temp 98.5 F (36.9 C) (Oral)   Ht 5\' 4"  (1.626 m)   Wt 166 lb (75.3 kg)   SpO2 97%   BMI 28.49 kg/m  VS noted,  Constitutional: Pt appears in NAD HENT: Head: NCAT.  Right Ear: External ear normal.  Left Ear: External ear normal.  Eyes: . Pupils are equal, round, and reactive to light. Conjunctivae and EOM are normal Nose: without d/c or deformity Neck: Neck supple. Gross normal ROM Cardiovascular: Normal rate and regular rhythm.   Pulmonary/Chest: Effort normal and breath sounds without rales or wheezing.  Abd:  Soft, NT, ND, + BS, no organomegaly Neurological: Pt is alert. At baseline orientation, motor grossly intact Skin: Skin is warm. No rashes, other new lesions,  no LE edema Psychiatric: Pt behavior is normal without agitation  All otherwise neg per pt Lab Results  Component Value Date   WBC 7.2 01/30/2020   HGB 13.4 01/30/2020   HCT 39.6 01/30/2020   PLT 319.0 01/30/2020   GLUCOSE 86 01/30/2020   CHOL 180 01/30/2020   TRIG 61.0 01/30/2020   HDL 68.30 01/30/2020   LDLDIRECT 128.5 07/04/2013   LDLCALC 100 (H) 01/30/2020   ALT 18 01/30/2020   AST 21 01/30/2020   NA 136 01/30/2020   K 4.0 01/30/2020   CL 101 01/30/2020   CREATININE 0.77 01/30/2020   BUN 14 01/30/2020   CO2 29 01/30/2020   TSH 1.58 01/30/2020   INR 0.9 07/17/2009   HGBA1C 5.3 01/30/2020  Assessment & Plan:

## 2020-06-28 NOTE — Patient Instructions (Addendum)
We will send the OK for surgury to your GYN - Dr Gaetano Net  Please continue all other medications as before, and refills have been done if requested.  Please have the pharmacy call with any other refills you may need.  Please continue your efforts at being more active, low cholesterol diet, and weight control.  Please keep your appointments with your specialists as you may have planned

## 2020-07-01 ENCOUNTER — Encounter: Payer: Self-pay | Admitting: Internal Medicine

## 2020-07-01 NOTE — Assessment & Plan Note (Signed)
stable overall by history and exam, recent data reviewed with pt, and pt to continue medical treatment as before,  to f/u any worsening symptoms or concerns  

## 2020-07-01 NOTE — Assessment & Plan Note (Signed)
[place] for [employer] as planned

## 2020-07-01 NOTE — Assessment & Plan Note (Addendum)
Ok for muscle relaxer prn  I spent 31 minutes in preparing to see the patient by review of recent labs, imaging and procedures, obtaining and reviewing separately obtained history, communicating with the patient and family or caregiver, ordering medications, tests or procedures, and documenting clinical information in the EHR including the differential Dx, treatment, and any further evaluation and other management of right foot pain, anxiety, hld, hyperglycemia, preop internal med

## 2020-07-03 DIAGNOSIS — M79671 Pain in right foot: Secondary | ICD-10-CM | POA: Diagnosis not present

## 2020-07-11 DIAGNOSIS — N83202 Unspecified ovarian cyst, left side: Secondary | ICD-10-CM | POA: Diagnosis not present

## 2020-07-16 ENCOUNTER — Other Ambulatory Visit: Payer: Self-pay | Admitting: Obstetrics and Gynecology

## 2020-07-16 DIAGNOSIS — N736 Female pelvic peritoneal adhesions (postinfective): Secondary | ICD-10-CM | POA: Diagnosis not present

## 2020-07-16 DIAGNOSIS — N83202 Unspecified ovarian cyst, left side: Secondary | ICD-10-CM | POA: Diagnosis not present

## 2020-07-16 DIAGNOSIS — N83201 Unspecified ovarian cyst, right side: Secondary | ICD-10-CM | POA: Diagnosis not present

## 2020-07-26 ENCOUNTER — Other Ambulatory Visit: Payer: Self-pay | Admitting: Internal Medicine

## 2020-07-26 NOTE — Telephone Encounter (Signed)
Please refill as per office routine med refill policy (all routine meds refilled for 3 mo or monthly per pt preference up to one year from last visit, then month to month grace period for 3 mo, then further med refills will have to be denied)  

## 2020-07-30 ENCOUNTER — Ambulatory Visit: Payer: Medicare Other | Admitting: Internal Medicine

## 2020-08-08 ENCOUNTER — Encounter: Payer: Self-pay | Admitting: Internal Medicine

## 2020-08-08 ENCOUNTER — Other Ambulatory Visit: Payer: Self-pay

## 2020-08-08 ENCOUNTER — Ambulatory Visit (INDEPENDENT_AMBULATORY_CARE_PROVIDER_SITE_OTHER): Payer: Medicare Other | Admitting: Internal Medicine

## 2020-08-08 VITALS — BP 122/68 | HR 60 | Temp 97.7°F | Ht 64.0 in | Wt 164.0 lb

## 2020-08-08 DIAGNOSIS — E559 Vitamin D deficiency, unspecified: Secondary | ICD-10-CM | POA: Diagnosis not present

## 2020-08-08 DIAGNOSIS — I2583 Coronary atherosclerosis due to lipid rich plaque: Secondary | ICD-10-CM

## 2020-08-08 DIAGNOSIS — Z1159 Encounter for screening for other viral diseases: Secondary | ICD-10-CM | POA: Diagnosis not present

## 2020-08-08 DIAGNOSIS — R7302 Impaired glucose tolerance (oral): Secondary | ICD-10-CM

## 2020-08-08 DIAGNOSIS — F411 Generalized anxiety disorder: Secondary | ICD-10-CM | POA: Diagnosis not present

## 2020-08-08 DIAGNOSIS — E785 Hyperlipidemia, unspecified: Secondary | ICD-10-CM | POA: Diagnosis not present

## 2020-08-08 DIAGNOSIS — I2510 Atherosclerotic heart disease of native coronary artery without angina pectoris: Secondary | ICD-10-CM | POA: Diagnosis not present

## 2020-08-08 NOTE — Assessment & Plan Note (Signed)
stable overall by history and exam, recent data reviewed with pt, and pt to continue medical treatment as before,  to f/u any worsening symptoms or concerns  

## 2020-08-08 NOTE — Assessment & Plan Note (Addendum)
Cont oral replacement  I spent 31 minutes in preparing to see the patient by review of recent labs, imaging and procedures, obtaining and reviewing separately obtained history, communicating with the patient and family or caregiver, ordering medications, tests or procedures, and documenting clinical information in the EHR including the differential Dx, treatment, and any further evaluation and other management of vit d deficiency, hyperglycemia, hld, anxiety

## 2020-08-08 NOTE — Progress Notes (Signed)
Subjective:    Patient ID: [name] Bell, female    DOB: 1941/07/04, 79 y.o.   MRN: [id number]  HPI  Here to f/u; overall doing ok,  Pt denies chest pain, increasing sob or doe, wheezing, orthopnea, PND, increased LE swelling, palpitations, dizziness or syncope.  Pt denies new neurological symptoms such as new headache, or facial or extremity weakness or numbness.  Pt denies polydipsia, polyuria, or low sugar episode.  Pt states overall good compliance with meds, mostly trying to follow appropriate diet, with wt overall stable,  but little exercise however. Getting stitches out tomorrow post surgury., tolerated well.  toleratein vit d well.  No new complaints Past Medical History:  Diagnosis Date  . 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 ALLERGIC RHINITIS 01/24/2008  . Allergy   . ANEMIA-IRON DEFICIENCY 01/24/2008   pt denied  . ANXIETY 02/26/2009  . BACK PAIN 09/30/2010  . Breast cancer ([place])   . Cancer of central portion of female breast ([place]) 05/04/2015   right breast/ no chemo or radiation done  . DIZZINESS 02/28/2010  . ELEVATED BLOOD PRESSURE WITHOUT DIAGNOSIS OF HYPERTENSION 02/26/2009   patient denies  . FATIGUE 02/26/2009  . GERD 01/24/2008  . History of blood transfusion ectopic pregnancy  . Neuromuscular disorder ([place])   . SHINGLES 11/06/2010  . TOE PAIN 09/30/2010  . VARICOSE VEINS, LOWER EXTREMITIES 08/16/2008  . 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 AUGMENTATION MAMMAPLASTY Bilateral 10/09/2016  . BACK SURGERY  2010   fusion  . BREAST LUMPECTOMY Right 05/14/2015   Procedure: RIGHT BREAST LUMPECTOMY;  Surgeon: Alphonsa Overall, MD;  Location: [facility];  Service: General;  Laterality: Right;  . BREAST RECONSTRUCTION WITH PLACEMENT OF TISSUE EXPANDER AND FLEX HD (ACELLULAR HYDRATED DERMIS) Right 10/09/2016   Procedure: IMMEDIATE RIGHT BREAST RECONSTRUCTION WITH PLACEMENT OF SILICONE GEL IMPLANTS OR TISSUE EXPANDER AND  ACELLULAR DERMAL MATRIX;  Surgeon: Crissie Reese, MD;  Location: [facility];  Service: Plastics;  Laterality: Right;  . ECTOPIC PREGNANCY SURGERY     at 79 yo with salpingectomy  . FOOT SURGERY Right    bunion, hammer toe.  x 2  . FOOT SURGERY Right 05/2016   3 surgeries on right foot.  . INCISION AND DRAINAGE OF WOUND Right 10/09/2016   Procedure: IRRIGATION AND DEBRIDEMENT BREAST HEMATOMA;  Surgeon: Crissie Reese, MD;  Location: [facility];  Service: Plastics;  Laterality: Right;  . MASTECTOMY Right 10/09/2016  . MASTECTOMY W/ SENTINEL NODE BIOPSY Right 10/09/2016  . MASTECTOMY W/ SENTINEL NODE BIOPSY Right 10/09/2016   Procedure: RIGHT MASTECTOMY WITH SENTINEL LYMPH NODE BIOPSY;  Surgeon: Alphonsa Overall, MD;  Location: [facility];  Service: General;  Laterality: Right;  . s/p bilateral breasts implants  12/2007  . s/p lumbar surgury  july 2010  . TONSILLECTOMY      reports that she has never smoked. She has never used smokeless tobacco. She reports current alcohol use of about 6.0 standard drinks of alcohol per week. She reports that she does not use drugs. family history includes Asthma in her brother; Lung cancer in her father. Allergies  Allergen Reactions  . Advil [Ibuprofen]     Per pt, makes her feel "confused when she takes Advil with meds  . Iron     Oxidized iron-makes hand and body break out when touched.  . No Known Allergies    Current Outpatient Medications on File Prior to Visit  Medication Sig Dispense Refill  .  acetaminophen (TYLENOL) 325 MG tablet Take 650 mg by mouth every 6 (six) hours as needed.    . clonazePAM (KLONOPIN) 1 MG tablet TAKE ONE (1) TABLET BY MOUTH TWO (2) TIMES DAILY 180 tablet 1  . Cyanocobalamin (VITAMIN B-12) 1000 MCG SUBL Place under the tongue. Vit b-12 1000 mg with Folate 400 mg-Take one daily    . docusate sodium (COLACE) 100 MG capsule Take 100 mg by mouth 2 (two) times daily.    . fluticasone (FLONASE) 50 MCG/ACT nasal spray USE 2 SPRAYS INTO BOTH NOSTRILS  DAILY 16 g 2  . glucosamine-chondroitin 500-400 MG tablet Take 1 tablet by mouth daily.    . hydrochlorothiazide (HYDRODIURIL) 25 MG tablet TAKE ONE (1) TABLET BY MOUTH EVERY DAY 90 tablet 0  . levofloxacin (LEVAQUIN) 750 MG tablet levofloxacin 750 mg tablet    . methocarbamol (ROBAXIN) 500 MG tablet SMARTSIG:1 Tablet(s) By Mouth 4-5 Times Daily    . OVER THE COUNTER MEDICATION Drinks beet juice daily.    Marland Kitchen OVER THE COUNTER MEDICATION Drinks vegetable juice daily.    Marland Kitchen RETIN-A 0.05 % cream APPLY TO AFFECTED AREA(S) AT BEDTIME. 45 g 1  . tiZANidine (ZANAFLEX) 2 MG tablet Take 1 tablet (2 mg total) by mouth every 6 (six) hours as needed for muscle spasms. 30 tablet 5  . traMADol (ULTRAM) 50 MG tablet tramadol 50 mg tablet    . traZODone (DESYREL) 50 MG tablet TAKE ONE TABLET BY MOUTH AT BEDTIME 90 tablet 1  . tretinoin (RETIN-A) 0.025 % cream Apply topically at bedtime. 45 g 0  . TURMERIC PO Take by mouth. Turmeric and Ginger -Take one pill tid     No current facility-administered medications on file prior to visit.    Review of Systems All otherwise neg per pt     Objective:   Physical Exam BP 122/68 (BP Location: Left Arm, Patient Position: Sitting, Cuff Size: Large)   Pulse 60   Temp 97.7 F (36.5 C) (Oral)   Ht 5\' 4"  (1.626 m)   Wt 164 lb (74.4 kg)   SpO2 95%   BMI 28.15 kg/m  VS noted,  Constitutional: Pt appears in NAD HENT: Head: NCAT.  Right Ear: External ear normal.  Left Ear: External ear normal.  Eyes: . Pupils are equal, round, and reactive to light. Conjunctivae and EOM are normal Nose: without d/c or deformity Neck: Neck supple. Gross normal ROM Cardiovascular: Normal rate and regular rhythm.   Pulmonary/Chest: Effort normal and breath sounds without rales or wheezing.  Abd:  Soft, NT, ND, + BS, no organomegaly Neurological: Pt is alert. At baseline orientation, motor grossly intact Skin: Skin is warm. No rashes, other new lesions, no LE edema Psychiatric: Pt  behavior is normal without agitation  All otherwise neg per pt Lab Results  Component Value Date   WBC 7.2 01/30/2020   HGB 13.4 01/30/2020   HCT 39.6 01/30/2020   PLT 319.0 01/30/2020   GLUCOSE 86 01/30/2020   CHOL 180 01/30/2020   TRIG 61.0 01/30/2020   HDL 68.30 01/30/2020   LDLDIRECT 128.5 07/04/2013   LDLCALC 100 (H) 01/30/2020   ALT 18 01/30/2020   AST 21 01/30/2020   NA 136 01/30/2020   K 4.0 01/30/2020   CL 101 01/30/2020   CREATININE 0.77 01/30/2020   BUN 14 01/30/2020   CO2 29 01/30/2020   TSH 1.58 01/30/2020   INR 0.9 07/17/2009   HGBA1C 5.3 01/30/2020      Assessment & Plan:

## 2020-08-08 NOTE — Patient Instructions (Signed)
Please continue all other medications as before, and refills have been done if requested. ° °Please have the pharmacy call with any other refills you may need. ° °Please continue your efforts at being more active, low cholesterol diet, and weight control. ° °You are otherwise up to date with prevention measures today. ° °Please keep your appointments with your specialists as you may have planned ° °Please make an Appointment to return in 6 months, or sooner if needed °

## 2020-08-24 ENCOUNTER — Ambulatory Visit (INDEPENDENT_AMBULATORY_CARE_PROVIDER_SITE_OTHER): Payer: Medicare Other | Admitting: Neurology

## 2020-08-24 ENCOUNTER — Other Ambulatory Visit: Payer: Self-pay

## 2020-08-24 DIAGNOSIS — G243 Spasmodic torticollis: Secondary | ICD-10-CM | POA: Diagnosis not present

## 2020-08-24 MED ORDER — TRAZODONE HCL 50 MG PO TABS: 50.0000 mg | ORAL_TABLET | Freq: Every day | ORAL | 1 refills | Status: DC

## 2020-08-24 NOTE — Procedures (Signed)
[facility]   Procedure Note Botox  Attending: Dr. Wells Guiles Serafina Topham  Preoperative Diagnosis(es): Cervical Dystonia  Result History  Onset of effect: immediately More worn off b/c late for injections b/c of covid vaccine (waited few weeks after had vaccine)   Consent obtained from: The patient  Benefits discussed included, but were not limited to decreased muscle tightness, increased joint range of motion, and decreased pain.  Risk discussed included, but were not limited pain and discomfort, bleeding, bruising, excessive weakness, venous thrombosis, muscle atrophy and dysphagia.  A copy of the patient medication guide was given to the patient which explains the blackbox warning.  Informed consent was obtained  Patients identity and treatment sites confirmed yes.     Details of Procedure: Skin was cleaned with alcohol.  A 30 gauge, 1/2 inch needle was introduced to the target muscle (except splenius capitus, posterior approach, where 27 inch, 1 1/2 gauge needle was used).  Prior to injection, the needle plunger was aspirated to make sure the needle was not within a blood vessel.  There was no blood retrieved on aspiration.    Following is a summary of the muscles injected  And the amount of Botulinum toxin used:   Dilution 0.9% preservative free saline mixed with 100 u Botox type A to make 10 U per 0.1cc  Injections  Location Left  Right Units Number of sites        Sternocleidomastoid 60+40 0 100 1  Splenius Capitus, posterior approach 0 100 100 1  Splenius Capitus, lateral approach 0 60 60 1  Levator Scapulae 30  30 0  Trapezius 10/18/19/20  70         TOTAL UNITS:   360    Agent: Botulinum Type A ( Onobotulinum Toxin type A ). 4 vials of Botox were used, containing 100 units and freshly diluted with 1 mL of sterile, non-preserved saline    Total injected (Units): 360  Total wasted (Units): 40   Pt tolerated procedure well without complications.   Reinjection is  anticipated in 3 months. Clinical comment: pt asks for RF of trazodone.  Uses for sleep given neck pain at night.  RF again today

## 2020-09-11 ENCOUNTER — Other Ambulatory Visit: Payer: Self-pay | Admitting: Internal Medicine

## 2020-09-11 DIAGNOSIS — Z0000 Encounter for general adult medical examination without abnormal findings: Secondary | ICD-10-CM

## 2020-09-18 ENCOUNTER — Telehealth: Payer: Self-pay | Admitting: Neurology

## 2020-09-18 NOTE — Telephone Encounter (Signed)
I spoke with patient and gave the advice of Dr.Tat's recommendations, she thanked me for calling.

## 2020-09-18 NOTE — Telephone Encounter (Signed)
This time it has been hard swallowing, choking on water on occasion. Please advise, doesn't want to stop Botox, but would like advise.

## 2020-09-18 NOTE — Telephone Encounter (Signed)
We have discussed with her before.  Unfortunately, its one of the complications of treatment that she has had in varying degrees.  For now, she may need to thicken her liquids (Thick-it) and eat soft foods (ground beef instead of steak) until the botox wears off some.  I will decrease dose to that muscles next time

## 2020-09-18 NOTE — Telephone Encounter (Signed)
Patient called in stating since her botox, she has had a lot of trouble swallowing and turning her head to the left. She has lost about 5 pounds so far because of the swallowing difficulty. She would like some advice.

## 2020-09-25 ENCOUNTER — Ambulatory Visit: Payer: Medicare Other

## 2020-09-26 ENCOUNTER — Other Ambulatory Visit: Payer: Self-pay | Admitting: Internal Medicine

## 2020-09-26 ENCOUNTER — Ambulatory Visit
Admission: RE | Admit: 2020-09-26 | Discharge: 2020-09-26 | Disposition: A | Discharge status: Home / Self Care | Payer: Medicare Other | Source: Ambulatory Visit | Attending: Internal Medicine | Admitting: Internal Medicine

## 2020-09-26 ENCOUNTER — Other Ambulatory Visit: Payer: Self-pay

## 2020-09-26 DIAGNOSIS — Z1231 Encounter for screening mammogram for malignant neoplasm of breast: Secondary | ICD-10-CM

## 2020-09-26 DIAGNOSIS — Z0000 Encounter for general adult medical examination without abnormal findings: Secondary | ICD-10-CM

## 2020-10-03 ENCOUNTER — Other Ambulatory Visit: Payer: Self-pay | Admitting: Neurology

## 2020-10-03 DIAGNOSIS — Z23 Encounter for immunization: Secondary | ICD-10-CM | POA: Diagnosis not present

## 2020-10-03 NOTE — Telephone Encounter (Signed)
Patient called in wanting to get a refill on her clonazepam. She is going out of town and will run out on Sunday. She also would like for Dr. Carles Collet to know she is just now being able to swallow and eat food with out water. She is also having pain in her shoulder and the back of her neck where the "big" shot goes in still hurts to the touch.

## 2020-10-25 DIAGNOSIS — Z23 Encounter for immunization: Secondary | ICD-10-CM | POA: Diagnosis not present

## 2020-10-31 DIAGNOSIS — L570 Actinic keratosis: Secondary | ICD-10-CM | POA: Diagnosis not present

## 2020-10-31 DIAGNOSIS — L814 Other melanin hyperpigmentation: Secondary | ICD-10-CM | POA: Diagnosis not present

## 2020-10-31 DIAGNOSIS — L821 Other seborrheic keratosis: Secondary | ICD-10-CM | POA: Diagnosis not present

## 2020-11-20 DIAGNOSIS — M19071 Primary osteoarthritis, right ankle and foot: Secondary | ICD-10-CM | POA: Diagnosis not present

## 2020-11-26 ENCOUNTER — Telehealth: Payer: Self-pay | Admitting: Neurology

## 2020-11-26 NOTE — Telephone Encounter (Signed)
Patient called and said, "I'm having trouble swallowing. It's almost making me choke a little. I'm coming for Botox on Friday, 11/30/20. This is the worst time I've ever had with Botox. Is there something we can ease off on? I'm having pain in the left side of the neck. It has been this way since my last Botox injection."

## 2020-11-26 NOTE — Telephone Encounter (Signed)
Left detailed message with Dr Tat's recommendations, ok per DPR, and to call back if any questions.  

## 2020-11-26 NOTE — Telephone Encounter (Signed)
Patient called back after missing Tee's call. She would like a call back.

## 2020-11-26 NOTE — Telephone Encounter (Signed)
Yes!  She is the one that has been very reluctant to change pattern of botox as when we decreased/stopped doing that muscle in the past she got worse.

## 2020-11-26 NOTE — Telephone Encounter (Signed)
Spoke with Mrs Shor she stated that after she had her last botox she had a hard time swallowing and in the past 3 weeks it just got better, Pt is asking if maybe she needs to decrease the amount of botox she gets in the back of neck? She said she isn't sure Dr Tat is the Dr she is just asking? Mr Rauber stated that she had 3 episodes when eating that she got choked and that she was being very careful on what she eats and that she always has a bottle of water with her.

## 2020-11-27 NOTE — Telephone Encounter (Signed)
Okay.  Her spouse has always come back with her.

## 2020-11-27 NOTE — Telephone Encounter (Signed)
Patient states she understands. She states this time it was hard for her to swallow.   Patient states maybe you can put less in in her neck and maybe more in the back of her neck. Patient states she will point it out to Dr Tat during her next Botox appointment.   She stated it worries her husband. She states the Botox has lasted longer that it ever has.   Patient states she wants to her spouse to come on the room with her and I advised her that she is allowed to bring one visitor with her to her appointment.   The swallowing worries her spouse and her sons.

## 2020-11-27 NOTE — Telephone Encounter (Signed)
Patient made aware.

## 2020-11-30 ENCOUNTER — Ambulatory Visit (INDEPENDENT_AMBULATORY_CARE_PROVIDER_SITE_OTHER): Payer: Medicare Other | Admitting: Neurology

## 2020-11-30 ENCOUNTER — Other Ambulatory Visit: Payer: Self-pay

## 2020-11-30 DIAGNOSIS — G243 Spasmodic torticollis: Secondary | ICD-10-CM

## 2020-11-30 MED ORDER — ONABOTULINUMTOXINA 100 UNITS IJ SOLR
100.0000 [IU] | Freq: Once | INTRAMUSCULAR | Status: AC
  Administered 2020-11-30: 100 [IU] via INTRAMUSCULAR

## 2020-11-30 MED ORDER — ONABOTULINUMTOXINA 100 UNITS IJ SOLR: 30.0000 [IU] | Freq: Once | INTRAMUSCULAR | Status: DC

## 2020-11-30 NOTE — Procedures (Signed)
[facility]   Procedure Note Botox  Attending: Dr. Wells Guiles Zoejane Gaulin  Preoperative Diagnosis(es): Cervical Dystonia  Result History  Onset of effect: immediately Side effects:  Dysphagia so am going to d/c levator scapulae m injections   Consent obtained from: The patient  Benefits discussed included, but were not limited to decreased muscle tightness, increased joint range of motion, and decreased pain.  Risk discussed included, but were not limited pain and discomfort, bleeding, bruising, excessive weakness, venous thrombosis, muscle atrophy and dysphagia.  A copy of the patient medication guide was given to the patient which explains the blackbox warning.  Informed consent was obtained  Patients identity and treatment sites confirmed yes.     Details of Procedure: Skin was cleaned with alcohol.  A 30 gauge, 1/2 inch needle was introduced to the target muscle (except splenius capitus, posterior approach, where 27 inch, 1 1/2 gauge needle was used).  Prior to injection, the needle plunger was aspirated to make sure the needle was not within a blood vessel.  There was no blood retrieved on aspiration.    Following is a summary of the muscles injected  And the amount of Botulinum toxin used:   Dilution 0.9% preservative free saline mixed with 100 u Botox type A to make 10 U per 0.1cc  Injections  Location Left  Right Units Number of sites        Sternocleidomastoid 60+40 0 100 1  Splenius Capitus, posterior approach 0 100 100 1  Splenius Capitus, lateral approach 0 60 60 1  Levator Scapulae    0  Trapezius 10/18/19/20  70         TOTAL UNITS:   360    Agent: Botulinum Type A ( Onobotulinum Toxin type A ). 4 vials of Botox were used, containing 100 units and freshly diluted with 1 mL of sterile, non-preserved saline    Total injected (Units): 330  Total wasted (Units): 70   Pt tolerated procedure well without complications.   Reinjection is anticipated in 3 months.

## 2020-12-17 DIAGNOSIS — H353122 Nonexudative age-related macular degeneration, left eye, intermediate dry stage: Secondary | ICD-10-CM | POA: Diagnosis not present

## 2021-01-03 ENCOUNTER — Other Ambulatory Visit: Payer: Self-pay | Admitting: Neurology

## 2021-02-11 ENCOUNTER — Other Ambulatory Visit: Payer: Self-pay

## 2021-02-11 ENCOUNTER — Encounter: Payer: Self-pay | Admitting: Internal Medicine

## 2021-02-11 ENCOUNTER — Ambulatory Visit (INDEPENDENT_AMBULATORY_CARE_PROVIDER_SITE_OTHER): Payer: Medicare Other | Admitting: Internal Medicine

## 2021-02-11 VITALS — BP 124/70 | HR 60 | Temp 98.2°F | Ht 64.0 in | Wt 167.2 lb

## 2021-02-11 DIAGNOSIS — M19172 Post-traumatic osteoarthritis, left ankle and foot: Secondary | ICD-10-CM | POA: Insufficient documentation

## 2021-02-11 DIAGNOSIS — R269 Unspecified abnormalities of gait and mobility: Secondary | ICD-10-CM | POA: Insufficient documentation

## 2021-02-11 DIAGNOSIS — R7302 Impaired glucose tolerance (oral): Secondary | ICD-10-CM | POA: Diagnosis not present

## 2021-02-11 DIAGNOSIS — E7849 Other hyperlipidemia: Secondary | ICD-10-CM | POA: Diagnosis not present

## 2021-02-11 DIAGNOSIS — Z1159 Encounter for screening for other viral diseases: Secondary | ICD-10-CM | POA: Diagnosis not present

## 2021-02-11 DIAGNOSIS — E559 Vitamin D deficiency, unspecified: Secondary | ICD-10-CM | POA: Diagnosis not present

## 2021-02-11 DIAGNOSIS — R52 Pain, unspecified: Secondary | ICD-10-CM | POA: Insufficient documentation

## 2021-02-11 LAB — HEPATIC FUNCTION PANEL
ALT: 19 U/L (ref 0–35)
AST: 21 U/L (ref 0–37)
Albumin: 4.3 g/dL (ref 3.5–5.2)
Alkaline Phosphatase: 48 U/L (ref 39–117)
Bilirubin, Direct: 0.1 mg/dL (ref 0.0–0.3)
Total Bilirubin: 0.6 mg/dL (ref 0.2–1.2)
Total Protein: 7.6 g/dL (ref 6.0–8.3)

## 2021-02-11 LAB — URINALYSIS, ROUTINE W REFLEX MICROSCOPIC
Bilirubin Urine: NEGATIVE
Hgb urine dipstick: NEGATIVE
Ketones, ur: NEGATIVE
Nitrite: NEGATIVE
RBC / HPF: NONE SEEN (ref 0–?)
Specific Gravity, Urine: 1.01 (ref 1.000–1.030)
Total Protein, Urine: NEGATIVE
Urine Glucose: NEGATIVE
Urobilinogen, UA: 0.2 (ref 0.0–1.0)
pH: 7.5 (ref 5.0–8.0)

## 2021-02-11 LAB — CBC WITH DIFFERENTIAL/PLATELET
Basophils Absolute: 0.1 10*3/uL (ref 0.0–0.1)
Basophils Relative: 0.8 % (ref 0.0–3.0)
Eosinophils Absolute: 0.1 10*3/uL (ref 0.0–0.7)
Eosinophils Relative: 1.2 % (ref 0.0–5.0)
HCT: 41.7 % (ref 36.0–46.0)
Hemoglobin: 14.1 g/dL (ref 12.0–15.0)
Lymphocytes Relative: 31.6 % (ref 12.0–46.0)
Lymphs Abs: 2 10*3/uL (ref 0.7–4.0)
MCHC: 33.9 g/dL (ref 30.0–36.0)
MCV: 93.2 fl (ref 78.0–100.0)
Monocytes Absolute: 0.7 10*3/uL (ref 0.1–1.0)
Monocytes Relative: 10.5 % (ref 3.0–12.0)
Neutro Abs: 3.6 10*3/uL (ref 1.4–7.7)
Neutrophils Relative %: 55.9 % (ref 43.0–77.0)
Platelets: 295 10*3/uL (ref 150.0–400.0)
RBC: 4.47 Mil/uL (ref 3.87–5.11)
RDW: 12.9 % (ref 11.5–15.5)
WBC: 6.4 10*3/uL (ref 4.0–10.5)

## 2021-02-11 LAB — BASIC METABOLIC PANEL
BUN: 12 mg/dL (ref 6–23)
CO2: 27 mEq/L (ref 19–32)
Calcium: 9.5 mg/dL (ref 8.4–10.5)
Chloride: 100 mEq/L (ref 96–112)
Creatinine, Ser: 0.82 mg/dL (ref 0.40–1.20)
GFR: 68.85 mL/min (ref >60.00)
Glucose, Bld: 87 mg/dL (ref 70–99)
Potassium: 3.9 mEq/L (ref 3.5–5.1)
Sodium: 138 mEq/L (ref 135–145)

## 2021-02-11 LAB — TSH: TSH: 1.7 u[IU]/mL (ref 0.35–4.50)

## 2021-02-11 LAB — LIPID PANEL
Cholesterol: 181 mg/dL (ref 0–200)
HDL: 66.7 mg/dL (ref >39.00)
LDL Cholesterol: 102 mg/dL — ABNORMAL HIGH (ref 0–99)
NonHDL: 113.86
Total CHOL/HDL Ratio: 3
Triglycerides: 60 mg/dL (ref 0.0–149.0)
VLDL: 12 mg/dL (ref 0.0–40.0)

## 2021-02-11 LAB — HEMOGLOBIN A1C: Hgb A1c MFr Bld: 5.2 % (ref 4.6–6.5)

## 2021-02-11 LAB — VITAMIN D 25 HYDROXY (VIT D DEFICIENCY, FRACTURES): VITD: 57.97 ng/mL (ref 30.00–100.00)

## 2021-02-11 NOTE — Assessment & Plan Note (Signed)
Last vitamin D Lab Results  Component Value Date   VD25OH 57.97 02/11/2021   Stable, cont oral replacement 

## 2021-02-11 NOTE — Progress Notes (Signed)
Patient ID: [name] Bell, female   DOB: 1941-02-03, 80 y.o.   MRN: [id number]         Chief Complaint:: yearly exam and Follow-up  low vit d, hyperglycemia, hld       HPI:  [name] Bell is a 80 y.o. female here for wellness exam and is up to date.  Has no new complaiints, Pt denies chest pain, increased sob or doe, wheezing, orthopnea, PND, increased LE swelling, palpitations, dizziness or syncope.  No new focal neuro s/s.   Pt denies polydipsia, polyuria,    Wt Readings from Last 3 Encounters:  02/11/21 167 lb 3.2 oz (75.8 kg)  08/08/20 164 lb (74.4 kg)  06/28/20 166 lb (75.3 kg)   BP Readings from Last 3 Encounters:  02/11/21 124/70  08/08/20 122/68  06/28/20 140/90   Immunization History  Administered Date(s) Administered  . Fluad Quad(high Dose 65+) 09/03/2019  . Influenza Split 09/29/2011, 10/21/2012  . Influenza Whole 09/25/2009, 09/30/2010  . Influenza, High Dose Seasonal PF 11/23/2013, 10/18/2015, 12/02/2016, 11/25/2017, 10/07/2018  . Influenza,inj,Quad PF,6+ Mos 09/21/2014  . Influenza,inj,quad, With Preservative 09/03/2019  . PFIZER(Purple Top)SARS-COV-2 Vaccination 01/19/2020, 02/09/2020, 10/03/2020  . Pneumococcal Conjugate-13 10/24/2014  . Pneumococcal Polysaccharide-23 12/29/2006, 06/22/2018  . Td 03/30/2007  . Tdap 11/25/2017  . Zoster 09/29/2011   Health Maintenance Due  Topic Date Due  . Hepatitis C Screening  Never done          Past Medical History:  Diagnosis Date  . 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 ALLERGIC RHINITIS 01/24/2008  . Allergy   . ANEMIA-IRON DEFICIENCY 01/24/2008   pt denied  . ANXIETY 02/26/2009  . BACK PAIN 09/30/2010  . Breast cancer ([place])   . 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 DIZZINESS 02/28/2010  . ELEVATED BLOOD PRESSURE WITHOUT DIAGNOSIS OF HYPERTENSION 02/26/2009   patient denies  . FATIGUE 02/26/2009  . GERD 01/24/2008  .  History of blood transfusion ectopic pregnancy  . Neuromuscular disorder ([place])   . SHINGLES 11/06/2010  . TOE PAIN 09/30/2010  . VARICOSE VEINS, LOWER EXTREMITIES 08/16/2008  . 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 AUGMENTATION MAMMAPLASTY Bilateral 10/09/2016  . BACK SURGERY  2010   fusion  . BREAST LUMPECTOMY Right 05/14/2015   Procedure: RIGHT BREAST LUMPECTOMY;  Surgeon: Alphonsa Overall, MD;  Location: [facility];  Service: General;  Laterality: Right;  . BREAST RECONSTRUCTION WITH PLACEMENT OF TISSUE EXPANDER AND FLEX HD (ACELLULAR HYDRATED DERMIS) Right 10/09/2016   Procedure: IMMEDIATE RIGHT BREAST RECONSTRUCTION WITH PLACEMENT OF SILICONE GEL IMPLANTS OR TISSUE EXPANDER AND ACELLULAR DERMAL MATRIX;  Surgeon: Crissie Reese, MD;  Location: [facility];  Service: Plastics;  Laterality: Right;  . ECTOPIC PREGNANCY SURGERY     at 80 yo with salpingectomy  . FOOT SURGERY Right    bunion, hammer toe.  x 2  . FOOT SURGERY Right 05/2016   3 surgeries on right foot.  . INCISION AND DRAINAGE OF WOUND Right 10/09/2016   Procedure: IRRIGATION AND DEBRIDEMENT BREAST HEMATOMA;  Surgeon: Crissie Reese, MD;  Location: [facility];  Service: Plastics;  Laterality: Right;  . MASTECTOMY Right 10/09/2016  . MASTECTOMY W/ SENTINEL NODE BIOPSY Right 10/09/2016  . MASTECTOMY W/ SENTINEL NODE BIOPSY Right 10/09/2016   Procedure: RIGHT MASTECTOMY WITH SENTINEL LYMPH NODE BIOPSY;  Surgeon: Alphonsa Overall, MD;  Location: [facility];  Service: General;  Laterality: Right;  . s/p bilateral breasts implants  12/2007  . s/p lumbar surgury  july 2010  . TONSILLECTOMY      reports that she has never smoked. She has never used smokeless tobacco. She reports current alcohol use of about 6.0 standard drinks of alcohol per week. She reports that she does not use drugs. family history includes Asthma in her brother; Lung cancer in her father. Allergies  Allergen Reactions  . Advil [Ibuprofen]      Per pt, makes her feel "confused when she takes Advil with meds  . Iron     Oxidized iron-makes hand and body break out when touched.  . No Known Allergies    Current Outpatient Medications on File Prior to Visit  Medication Sig Dispense Refill  . acetaminophen (TYLENOL) 325 MG tablet Take 650 mg by mouth every 6 (six) hours as needed.    . Cholecalciferol (D3 VITAMIN PO)     . clonazePAM (KLONOPIN) 1 MG tablet TAKE ONE (1) TABLET BY MOUTH TWO (2) TIMES DAILY 180 tablet 1  . Cyanocobalamin (VITAMIN B-12) 1000 MCG SUBL Place under the tongue. Vit b-12 1000 mg with Folate 400 mg-Take one daily    . fluticasone (FLONASE) 50 MCG/ACT nasal spray USE 2 SPRAYS INTO BOTH NOSTRILS DAILY 16 g 2  . hydrochlorothiazide (HYDRODIURIL) 25 MG tablet TAKE ONE (1) TABLET BY MOUTH EVERY DAY 90 tablet 0  . methocarbamol (ROBAXIN) 500 MG tablet SMARTSIG:1 Tablet(s) By Mouth 4-5 Times Daily    . OVER THE COUNTER MEDICATION Drinks beet juice daily.    Marland Kitchen OVER THE COUNTER MEDICATION Drinks vegetable juice daily.    . traMADol (ULTRAM) 50 MG tablet tramadol 50 mg tablet    . traZODone (DESYREL) 50 MG tablet Take 1 tablet (50 mg total) by mouth at bedtime. 90 tablet 1  . tretinoin (RETIN-A) 0.025 % cream Apply topically at bedtime. 45 g 0  . docusate sodium (COLACE) 100 MG capsule Take 100 mg by mouth 2 (two) times daily. (Patient not taking: Reported on 02/11/2021)    . glucosamine-chondroitin 500-400 MG tablet Take 1 tablet by mouth daily. (Patient not taking: Reported on 02/11/2021)    . HYDROcodone-acetaminophen (NORCO) 7.5-325 MG tablet Take by mouth.    . levofloxacin (LEVAQUIN) 750 MG tablet levofloxacin 750 mg tablet    . RETIN-A 0.05 % cream APPLY TO AFFECTED AREA(S) AT BEDTIME. (Patient not taking: Reported on 02/11/2021) 45 g 1  . tiZANidine (ZANAFLEX) 2 MG tablet Take 1 tablet (2 mg total) by mouth every 6 (six) hours as needed for muscle spasms. 30 tablet 5  . triamcinolone (KENALOG) 0.1 % Apply  topically.    . TURMERIC PO Take by mouth. Turmeric and Ginger -Take one pill tid (Patient not taking: Reported on 02/11/2021)     No current facility-administered medications on file prior to visit.        ROS:  All others reviewed and negative.  Objective        PE:  BP 124/70   Pulse 60   Temp 98.2 F (36.8 C) (Oral)   Ht 5\' 4"  (1.626 m)   Wt 167 lb 3.2 oz (75.8 kg)   SpO2 98%   BMI 28.70 kg/m                 Constitutional: Pt appears in NAD               HENT: Head: NCAT.  Right Ear: External ear normal.                 Left Ear: External ear normal.                Eyes: . Pupils are equal, round, and reactive to light. Conjunctivae and EOM are normal               Nose: without d/c or deformity               Neck: Neck supple. Gross normal ROM               Cardiovascular: Normal rate and regular rhythm.                 Pulmonary/Chest: Effort normal and breath sounds without rales or wheezing.                Abd:  Soft, NT, ND, + BS, no organomegaly               Neurological: Pt is alert. At baseline orientation, motor grossly intact               Skin: Skin is warm. No rashes, no other new lesions, LE edema - none               Psychiatric: Pt behavior is normal without agitation   Micro: none  Cardiac tracings I have personally interpreted today:  none  Pertinent Radiological findings (summarize): none   Lab Results  Component Value Date   WBC 6.4 02/11/2021   HGB 14.1 02/11/2021   HCT 41.7 02/11/2021   PLT 295.0 02/11/2021   GLUCOSE 87 02/11/2021   CHOL 181 02/11/2021   TRIG 60.0 02/11/2021   HDL 66.70 02/11/2021   LDLDIRECT 128.5 07/04/2013   LDLCALC 102 (H) 02/11/2021   ALT 19 02/11/2021   AST 21 02/11/2021   NA 138 02/11/2021   K 3.9 02/11/2021   CL 100 02/11/2021   CREATININE 0.82 02/11/2021   BUN 12 02/11/2021   CO2 27 02/11/2021   TSH 1.70 02/11/2021   INR 0.9 07/17/2009   HGBA1C 5.2 02/11/2021   Assessment/Plan:  Jahnya  Thorlund Cantrell is a 80 y.o. 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  Hyperlipidemia Lab Results  Component Value Date   LDLCALC 102 (H) 02/11/2021   Stable, pt to continue current low chol diet, declines statin   Impaired glucose tolerance Lab Results  Component Value Date   LDLCALC 102 (H) 02/11/2021   Stable, pt to continue current statin  - diet   Vitamin D deficiency Last vitamin D Lab Results  Component Value Date   VD25OH 57.97 02/11/2021   Stable, cont oral replacement   Followup: Return in about 6 months (around 08/11/2021).  Cathlean Cower, MD 02/11/2021 9:22 PM [facility]

## 2021-02-11 NOTE — Assessment & Plan Note (Signed)
Lab Results  Component Value Date   LDLCALC 102 (H) 02/11/2021   Stable, pt to continue current statin  - diet

## 2021-02-11 NOTE — Assessment & Plan Note (Signed)
Lab Results  Component Value Date   LDLCALC 102 (H) 02/11/2021   Stable, pt to continue current low chol diet, declines statin

## 2021-02-11 NOTE — Patient Instructions (Signed)

## 2021-02-12 LAB — HEPATITIS C ANTIBODY
Hepatitis C Ab: NONREACTIVE
SIGNAL TO CUT-OFF: 0.02 (ref <1.00)

## 2021-02-25 ENCOUNTER — Telehealth: Payer: Self-pay | Admitting: Neurology

## 2021-02-25 NOTE — Telephone Encounter (Signed)
Left side back of head and neck hurts more than normal after last injection. She said she has been using Trazodone 50mg  to help with this pain off and on.   She just wants Dr. Carles Collet to be aware.

## 2021-02-25 NOTE — Telephone Encounter (Signed)
noted 

## 2021-03-01 ENCOUNTER — Ambulatory Visit (INDEPENDENT_AMBULATORY_CARE_PROVIDER_SITE_OTHER): Payer: Medicare Other | Admitting: Neurology

## 2021-03-01 ENCOUNTER — Other Ambulatory Visit: Payer: Self-pay

## 2021-03-01 DIAGNOSIS — G243 Spasmodic torticollis: Secondary | ICD-10-CM | POA: Diagnosis not present

## 2021-03-01 MED ORDER — ONABOTULINUMTOXINA 100 UNITS IJ SOLR
400.0000 [IU] | Freq: Once | INTRAMUSCULAR | Status: AC
  Administered 2021-03-01: 400 [IU] via INTRAMUSCULAR

## 2021-03-01 MED ORDER — TRAZODONE HCL 50 MG PO TABS: ORAL_TABLET | ORAL | 1 refills | Status: DC

## 2021-03-01 MED ORDER — ONABOTULINUMTOXINA 100 UNITS IJ SOLR
330.0000 [IU] | Freq: Once | INTRAMUSCULAR | Status: AC
  Administered 2021-03-01: 330 [IU] via INTRAMUSCULAR

## 2021-03-01 NOTE — Addendum Note (Signed)
Addended by: Alonza Bogus S on: 03/01/2021 12:41 PM   Modules accepted: Orders

## 2021-03-01 NOTE — Addendum Note (Signed)
Addended by: Ulice Brilliant T on: 03/01/2021 01:09 PM   Modules accepted: Orders

## 2021-03-01 NOTE — Procedures (Signed)
[facility]   Procedure Note Botox  Attending: Dr. Wells Guiles Ezra Denne  Preoperative Diagnosis(es): Cervical Dystonia  Result History  Onset of effect: immediately Side effects:  Dysphagia so am going to d/c levator scapulae m injections   Consent obtained from: The patient  Benefits discussed included, but were not limited to decreased muscle tightness, increased joint range of motion, and decreased pain.  Risk discussed included, but were not limited pain and discomfort, bleeding, bruising, excessive weakness, venous thrombosis, muscle atrophy and dysphagia.  A copy of the patient medication guide was given to the patient which explains the blackbox warning.  Informed consent was obtained  Patients identity and treatment sites confirmed yes.     Details of Procedure: Skin was cleaned with alcohol.  A 30 gauge, 1/2 inch needle was introduced to the target muscle (except splenius capitus, posterior approach, where 27 inch, 1 1/2 gauge needle was used).  Prior to injection, the needle plunger was aspirated to make sure the needle was not within a blood vessel.  There was no blood retrieved on aspiration.    Following is a summary of the muscles injected  And the amount of Botulinum toxin used:   Dilution 0.9% preservative free saline mixed with 100 u Botox type A to make 10 U per 0.1cc  Injections  Location Left  Right Units Number of sites        Sternocleidomastoid 60+40 0 100 1  Splenius Capitus, posterior approach 0 100 100 1  Splenius Capitus, lateral approach 0 60 60 1  Levator Scapulae 30  30   Trapezius 10/18/19  50         TOTAL UNITS:   340    Agent: Botulinum Type A ( Onobotulinum Toxin type A ). 4 vials of Botox were used, containing 100 units and freshly diluted with 1 mL of sterile, non-preserved saline    Total injected (Units): 340  Total wasted (Units): 60   With injection of R posterior splenius capitus injection she reported feeling paresthesia/pain the in  the left leg and felt vagal.  She was given water and sat for a while and all sx's resolved and was at baseline when she left Reinjection is anticipated in 3 months. Clinical comment:  Pt reports that she started taking trazodone, 50 mg, 1/2 tab in the AM in addition to her full tab at bed.  States that it is not making her sleepy and thinks it helps neck.  Needs refill.  Will need to monitor that with age

## 2021-03-04 ENCOUNTER — Telehealth: Payer: Self-pay | Admitting: Neurology

## 2021-03-04 NOTE — Telephone Encounter (Signed)
Patient came in Friday for Botox. She says she has had a headache and a swollen spot on her neck that is painful. She also said there is a spot on her leg that is painful as well. She is wondering if there is anything she can take for it?

## 2021-03-04 NOTE — Telephone Encounter (Signed)
Pt advised she can Tylenol or Ibuprophen for her pain, also recommended getting a massage or trying  Physical therapy, pt said no to both said she would use tylenol to see if it help.

## 2021-03-04 NOTE — Telephone Encounter (Signed)
Strange that her leg would hurt!  Tylenol or ibuprofen is fine (she already has klonopin, trazodone, zanaflex).  Massage is okay for the neck and even PT if she wants that.

## 2021-03-05 ENCOUNTER — Other Ambulatory Visit: Payer: Self-pay | Admitting: Internal Medicine

## 2021-04-03 DIAGNOSIS — L308 Other specified dermatitis: Secondary | ICD-10-CM | POA: Diagnosis not present

## 2021-05-23 DIAGNOSIS — M79671 Pain in right foot: Secondary | ICD-10-CM | POA: Diagnosis not present

## 2021-05-30 DIAGNOSIS — M792 Neuralgia and neuritis, unspecified: Secondary | ICD-10-CM | POA: Diagnosis not present

## 2021-05-30 DIAGNOSIS — S9031XA Contusion of right foot, initial encounter: Secondary | ICD-10-CM | POA: Diagnosis not present

## 2021-05-30 DIAGNOSIS — M21961 Unspecified acquired deformity of right lower leg: Secondary | ICD-10-CM | POA: Diagnosis not present

## 2021-05-30 DIAGNOSIS — M21962 Unspecified acquired deformity of left lower leg: Secondary | ICD-10-CM | POA: Diagnosis not present

## 2021-05-31 ENCOUNTER — Other Ambulatory Visit: Payer: Self-pay | Admitting: Internal Medicine

## 2021-05-31 ENCOUNTER — Other Ambulatory Visit: Payer: Self-pay

## 2021-05-31 ENCOUNTER — Ambulatory Visit (INDEPENDENT_AMBULATORY_CARE_PROVIDER_SITE_OTHER): Payer: Medicare Other | Admitting: Neurology

## 2021-05-31 DIAGNOSIS — G243 Spasmodic torticollis: Secondary | ICD-10-CM | POA: Diagnosis not present

## 2021-05-31 MED ORDER — ONABOTULINUMTOXINA 100 UNITS IJ SOLR
330.0000 [IU] | Freq: Once | INTRAMUSCULAR | Status: AC
  Administered 2021-05-31: 330 [IU] via INTRAMUSCULAR

## 2021-05-31 NOTE — Procedures (Signed)
[facility]   Procedure Note Botox  Attending: Dr. Wells Guiles Talaya Lamprecht  Preoperative Diagnosis(es): Cervical Dystonia  Result History  Onset of effect: immediately    Consent obtained from: The patient  Benefits discussed included, but were not limited to decreased muscle tightness, increased joint range of motion, and decreased pain.  Risk discussed included, but were not limited pain and discomfort, bleeding, bruising, excessive weakness, venous thrombosis, muscle atrophy and dysphagia.  A copy of the patient medication guide was given to the patient which explains the blackbox warning.  Informed consent was obtained  Patients identity and treatment sites confirmed yes.     Details of Procedure: Skin was cleaned with alcohol.  A 30 gauge, 1/2 inch needle was introduced to the target muscle (except splenius capitus, posterior approach, where 27 inch, 1 1/2 gauge needle was used).  Prior to injection, the needle plunger was aspirated to make sure the needle was not within a blood vessel.  There was no blood retrieved on aspiration.    Following is a summary of the muscles injected  And the amount of Botulinum toxin used:   Dilution 0.9% preservative free saline mixed with 100 u Botox type A to make 10 U per 0.1cc  Injections  Location Left  Right Units Number of sites        Sternocleidomastoid 60+40 0 100 1  Splenius Capitus, posterior approach 0 100 100 1  Splenius Capitus, lateral approach 0 60 60 1  Levator Scapulae      Trapezius 10/18/19/20  70         TOTAL UNITS:   330    Agent: Botulinum Type A ( Onobotulinum Toxin type A ). 4 vials of Botox were used, containing 100 units and freshly diluted with 1 mL of sterile, non-preserved saline    Total injected (Units): 330  Total wasted (Units): 0   Did well with injections.

## 2021-06-10 DIAGNOSIS — H00021 Hordeolum internum right upper eyelid: Secondary | ICD-10-CM | POA: Diagnosis not present

## 2021-06-11 DIAGNOSIS — Z23 Encounter for immunization: Secondary | ICD-10-CM | POA: Diagnosis not present

## 2021-06-12 DIAGNOSIS — S9031XA Contusion of right foot, initial encounter: Secondary | ICD-10-CM | POA: Diagnosis not present

## 2021-06-12 DIAGNOSIS — L84 Corns and callosities: Secondary | ICD-10-CM | POA: Diagnosis not present

## 2021-06-12 DIAGNOSIS — M21961 Unspecified acquired deformity of right lower leg: Secondary | ICD-10-CM | POA: Diagnosis not present

## 2021-06-12 DIAGNOSIS — M21962 Unspecified acquired deformity of left lower leg: Secondary | ICD-10-CM | POA: Diagnosis not present

## 2021-06-17 DIAGNOSIS — H00021 Hordeolum internum right upper eyelid: Secondary | ICD-10-CM | POA: Diagnosis not present

## 2021-06-17 DIAGNOSIS — H0102A Squamous blepharitis right eye, upper and lower eyelids: Secondary | ICD-10-CM | POA: Diagnosis not present

## 2021-06-17 DIAGNOSIS — H35363 Drusen (degenerative) of macula, bilateral: Secondary | ICD-10-CM | POA: Diagnosis not present

## 2021-06-17 DIAGNOSIS — H353131 Nonexudative age-related macular degeneration, bilateral, early dry stage: Secondary | ICD-10-CM | POA: Diagnosis not present

## 2021-06-17 DIAGNOSIS — H0102B Squamous blepharitis left eye, upper and lower eyelids: Secondary | ICD-10-CM | POA: Diagnosis not present

## 2021-06-28 DIAGNOSIS — H00021 Hordeolum internum right upper eyelid: Secondary | ICD-10-CM | POA: Diagnosis not present

## 2021-07-04 ENCOUNTER — Other Ambulatory Visit: Payer: Self-pay | Admitting: Neurology

## 2021-07-05 NOTE — Telephone Encounter (Signed)
Pt called in wanting a refill on her Clonazepam

## 2021-07-06 ENCOUNTER — Other Ambulatory Visit: Payer: Self-pay | Admitting: Neurology

## 2021-07-25 DIAGNOSIS — M21962 Unspecified acquired deformity of left lower leg: Secondary | ICD-10-CM | POA: Diagnosis not present

## 2021-07-25 DIAGNOSIS — M21961 Unspecified acquired deformity of right lower leg: Secondary | ICD-10-CM | POA: Diagnosis not present

## 2021-07-25 DIAGNOSIS — M792 Neuralgia and neuritis, unspecified: Secondary | ICD-10-CM | POA: Diagnosis not present

## 2021-07-25 DIAGNOSIS — L84 Corns and callosities: Secondary | ICD-10-CM | POA: Diagnosis not present

## 2021-07-31 DIAGNOSIS — H25013 Cortical age-related cataract, bilateral: Secondary | ICD-10-CM | POA: Diagnosis not present

## 2021-08-12 ENCOUNTER — Ambulatory Visit: Payer: Medicare Other | Admitting: Internal Medicine

## 2021-08-14 DIAGNOSIS — Z6830 Body mass index (BMI) 30.0-30.9, adult: Secondary | ICD-10-CM | POA: Diagnosis not present

## 2021-08-14 DIAGNOSIS — N952 Postmenopausal atrophic vaginitis: Secondary | ICD-10-CM | POA: Diagnosis not present

## 2021-08-14 DIAGNOSIS — Z01419 Encounter for gynecological examination (general) (routine) without abnormal findings: Secondary | ICD-10-CM | POA: Diagnosis not present

## 2021-08-16 ENCOUNTER — Other Ambulatory Visit: Payer: Self-pay | Admitting: Internal Medicine

## 2021-08-16 NOTE — Telephone Encounter (Signed)
Please refill as per office routine med refill policy (all routine meds refilled for 3 mo or monthly per pt preference up to one year from last visit, then month to month grace period for 3 mo, then further med refills will have to be denied)  

## 2021-08-23 ENCOUNTER — Other Ambulatory Visit: Payer: Self-pay | Admitting: Internal Medicine

## 2021-08-23 DIAGNOSIS — Z1231 Encounter for screening mammogram for malignant neoplasm of breast: Secondary | ICD-10-CM

## 2021-08-26 ENCOUNTER — Ambulatory Visit (INDEPENDENT_AMBULATORY_CARE_PROVIDER_SITE_OTHER): Payer: Medicare Other | Admitting: Internal Medicine

## 2021-08-26 ENCOUNTER — Encounter: Payer: Self-pay | Admitting: Internal Medicine

## 2021-08-26 ENCOUNTER — Other Ambulatory Visit: Payer: Self-pay

## 2021-08-26 VITALS — BP 118/70 | HR 60 | Temp 98.0°F | Ht 64.0 in | Wt 168.6 lb

## 2021-08-26 DIAGNOSIS — H1013 Acute atopic conjunctivitis, bilateral: Secondary | ICD-10-CM

## 2021-08-26 DIAGNOSIS — E7849 Other hyperlipidemia: Secondary | ICD-10-CM

## 2021-08-26 DIAGNOSIS — E559 Vitamin D deficiency, unspecified: Secondary | ICD-10-CM

## 2021-08-26 DIAGNOSIS — R7302 Impaired glucose tolerance (oral): Secondary | ICD-10-CM

## 2021-08-26 DIAGNOSIS — Z23 Encounter for immunization: Secondary | ICD-10-CM | POA: Diagnosis not present

## 2021-08-26 NOTE — Patient Instructions (Addendum)
You had the flu shot today  Please consider the Shingrix shingles  Please consider trying the OTC Zaditor (like zyrtec for the eyes) for probable eye allergies  Please continue all other medications as before, and refills have been done if requested.  Please have the pharmacy call with any other refills you may need.  Please continue your efforts at being more active, low cholesterol diet, and weight control.  Please keep your appointments with your specialists as you may have planned  Please make an Appointment to return in 6 months, or sooner if needed

## 2021-08-26 NOTE — Progress Notes (Signed)
Patient ID: [name] Bell, female   DOB: 01/07/1941, 80 y.o.   MRN: [id number]        Chief Complaint: follow up HLD and hyperglycemia, low vit d, allergic conjunctivitis       HPI:  [name] Bell is a 80 y.o. female here overall doing ok, taking Vit D.  Has new onset recent eye itching and weepiness clearish fluid x 1 mo, constant, worse with outdoors, nothing else makes better or worse.  Trying to follow lower chol diet.  For cataract surgury soon.  Pt denies chest pain, increased sob or doe, wheezing, orthopnea, PND, increased LE swelling, palpitations, dizziness or syncope.   Pt denies polydipsia, polyuria, or new focal neuro s/s.  Conts to follow with neurolog for torticollis and tolerating tx well.   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) 01/24/2008   ALLERGIC RHINITIS 01/24/2008   Allergy    ANEMIA-IRON DEFICIENCY 01/24/2008   pt denied   ANXIETY 02/26/2009   BACK PAIN 09/30/2010   Breast cancer ([place])    Cancer of central portion of female breast ([place]) 05/04/2015   right breast/ no chemo or radiation done   DIZZINESS 02/28/2010   ELEVATED BLOOD PRESSURE WITHOUT DIAGNOSIS OF HYPERTENSION 02/26/2009   patient denies   FATIGUE 02/26/2009   GERD 01/24/2008   History of blood transfusion ectopic pregnancy   Neuromuscular disorder ([place])    SHINGLES 11/06/2010   TOE PAIN 09/30/2010   VARICOSE VEINS, LOWER EXTREMITIES 08/16/2008   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   Past Surgical History:  Procedure Laterality Date   AUGMENTATION MAMMAPLASTY Bilateral 10/09/2016   BACK SURGERY  2010   fusion    BREAST LUMPECTOMY Right 05/14/2015   Procedure: RIGHT BREAST LUMPECTOMY;  Surgeon: Alphonsa Overall, MD;  Location: [facility];  Service: General;  Laterality: Right;   BREAST RECONSTRUCTION WITH PLACEMENT OF TISSUE EXPANDER AND FLEX HD (ACELLULAR HYDRATED DERMIS) Right 10/09/2016   Procedure: IMMEDIATE RIGHT BREAST RECONSTRUCTION WITH PLACEMENT OF SILICONE GEL IMPLANTS OR TISSUE EXPANDER AND ACELLULAR DERMAL MATRIX;  Surgeon: Crissie Reese, MD;  Location: [facility];  Service: Plastics;  Laterality: Right;   ECTOPIC PREGNANCY SURGERY     at 80 yo with salpingectomy   FOOT SURGERY Right    bunion, hammer toe.  x 2   FOOT SURGERY Right 05/2016   3 surgeries on right foot.   INCISION AND DRAINAGE OF WOUND Right 10/09/2016   Procedure: IRRIGATION AND DEBRIDEMENT BREAST HEMATOMA;  Surgeon: Crissie Reese, MD;  Location: [facility];  Service: Plastics;  Laterality: Right;   MASTECTOMY Right 10/09/2016   MASTECTOMY W/ SENTINEL NODE BIOPSY Right 10/09/2016   MASTECTOMY W/ SENTINEL NODE BIOPSY Right 10/09/2016   Procedure: RIGHT MASTECTOMY WITH SENTINEL LYMPH NODE BIOPSY;  Surgeon: Alphonsa Overall, MD;  Location: [facility];  Service: General;  Laterality: Right;   s/p bilateral breasts implants  12/2007   s/p lumbar surgury  july 2010   TONSILLECTOMY      reports that she has never smoked. She has never used  smokeless tobacco. She reports current alcohol use of about 6.0 standard drinks per week. She reports that she does not use drugs. family history includes Asthma in her brother; Lung cancer in her father. Allergies  Allergen Reactions   Advil [Ibuprofen]     Per pt, makes her feel "confused when she takes Advil with meds   Iron     Oxidized iron-makes hand and body break out when touched.   No Known Allergies    Aluminum Rash   Current Outpatient Medications on File Prior to Visit  Medication Sig Dispense Refill   acetaminophen (TYLENOL) 325 MG tablet Take 650 mg by mouth every 6 (six) hours as needed.      Cholecalciferol (D3 VITAMIN PO)      clonazePAM (KLONOPIN) 1 MG tablet TAKE ONE (1) TABLET BY MOUTH TWO (2) TIMES DAILY 120 tablet 0   Cyanocobalamin (VITAMIN B-12) 1000 MCG SUBL Place under the tongue. Vit b-12 1000 mg with Folate 400 mg-Take one daily     fluticasone (FLONASE) 50 MCG/ACT nasal spray USE 2 SPRAYS INTO BOTH NOSTRILS DAILY 16 g 2   glucosamine-chondroitin 500-400 MG tablet Take 1 tablet by mouth daily.     hydrochlorothiazide (HYDRODIURIL) 25 MG tablet TAKE ONE (1) TABLET BY MOUTH EVERY DAY 90 tablet 0   OVER THE COUNTER MEDICATION Drinks beet juice daily.     OVER THE COUNTER MEDICATION Drinks vegetable juice daily.     RETIN-A 0.05 % cream APPLY TO AFFECTED AREA(S) AT BEDTIME. 45 g 1   traZODone (DESYREL) 50 MG tablet TAKE 1/2 TABLET BY MOUTH IN THE MORNING AND 1 TABLET AT BEDTIME 135 tablet 0   triamcinolone (KENALOG) 0.1 % Apply topically.     TURMERIC PO Take by mouth. Turmeric and Ginger -Take one pill tid     No current facility-administered medications on file prior to visit.        ROS:  All others reviewed and negative.  Objective        PE:  BP 118/70 (BP Location: Left Arm, Patient Position: Sitting, Cuff Size: Large)   Pulse 60   Temp 98 F (36.7 C) (Oral)   Ht '5\' 4"'$  (1.626 m)   Wt 168 lb 9.6 oz (76.5 kg)   SpO2 96%   BMI 28.94 kg/m                 Constitutional: Pt appears in NAD               HENT: Head: NCAT.                Right Ear: External ear normal.                 Left Ear: External ear normal.                Eyes: . Pupils are equal, round, and reactive to light. Conjunctivae with mild weepiness clearish fluid, and EOM are normal               Nose: without d/c or deformity               Neck: Neck supple. Gross normal ROM               Cardiovascular: Normal rate and regular rhythm.                 Pulmonary/Chest: Effort normal and breath sounds without rales or wheezing.  Abd:  Soft, NT, ND, + BS, no organomegaly                Neurological: Pt is alert. At baseline orientation, motor grossly intact               Skin: Skin is warm. No rashes, no other new lesions, LE edema - none               Psychiatric: Pt behavior is normal without agitation   Micro: none  Cardiac tracings I have personally interpreted today:  none  Pertinent Radiological findings (summarize): none   Lab Results  Component Value Date   WBC 6.4 02/11/2021   HGB 14.1 02/11/2021   HCT 41.7 02/11/2021   PLT 295.0 02/11/2021   GLUCOSE 87 02/11/2021   CHOL 181 02/11/2021   TRIG 60.0 02/11/2021   HDL 66.70 02/11/2021   LDLDIRECT 128.5 07/04/2013   LDLCALC 102 (H) 02/11/2021   ALT 19 02/11/2021   AST 21 02/11/2021   NA 138 02/11/2021   K 3.9 02/11/2021   CL 100 02/11/2021   CREATININE 0.82 02/11/2021   BUN 12 02/11/2021   CO2 27 02/11/2021   TSH 1.70 02/11/2021   INR 0.9 07/17/2009   HGBA1C 5.2 02/11/2021   Assessment/Plan:  Cherrelle Thorlund Schuth is a 80 y.o. White or Caucasian [1] female with  has a past medical history of Abnormal involuntary movements(781.0) (01/24/2008), ALLERGIC RHINITIS (01/24/2008), Allergy, ANEMIA-IRON DEFICIENCY (01/24/2008), ANXIETY (02/26/2009), BACK PAIN (09/30/2010), Breast cancer ([place]), Cancer of central portion of female breast ([place]) (05/04/2015), DIZZINESS (02/28/2010), ELEVATED BLOOD PRESSURE WITHOUT DIAGNOSIS OF HYPERTENSION (02/26/2009), FATIGUE (02/26/2009), GERD (01/24/2008), History of blood transfusion (ectopic pregnancy), Neuromuscular disorder ([place]), SHINGLES (11/06/2010), TOE PAIN (09/30/2010), VARICOSE VEINS, LOWER EXTREMITIES (08/16/2008), and Vertigo of central origin (02/26/2009).  Vitamin D deficiency Last vitamin D Lab Results  Component Value Date   VD25OH 57.97 02/11/2021   Stable, cont oral replacement  Impaired glucose tolerance Lab Results  Component Value Date   HGBA1C 5.2 02/11/2021   Stable, pt to continue current medical treatment  - diet   Hyperlipidemia Lab Results   Component Value Date   LDLCALC 102 (H) 02/11/2021   Uncontrolled, goal ldl < 70, pt decines statin   Allergic conjunctivitis Mild to mod, for otc zaditor prn, to f/u any worsening symptoms or concerns  Followup: Return in about 6 months (around 02/25/2022).  Cathlean Cower, MD 08/29/2021 [facility]

## 2021-08-29 ENCOUNTER — Encounter: Payer: Self-pay | Admitting: Internal Medicine

## 2021-08-29 DIAGNOSIS — H1010 Acute atopic conjunctivitis, unspecified eye: Secondary | ICD-10-CM | POA: Insufficient documentation

## 2021-08-29 NOTE — Assessment & Plan Note (Signed)
Lab Results  Component Value Date   LDLCALC 102 (H) 02/11/2021   Uncontrolled, goal ldl < 70, pt decines statin

## 2021-08-29 NOTE — Assessment & Plan Note (Signed)
Mild to mod, for otc zaditor prn, to f/u any worsening symptoms or concerns

## 2021-08-29 NOTE — Assessment & Plan Note (Signed)
Lab Results  Component Value Date   HGBA1C 5.2 02/11/2021   Stable, pt to continue current medical treatment  - diet

## 2021-08-29 NOTE — Assessment & Plan Note (Signed)
Last vitamin D Lab Results  Component Value Date   VD25OH 57.97 02/11/2021   Stable, cont oral replacement

## 2021-08-30 ENCOUNTER — Other Ambulatory Visit: Payer: Self-pay

## 2021-08-30 ENCOUNTER — Ambulatory Visit (INDEPENDENT_AMBULATORY_CARE_PROVIDER_SITE_OTHER): Payer: Medicare Other | Admitting: Neurology

## 2021-08-30 DIAGNOSIS — G243 Spasmodic torticollis: Secondary | ICD-10-CM

## 2021-08-30 MED ORDER — TRAZODONE HCL 50 MG PO TABS: ORAL_TABLET | ORAL | 5 refills | Status: DC

## 2021-08-30 MED ORDER — CLONAZEPAM 1 MG PO TABS: 1.0000 mg | ORAL_TABLET | Freq: Two times a day (BID) | ORAL | 5 refills | Status: DC

## 2021-08-30 MED ORDER — ONABOTULINUMTOXINA 100 UNITS IJ SOLR
330.0000 [IU] | Freq: Once | INTRAMUSCULAR | Status: AC
  Administered 2021-08-30: 330 [IU] via INTRAMUSCULAR

## 2021-08-30 NOTE — Procedures (Signed)
[facility]   Procedure Note Botox  Attending: Dr. Wells Guiles [name] Bell  Preoperative Diagnosis(es): Cervical Dystonia  Result History  Onset of effect: immediately No SE last time.  Wearing off last few week    Consent obtained from: The patient  Benefits discussed included, but were not limited to decreased muscle tightness, increased joint range of motion, and decreased pain.  Risk discussed included, but were not limited pain and discomfort, bleeding, bruising, excessive weakness, venous thrombosis, muscle atrophy and dysphagia.  A copy of the patient medication guide was given to the patient which explains the blackbox warning.  Informed consent was obtained  Patients identity and treatment sites confirmed yes.     Details of Procedure: Skin was cleaned with alcohol.  A 30 gauge, 1/2 inch needle was introduced to the target muscle (except splenius capitus, posterior approach, where 27 inch, 1 1/2 gauge needle was used).  Prior to injection, the needle plunger was aspirated to make sure the needle was not within a blood vessel.  There was no blood retrieved on aspiration.    Following is a summary of the muscles injected  And the amount of Botulinum toxin used:   Dilution 0.9% preservative free saline mixed with 100 u Botox type A to make 10 U per 0.1cc  Injections  Location Left  Right Units Number of sites        Sternocleidomastoid 60+40 0 100 1  Splenius Capitus, posterior approach 0 100 100 1  Splenius Capitus, lateral approach 0 60 60 1  Levator Scapulae      Trapezius 10/18/19/20  70         TOTAL UNITS:   330    Agent: Botulinum Type A ( Onobotulinum Toxin type A ). 4 vials of Botox were used, containing 100 units and freshly diluted with 1 mL of sterile, non-preserved saline    Total injected (Units): 330  Total wasted (Units): 0   Did well with injections.

## 2021-09-17 DIAGNOSIS — H2511 Age-related nuclear cataract, right eye: Secondary | ICD-10-CM | POA: Diagnosis not present

## 2021-09-17 DIAGNOSIS — H2513 Age-related nuclear cataract, bilateral: Secondary | ICD-10-CM | POA: Diagnosis not present

## 2021-09-17 DIAGNOSIS — H25013 Cortical age-related cataract, bilateral: Secondary | ICD-10-CM | POA: Diagnosis not present

## 2021-09-17 DIAGNOSIS — H04123 Dry eye syndrome of bilateral lacrimal glands: Secondary | ICD-10-CM | POA: Diagnosis not present

## 2021-09-17 DIAGNOSIS — H18413 Arcus senilis, bilateral: Secondary | ICD-10-CM | POA: Diagnosis not present

## 2021-09-23 ENCOUNTER — Telehealth: Payer: Medicare Other | Admitting: Family Medicine

## 2021-09-23 ENCOUNTER — Ambulatory Visit (INDEPENDENT_AMBULATORY_CARE_PROVIDER_SITE_OTHER): Payer: Medicare Other | Admitting: Family Medicine

## 2021-09-23 ENCOUNTER — Encounter: Payer: Self-pay | Admitting: Family Medicine

## 2021-09-23 VITALS — Ht 64.0 in

## 2021-09-23 DIAGNOSIS — R059 Cough, unspecified: Secondary | ICD-10-CM | POA: Diagnosis not present

## 2021-09-23 DIAGNOSIS — U071 COVID-19: Secondary | ICD-10-CM

## 2021-09-23 MED ORDER — BENZONATATE 100 MG PO CAPS: 200.0000 mg | ORAL_CAPSULE | Freq: Two times a day (BID) | ORAL | 0 refills | Status: AC | PRN

## 2021-09-23 MED ORDER — MOLNUPIRAVIR EUA 200MG CAPSULE: 4.0000 | ORAL_CAPSULE | Freq: Two times a day (BID) | ORAL | 0 refills | Status: AC

## 2021-09-23 NOTE — Progress Notes (Signed)
Virtual Visit via Telephone Note I connected with [name] Bell on 09/23/21 at  4:00 PM EDT by telephone and verified that I am speaking with the correct person using two identifiers.   I discussed the limitations, risks, security and privacy concerns of performing an evaluation and management service by telephone and the availability of in person appointments. I also discussed with the patient that there may be a patient responsible charge related to this service. The patient expressed understanding and agreed to proceed.  Location patient: home Location provider: Home office Participants present for the call: patient, provider Patient did not have a visit in the prior 7 days to address this/these issue(s).  Chief Complaint  Patient presents with   Covid Positive    Cough, headaches, diarrhea   History of Present Illness: [name] Bell is a 80 y.o.female with hx of anxiety,cervical dystonia, and allergies c/o 4 days of respiratory symptoms as described above. She started with sore throat and non productive cough Thursday night. Fatigue,nasal congestion,rhinorrhea,epiphora,and diarrhea. Today she has had 3 stools, no blood or mucus.  Negative for fever,chills,anosmia,ageusia,CP,SOB,wheezing,palpitations,abdominal pain,N/V,urinary symptoms,or skin rash. She is requesting antiviral medication. She is taking Tylenol  Sunday home COVID test was positive.  No known sick contact.  Observations/Objective: Patient sounds cheerful and well on the phone. I do not appreciate any SOB. Speech and thought processing are grossly intact. Patient reported vitals:Ht 5\' 4"  (1.626 m)   BMI 28.94 kg/m   Assessment and Plan:  [name] Bell had a virtual visit today (video/telephone)  Diagnoses and all orders for this visit:  1. COVID-19 virus infection We discussed Dx,possible complications and treatment options. She has a mild case with some risk for complications  due to age We discussed oral antiviral options and side effects. She agrees with trying Molnupiravir. Symptomatic treatment with plenty of fluids,rest,tylenol 500 mg 3-4 times per day prn. Throat lozenges if needed for sore throat.  Clearly instructed about warning signs.  - molnupiravir EUA (LAGEVRIO) 200 mg CAPS capsule; Take 4 capsules (800 mg total) by mouth 2 (two) times daily for 5 days.  Dispense: 40 capsule; Refill: 0  2. Cough Explained that cough and congestion can last a few more days and even weeks after acute symptoms have resolved. I do not think imaging is needed at this time. Benzonatate recommended for symptomatic treatment.  - benzonatate (TESSALON) 100 MG capsule; Take 2 capsules (200 mg total) by mouth 2 (two) times daily as needed for up to 10 days.  Dispense: 30 capsule; Refill: 0  Follow Up Instructions:  No follow-ups on file.  I did not refer this patient for an OV in the next 24 hours for this/these issue(s).  I discussed the assessment and treatment plan with the patient. [name] Bell was provided an opportunity to ask questions and all were answered. She agreed with the plan and demonstrated an understanding of the instructions.   The patient was advised to call back or seek an in-person evaluation if the symptoms worsen or if the condition fails to improve as anticipated.  I provided 11 minutes of non-face-to-face time during this encounter.  Kelyse Pask [place], MD

## 2021-10-03 ENCOUNTER — Other Ambulatory Visit: Payer: Self-pay

## 2021-10-03 ENCOUNTER — Ambulatory Visit
Admission: RE | Admit: 2021-10-03 | Discharge: 2021-10-03 | Disposition: A | Discharge status: Home / Self Care | Payer: Medicare Other | Source: Ambulatory Visit | Attending: Internal Medicine | Admitting: Internal Medicine

## 2021-10-03 DIAGNOSIS — Z1231 Encounter for screening mammogram for malignant neoplasm of breast: Secondary | ICD-10-CM

## 2021-10-25 ENCOUNTER — Ambulatory Visit (INDEPENDENT_AMBULATORY_CARE_PROVIDER_SITE_OTHER): Payer: Medicare Other | Admitting: Internal Medicine

## 2021-10-25 ENCOUNTER — Encounter: Payer: Self-pay | Admitting: Internal Medicine

## 2021-10-25 ENCOUNTER — Other Ambulatory Visit: Payer: Self-pay

## 2021-10-25 VITALS — BP 116/60 | HR 65 | Ht 64.0 in | Wt 170.0 lb

## 2021-10-25 DIAGNOSIS — U071 COVID-19: Secondary | ICD-10-CM

## 2021-10-25 DIAGNOSIS — E559 Vitamin D deficiency, unspecified: Secondary | ICD-10-CM

## 2021-10-25 DIAGNOSIS — H6982 Other specified disorders of Eustachian tube, left ear: Secondary | ICD-10-CM

## 2021-10-25 DIAGNOSIS — R7302 Impaired glucose tolerance (oral): Secondary | ICD-10-CM

## 2021-10-25 DIAGNOSIS — E7849 Other hyperlipidemia: Secondary | ICD-10-CM

## 2021-10-25 DIAGNOSIS — H9202 Otalgia, left ear: Secondary | ICD-10-CM | POA: Diagnosis not present

## 2021-10-25 DIAGNOSIS — J309 Allergic rhinitis, unspecified: Secondary | ICD-10-CM

## 2021-10-25 DIAGNOSIS — E538 Deficiency of other specified B group vitamins: Secondary | ICD-10-CM

## 2021-10-25 LAB — HEPATIC FUNCTION PANEL
ALT: 15 U/L (ref 0–35)
AST: 20 U/L (ref 0–37)
Albumin: 4.2 g/dL (ref 3.5–5.2)
Alkaline Phosphatase: 55 U/L (ref 39–117)
Bilirubin, Direct: 0.1 mg/dL (ref 0.0–0.3)
Total Bilirubin: 0.5 mg/dL (ref 0.2–1.2)
Total Protein: 7.3 g/dL (ref 6.0–8.3)

## 2021-10-25 LAB — BASIC METABOLIC PANEL
BUN: 9 mg/dL (ref 6–23)
CO2: 31 mEq/L (ref 19–32)
Calcium: 9.2 mg/dL (ref 8.4–10.5)
Chloride: 102 mEq/L (ref 96–112)
Creatinine, Ser: 0.78 mg/dL (ref 0.40–1.20)
GFR: 72.75 mL/min (ref >60.00)
Glucose, Bld: 83 mg/dL (ref 70–99)
Potassium: 3.9 mEq/L (ref 3.5–5.1)
Sodium: 138 mEq/L (ref 135–145)

## 2021-10-25 LAB — URINALYSIS, ROUTINE W REFLEX MICROSCOPIC
Bilirubin Urine: NEGATIVE
Hgb urine dipstick: NEGATIVE
Ketones, ur: NEGATIVE
Nitrite: NEGATIVE
Specific Gravity, Urine: 1.01 (ref 1.000–1.030)
Total Protein, Urine: NEGATIVE
Urine Glucose: NEGATIVE
Urobilinogen, UA: 0.2 (ref 0.0–1.0)
pH: 7 (ref 5.0–8.0)

## 2021-10-25 LAB — CBC WITH DIFFERENTIAL/PLATELET
Basophils Absolute: 0 10*3/uL (ref 0.0–0.1)
Basophils Relative: 0.7 % (ref 0.0–3.0)
Eosinophils Absolute: 0.1 10*3/uL (ref 0.0–0.7)
Eosinophils Relative: 1.8 % (ref 0.0–5.0)
HCT: 41.3 % (ref 36.0–46.0)
Hemoglobin: 13.6 g/dL (ref 12.0–15.0)
Lymphocytes Relative: 31.1 % (ref 12.0–46.0)
Lymphs Abs: 1.9 10*3/uL (ref 0.7–4.0)
MCHC: 32.9 g/dL (ref 30.0–36.0)
MCV: 95.7 fl (ref 78.0–100.0)
Monocytes Absolute: 0.7 10*3/uL (ref 0.1–1.0)
Monocytes Relative: 11.5 % (ref 3.0–12.0)
Neutro Abs: 3.4 10*3/uL (ref 1.4–7.7)
Neutrophils Relative %: 54.9 % (ref 43.0–77.0)
Platelets: 263 10*3/uL (ref 150.0–400.0)
RBC: 4.31 Mil/uL (ref 3.87–5.11)
RDW: 12.8 % (ref 11.5–15.5)
WBC: 6.3 10*3/uL (ref 4.0–10.5)

## 2021-10-25 LAB — TSH: TSH: 1.59 u[IU]/mL (ref 0.35–5.50)

## 2021-10-25 LAB — VITAMIN B12: Vitamin B-12: 1161 pg/mL — ABNORMAL HIGH (ref 211–911)

## 2021-10-25 LAB — VITAMIN D 25 HYDROXY (VIT D DEFICIENCY, FRACTURES): VITD: 64.3 ng/mL (ref 30.00–100.00)

## 2021-10-25 LAB — HEMOGLOBIN A1C: Hgb A1c MFr Bld: 5.5 % (ref 4.6–6.5)

## 2021-10-25 MED ORDER — GUAIFENESIN ER 600 MG PO TB12: 1200.0000 mg | ORAL_TABLET | Freq: Two times a day (BID) | ORAL | 1 refills | Status: DC | PRN

## 2021-10-25 MED ORDER — FEXOFENADINE HCL 180 MG PO TABS: 180.0000 mg | ORAL_TABLET | Freq: Every day | ORAL | 2 refills | Status: DC

## 2021-10-25 MED ORDER — ACETAMINOPHEN-CODEINE #3 300-30 MG PO TABS: 1.0000 | ORAL_TABLET | ORAL | 0 refills | Status: DC | PRN

## 2021-10-25 MED ORDER — TRIAMCINOLONE ACETONIDE 55 MCG/ACT NA AERO: 2.0000 | INHALATION_SPRAY | Freq: Every day | NASAL | 12 refills | Status: DC

## 2021-10-25 NOTE — Progress Notes (Signed)
Patient ID: [name] Bell, female   DOB: [birth year], 80 y.o.   MRN: [id number]        Chief Complaint: follow up ear symptoms and HA        HPI:  [name] Bell is a 80 y.o. 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  Due for cataracts out soon bilat over the month of nov 2022.  Also has ongoing fatigue post covid and occasional cough, but no ST, sob, nausea or diarrhea.   Pt denies fever, wt loss, night sweats, loss of appetite, or other constitutional symptoms   Pt denies polydipsia, polyuria, or new focal neuro s/s.     Asks for lab testing today to r/o other cause.   Wt Readings from Last 3 Encounters:  10/25/21 170 lb (77.1 kg)  08/26/21 168 lb 9.6 oz (76.5 kg)  02/11/21 167 lb 3.2 oz (75.8 kg)   BP Readings from Last 3 Encounters:  10/25/21 116/60  08/26/21 118/70  02/11/21 124/70         Past Medical History:  Diagnosis Date   Abnormal involuntary movements(781.0) 01/24/2008   ALLERGIC RHINITIS 01/24/2008   Allergy    ANEMIA-IRON DEFICIENCY 01/24/2008   pt denied   ANXIETY 02/26/2009   BACK PAIN 09/30/2010   Breast cancer ([place])    Cancer of central portion of female breast ([place]) 05/04/2015   right breast/ no chemo or radiation done   DIZZINESS 02/28/2010   ELEVATED BLOOD PRESSURE WITHOUT DIAGNOSIS OF HYPERTENSION 02/26/2009   patient denies   FATIGUE 02/26/2009   GERD 01/24/2008   History of blood transfusion ectopic pregnancy   Neuromuscular disorder ([place])    SHINGLES 11/06/2010   TOE PAIN 09/30/2010   VARICOSE VEINS, LOWER EXTREMITIES 08/16/2008   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   Past Surgical History:  Procedure Laterality Date   AUGMENTATION MAMMAPLASTY Bilateral 10/09/2016   BACK SURGERY  2010   fusion   BREAST  LUMPECTOMY Right 05/14/2015   Procedure: RIGHT BREAST LUMPECTOMY;  Surgeon: Alphonsa Overall, MD;  Location: [facility];  Service: General;  Laterality: Right;   BREAST RECONSTRUCTION WITH PLACEMENT OF TISSUE EXPANDER AND FLEX HD (ACELLULAR HYDRATED DERMIS) Right 10/09/2016   Procedure: IMMEDIATE RIGHT BREAST RECONSTRUCTION WITH PLACEMENT OF SILICONE GEL IMPLANTS OR TISSUE EXPANDER AND ACELLULAR DERMAL MATRIX;  Surgeon: Crissie Reese, MD;  Location: [facility];  Service: Plastics;  Laterality: Right;   ECTOPIC PREGNANCY SURGERY     at 80 yo with salpingectomy   FOOT SURGERY Right    bunion, hammer toe.  x 2   FOOT SURGERY Right 05/2016   3 surgeries on right foot.   INCISION AND DRAINAGE OF WOUND Right 10/09/2016   Procedure: IRRIGATION AND DEBRIDEMENT BREAST HEMATOMA;  Surgeon: Crissie Reese, MD;  Location: [facility];  Service: Plastics;  Laterality: Right;   MASTECTOMY Right 10/09/2016   MASTECTOMY W/ SENTINEL NODE BIOPSY Right 10/09/2016   MASTECTOMY W/ SENTINEL NODE BIOPSY Right 10/09/2016   Procedure: RIGHT MASTECTOMY WITH SENTINEL LYMPH NODE BIOPSY;  Surgeon: Alphonsa Overall, MD;  Location: [facility];  Service: General;  Laterality: Right;   s/p bilateral breasts implants  12/2007   s/p lumbar surgury  july 2010   TONSILLECTOMY      reports that she has never  smoked. She has never used smokeless tobacco. She reports current alcohol use of about 6.0 standard drinks per week. She reports that she does not use drugs. family history includes Asthma in her brother; Lung cancer in her father. Allergies  Allergen Reactions   Advil [Ibuprofen]     Per pt, makes her feel "confused when she takes Advil with meds   Iron     Oxidized iron-makes hand and body break out when touched.   No Known Allergies    Aluminum Rash   Current Outpatient Medications on File Prior to Visit  Medication Sig Dispense Refill   acetaminophen (TYLENOL) 325 MG tablet Take 650 mg by mouth every 6 (six) hours as needed.     Cholecalciferol  (D3 VITAMIN PO)      clonazePAM (KLONOPIN) 1 MG tablet Take 1 tablet (1 mg total) by mouth 2 (two) times daily. 120 tablet 5   Cyanocobalamin (VITAMIN B-12) 1000 MCG SUBL Place under the tongue. Vit b-12 1000 mg with Folate 400 mg-Take one daily     fluticasone (FLONASE) 50 MCG/ACT nasal spray USE 2 SPRAYS INTO BOTH NOSTRILS DAILY 16 g 2   glucosamine-chondroitin 500-400 MG tablet Take 1 tablet by mouth daily.     hydrochlorothiazide (HYDRODIURIL) 25 MG tablet TAKE ONE (1) TABLET BY MOUTH EVERY DAY 90 tablet 0   OVER THE COUNTER MEDICATION Drinks beet juice daily.     OVER THE COUNTER MEDICATION Drinks vegetable juice daily.     RETIN-A 0.05 % cream APPLY TO AFFECTED AREA(S) AT BEDTIME. 45 g 1   traZODone (DESYREL) 50 MG tablet TAKE 1/2 TABLET BY MOUTH IN THE MORNING AND 1 TABLET AT BEDTIME 135 tablet 5   triamcinolone (KENALOG) 0.1 % Apply topically.     TURMERIC PO Take by mouth. Turmeric and Ginger -Take one pill tid     No current facility-administered medications on file prior to visit.        ROS:  All others reviewed and negative.  Objective        PE:  BP 116/60   Pulse 65   Ht 5\' 4"  (1.626 m)   Wt 170 lb (77.1 kg)   SpO2 95%   BMI 29.18 kg/m                 Constitutional: Pt appears in NAD               HENT: Head: NCAT.                Right Ear: External ear normal.                 Left Ear: External ear normal. Left TM with mod erythema, small effusion and bulging               Eyes: . Pupils are equal, round, and reactive to light. Conjunctivae and EOM are normal               Nose: without d/c or deformity               Neck: Neck supple. Gross normal ROM               Cardiovascular: Normal rate and regular rhythm.                 Pulmonary/Chest: Effort normal and breath sounds without rales or wheezing.  Abd:  Soft, NT, ND, + BS, no organomegaly               Neurological: Pt is alert. At baseline orientation, motor grossly intact                Skin: Skin is warm. No rashes, no other new lesions, LE edema - none               Psychiatric: Pt behavior is normal without agitation   Micro: none  Cardiac tracings I have personally interpreted today:  none  Pertinent Radiological findings (summarize): none   Lab Results  Component Value Date   WBC 6.3 10/25/2021   HGB 13.6 10/25/2021   HCT 41.3 10/25/2021   PLT 263.0 10/25/2021   GLUCOSE 83 10/25/2021   CHOL 181 02/11/2021   TRIG 60.0 02/11/2021   HDL 66.70 02/11/2021   LDLDIRECT 128.5 07/04/2013   LDLCALC 102 (H) 02/11/2021   ALT 15 10/25/2021   AST 20 10/25/2021   NA 138 10/25/2021   K 3.9 10/25/2021   CL 102 10/25/2021   CREATININE 0.78 10/25/2021   BUN 9 10/25/2021   CO2 31 10/25/2021   TSH 1.59 10/25/2021   INR 0.9 07/17/2009   HGBA1C 5.5 10/25/2021   Assessment/Plan:  [name] Bell is a 80 y.o. White or Caucasian [1] female with  has a past medical history of Abnormal involuntary movements(781.0) (01/24/2008), ALLERGIC RHINITIS (01/24/2008), Allergy, ANEMIA-IRON DEFICIENCY (01/24/2008), ANXIETY (02/26/2009), BACK PAIN (09/30/2010), Breast cancer ([place]), Cancer of central portion of female breast ([place]) (05/04/2015), DIZZINESS (02/28/2010), ELEVATED BLOOD PRESSURE WITHOUT DIAGNOSIS OF HYPERTENSION (02/26/2009), FATIGUE (02/26/2009), GERD (01/24/2008), History of blood transfusion (ectopic pregnancy), Neuromuscular disorder ([place]), SHINGLES (11/06/2010), TOE PAIN (09/30/2010), VARICOSE VEINS, LOWER EXTREMITIES (08/16/2008), and Vertigo of central origin (02/26/2009).  Hyperlipidemia Lab Results  Component Value Date   LDLCALC 102 (H) 02/11/2021   uncontrolled goal < 100, pt to continue current low chol diet, declines statin for now for f/u lipids   Impaired glucose tolerance Lab Results  Component Value Date   HGBA1C 5.5 10/25/2021   Stable, pt to continue current medical treatment  - diet   Pain, ear For symptom control with tylenol #3 prn limited rx,  to f/u any  worsening symptoms or concerns  Vitamin D deficiency Last vitamin D Lab Results  Component Value Date   VD25OH 64.30 10/25/2021   Stable, cont oral replacement   COVID-19 virus infection Recent, most symtpoms resolved, reasured, no other specific tx needed  Acute dysfunction of left eustachian tube Also for mucinex bid prn  Allergic rhinitis Uncontrolled, mild, Also for add allegra and nasacort to help control mild symptoms and hopefuly inprove the left ear pain as well  Followup: Return if symptoms worsen or fail to improve.  Cathlean Cower, MD 10/27/2021 8:48 AM [facility]

## 2021-10-25 NOTE — Patient Instructions (Addendum)
Please take all new medication as prescribed - the allegra, nasacort, and mucinex as directed  Please take all new medication as prescribed - the tylenol #3 for pain  Please continue all other medications as before, and refills have been done if requested.  Please have the pharmacy call with any other refills you may need.  Please keep your appointments with your specialists as you may have planned  Please go to the LAB at the blood drawing area for the tests to be done  You will be contacted by phone if any changes need to be made immediately.  Otherwise, you will receive a letter about your results with an explanation, but please check with MyChart first.  Please remember to sign up for MyChart if you have not done so, as this will be important to you in the future with finding out test results, communicating by private email, and scheduling acute appointments online when needed.

## 2021-10-27 ENCOUNTER — Encounter: Payer: Self-pay | Admitting: Internal Medicine

## 2021-10-27 DIAGNOSIS — H6992 Unspecified Eustachian tube disorder, left ear: Secondary | ICD-10-CM | POA: Insufficient documentation

## 2021-10-27 DIAGNOSIS — H6982 Other specified disorders of Eustachian tube, left ear: Secondary | ICD-10-CM | POA: Insufficient documentation

## 2021-10-27 NOTE — Assessment & Plan Note (Signed)
Lab Results  Component Value Date   HGBA1C 5.5 10/25/2021   Stable, pt to continue current medical treatment  - diet

## 2021-10-27 NOTE — Assessment & Plan Note (Signed)
For symptom control with tylenol #3 prn limited rx,  to f/u any worsening symptoms or concerns

## 2021-10-27 NOTE — Assessment & Plan Note (Signed)
Also for mucinex bid prn

## 2021-10-27 NOTE — Assessment & Plan Note (Signed)
Last vitamin D Lab Results  Component Value Date   VD25OH 64.30 10/25/2021   Stable, cont oral replacement

## 2021-10-27 NOTE — Assessment & Plan Note (Addendum)
Lab Results  Component Value Date   LDLCALC 102 (H) 02/11/2021   uncontrolled goal < 100, pt to continue current low chol diet, declines statin for now for f/u lipids

## 2021-10-27 NOTE — Assessment & Plan Note (Signed)
Uncontrolled, mild, Also for add allegra and nasacort to help control mild symptoms and hopefuly inprove the left ear pain as well

## 2021-10-27 NOTE — Assessment & Plan Note (Signed)
Recent, most symtpoms resolved, reasured, no other specific tx needed

## 2021-11-01 DIAGNOSIS — L570 Actinic keratosis: Secondary | ICD-10-CM | POA: Diagnosis not present

## 2021-11-10 DIAGNOSIS — H2511 Age-related nuclear cataract, right eye: Secondary | ICD-10-CM | POA: Insufficient documentation

## 2021-11-13 DIAGNOSIS — Z888 Allergy status to other drugs, medicaments and biological substances status: Secondary | ICD-10-CM | POA: Diagnosis not present

## 2021-11-13 DIAGNOSIS — G249 Dystonia, unspecified: Secondary | ICD-10-CM | POA: Diagnosis not present

## 2021-11-13 DIAGNOSIS — Z91048 Other nonmedicinal substance allergy status: Secondary | ICD-10-CM | POA: Diagnosis not present

## 2021-11-13 DIAGNOSIS — Z853 Personal history of malignant neoplasm of breast: Secondary | ICD-10-CM | POA: Diagnosis not present

## 2021-11-13 DIAGNOSIS — H269 Unspecified cataract: Secondary | ICD-10-CM | POA: Diagnosis not present

## 2021-11-13 DIAGNOSIS — Z886 Allergy status to analgesic agent status: Secondary | ICD-10-CM | POA: Diagnosis not present

## 2021-11-13 DIAGNOSIS — Z91040 Latex allergy status: Secondary | ICD-10-CM | POA: Diagnosis not present

## 2021-11-13 DIAGNOSIS — H2511 Age-related nuclear cataract, right eye: Secondary | ICD-10-CM | POA: Diagnosis not present

## 2021-11-13 DIAGNOSIS — M1990 Unspecified osteoarthritis, unspecified site: Secondary | ICD-10-CM | POA: Diagnosis not present

## 2021-11-13 DIAGNOSIS — Z79899 Other long term (current) drug therapy: Secondary | ICD-10-CM | POA: Diagnosis not present

## 2021-11-14 DIAGNOSIS — H2512 Age-related nuclear cataract, left eye: Secondary | ICD-10-CM | POA: Diagnosis not present

## 2021-11-18 ENCOUNTER — Telehealth: Payer: Self-pay | Admitting: Neurology

## 2021-11-18 NOTE — Telephone Encounter (Signed)
Pt called in stating she had cateract surgery on 11/13/21 on her right eye and is having cateract surgery on her left eye on 11/27/21. She has a botox appt on 11/29/21. She wants to find out if she needs to move her botox appt?

## 2021-11-18 NOTE — Telephone Encounter (Signed)
Called patient and let her know she does not need to R/S her Botox appointment

## 2021-11-27 DIAGNOSIS — M1990 Unspecified osteoarthritis, unspecified site: Secondary | ICD-10-CM | POA: Diagnosis not present

## 2021-11-27 DIAGNOSIS — Z9851 Tubal ligation status: Secondary | ICD-10-CM | POA: Diagnosis not present

## 2021-11-27 DIAGNOSIS — H2512 Age-related nuclear cataract, left eye: Secondary | ICD-10-CM | POA: Diagnosis not present

## 2021-11-27 DIAGNOSIS — Z853 Personal history of malignant neoplasm of breast: Secondary | ICD-10-CM | POA: Diagnosis not present

## 2021-11-27 DIAGNOSIS — F419 Anxiety disorder, unspecified: Secondary | ICD-10-CM | POA: Diagnosis not present

## 2021-11-27 DIAGNOSIS — Z91040 Latex allergy status: Secondary | ICD-10-CM | POA: Diagnosis not present

## 2021-11-27 DIAGNOSIS — Z9011 Acquired absence of right breast and nipple: Secondary | ICD-10-CM | POA: Diagnosis not present

## 2021-11-27 DIAGNOSIS — Z888 Allergy status to other drugs, medicaments and biological substances status: Secondary | ICD-10-CM | POA: Diagnosis not present

## 2021-11-27 DIAGNOSIS — G249 Dystonia, unspecified: Secondary | ICD-10-CM | POA: Diagnosis not present

## 2021-11-27 DIAGNOSIS — Z9882 Breast implant status: Secondary | ICD-10-CM | POA: Diagnosis not present

## 2021-11-27 DIAGNOSIS — Z8616 Personal history of COVID-19: Secondary | ICD-10-CM | POA: Diagnosis not present

## 2021-11-27 DIAGNOSIS — I10 Essential (primary) hypertension: Secondary | ICD-10-CM | POA: Diagnosis not present

## 2021-11-27 DIAGNOSIS — Z79899 Other long term (current) drug therapy: Secondary | ICD-10-CM | POA: Diagnosis not present

## 2021-11-29 ENCOUNTER — Ambulatory Visit (INDEPENDENT_AMBULATORY_CARE_PROVIDER_SITE_OTHER): Payer: Medicare Other | Admitting: Neurology

## 2021-11-29 ENCOUNTER — Other Ambulatory Visit: Payer: Self-pay

## 2021-11-29 DIAGNOSIS — G243 Spasmodic torticollis: Secondary | ICD-10-CM | POA: Diagnosis not present

## 2021-11-29 MED ORDER — ONABOTULINUMTOXINA 100 UNITS IJ SOLR
400.0000 [IU] | Freq: Once | INTRAMUSCULAR | Status: AC
  Administered 2021-11-29: 330 [IU] via INTRAMUSCULAR

## 2021-11-29 NOTE — Procedures (Signed)
[facility]   Procedure Note Botox  Attending: Dr. Wells Guiles Sierrah Luevano  Preoperative Diagnosis(es): Cervical Dystonia  Result History  Onset of effect: immediately No SE last time.  Wearing off last few week    Consent obtained from: The patient  Benefits discussed included, but were not limited to decreased muscle tightness, increased joint range of motion, and decreased pain.  Risk discussed included, but were not limited pain and discomfort, bleeding, bruising, excessive weakness, venous thrombosis, muscle atrophy and dysphagia.  A copy of the patient medication guide was given to the patient which explains the blackbox warning.  Informed consent was obtained  Patients identity and treatment sites confirmed yes.     Details of Procedure: Skin was cleaned with alcohol.  A 30 gauge, 1/2 inch needle was introduced to the target muscle (except splenius capitus, posterior approach, where 27 inch, 1 1/2 gauge needle was used).  Prior to injection, the needle plunger was aspirated to make sure the needle was not within a blood vessel.  There was no blood retrieved on aspiration.    Following is a summary of the muscles injected  And the amount of Botulinum toxin used:   Dilution 0.9% preservative free saline mixed with 100 u Botox type A to make 10 U per 0.1cc  Injections  Location Left  Right Units Number of sites        Sternocleidomastoid 60+40 0 100 1  Splenius Capitus, posterior approach 0 100 100 1  Splenius Capitus, lateral approach 0 60 60 1  Levator Scapulae      Trapezius 10/18/19/20  70         TOTAL UNITS:   330    Agent: Botulinum Type A ( Onobotulinum Toxin type A ). 4 vials of Botox were used, containing 100 units and freshly diluted with 1 mL of sterile, non-preserved saline    Total injected (Units): 330  Total wasted (Units): 70   Did well with injections.

## 2021-12-10 ENCOUNTER — Other Ambulatory Visit: Payer: Self-pay | Admitting: Internal Medicine

## 2022-02-04 ENCOUNTER — Telehealth: Payer: Self-pay

## 2022-02-04 NOTE — Telephone Encounter (Signed)
New message   Benefit Verification [id number] Submitted! For BV Basic submissions, please allow 1 business day for results.  For BV Full submissions, please allow 2 business days for results.

## 2022-02-13 DIAGNOSIS — L244 Irritant contact dermatitis due to drugs in contact with skin: Secondary | ICD-10-CM | POA: Diagnosis not present

## 2022-02-25 ENCOUNTER — Other Ambulatory Visit: Payer: Self-pay

## 2022-02-25 ENCOUNTER — Ambulatory Visit (INDEPENDENT_AMBULATORY_CARE_PROVIDER_SITE_OTHER): Payer: Medicare Other | Admitting: Internal Medicine

## 2022-02-25 VITALS — BP 128/68 | HR 56 | Temp 97.7°F | Ht 64.0 in | Wt 169.0 lb

## 2022-02-25 DIAGNOSIS — R7302 Impaired glucose tolerance (oral): Secondary | ICD-10-CM

## 2022-02-25 DIAGNOSIS — G243 Spasmodic torticollis: Secondary | ICD-10-CM | POA: Diagnosis not present

## 2022-02-25 DIAGNOSIS — R5383 Other fatigue: Secondary | ICD-10-CM

## 2022-02-25 DIAGNOSIS — D509 Iron deficiency anemia, unspecified: Secondary | ICD-10-CM

## 2022-02-25 DIAGNOSIS — E538 Deficiency of other specified B group vitamins: Secondary | ICD-10-CM | POA: Diagnosis not present

## 2022-02-25 DIAGNOSIS — E559 Vitamin D deficiency, unspecified: Secondary | ICD-10-CM | POA: Diagnosis not present

## 2022-02-25 DIAGNOSIS — E7849 Other hyperlipidemia: Secondary | ICD-10-CM

## 2022-02-25 LAB — LIPID PANEL
Cholesterol: 203 mg/dL — ABNORMAL HIGH (ref 0–200)
HDL: 75.7 mg/dL (ref >39.00)
LDL Cholesterol: 114 mg/dL — ABNORMAL HIGH (ref 0–99)
NonHDL: 126.92
Total CHOL/HDL Ratio: 3
Triglycerides: 64 mg/dL (ref 0.0–149.0)
VLDL: 12.8 mg/dL (ref 0.0–40.0)

## 2022-02-25 LAB — BASIC METABOLIC PANEL
BUN: 8 mg/dL (ref 6–23)
CO2: 28 mEq/L (ref 19–32)
Calcium: 9.4 mg/dL (ref 8.4–10.5)
Chloride: 102 mEq/L (ref 96–112)
Creatinine, Ser: 0.74 mg/dL (ref 0.40–1.20)
GFR: 77.31 mL/min (ref >60.00)
Glucose, Bld: 85 mg/dL (ref 70–99)
Potassium: 4.4 mEq/L (ref 3.5–5.1)
Sodium: 137 mEq/L (ref 135–145)

## 2022-02-25 LAB — HEPATIC FUNCTION PANEL
ALT: 18 U/L (ref 0–35)
AST: 23 U/L (ref 0–37)
Albumin: 4.3 g/dL (ref 3.5–5.2)
Alkaline Phosphatase: 54 U/L (ref 39–117)
Bilirubin, Direct: 0.1 mg/dL (ref 0.0–0.3)
Total Bilirubin: 0.5 mg/dL (ref 0.2–1.2)
Total Protein: 7.1 g/dL (ref 6.0–8.3)

## 2022-02-25 LAB — URINALYSIS, ROUTINE W REFLEX MICROSCOPIC
Bilirubin Urine: NEGATIVE
Hgb urine dipstick: NEGATIVE
Ketones, ur: NEGATIVE
Leukocytes,Ua: NEGATIVE
Nitrite: NEGATIVE
RBC / HPF: NONE SEEN (ref 0–?)
Specific Gravity, Urine: <=1.005 — AB (ref 1.000–1.030)
Total Protein, Urine: NEGATIVE
Urine Glucose: NEGATIVE
Urobilinogen, UA: 0.2 (ref 0.0–1.0)
pH: 7 (ref 5.0–8.0)

## 2022-02-25 LAB — CBC WITH DIFFERENTIAL/PLATELET
Basophils Absolute: 0 10*3/uL (ref 0.0–0.1)
Basophils Relative: 0.7 % (ref 0.0–3.0)
Eosinophils Absolute: 0.1 10*3/uL (ref 0.0–0.7)
Eosinophils Relative: 1.4 % (ref 0.0–5.0)
HCT: 39.4 % (ref 36.0–46.0)
Hemoglobin: 13 g/dL (ref 12.0–15.0)
Lymphocytes Relative: 32.8 % (ref 12.0–46.0)
Lymphs Abs: 1.9 10*3/uL (ref 0.7–4.0)
MCHC: 32.9 g/dL (ref 30.0–36.0)
MCV: 95.4 fl (ref 78.0–100.0)
Monocytes Absolute: 0.6 10*3/uL (ref 0.1–1.0)
Monocytes Relative: 9.8 % (ref 3.0–12.0)
Neutro Abs: 3.2 10*3/uL (ref 1.4–7.7)
Neutrophils Relative %: 55.3 % (ref 43.0–77.0)
Platelets: 294 10*3/uL (ref 150.0–400.0)
RBC: 4.13 Mil/uL (ref 3.87–5.11)
RDW: 13 % (ref 11.5–15.5)
WBC: 5.7 10*3/uL (ref 4.0–10.5)

## 2022-02-25 LAB — VITAMIN D 25 HYDROXY (VIT D DEFICIENCY, FRACTURES): VITD: 78.1 ng/mL (ref 30.00–100.00)

## 2022-02-25 LAB — HEMOGLOBIN A1C: Hgb A1c MFr Bld: 5.5 % (ref 4.6–6.5)

## 2022-02-25 LAB — TSH: TSH: 1.58 u[IU]/mL (ref 0.35–5.50)

## 2022-02-25 LAB — VITAMIN B12: Vitamin B-12: 1225 pg/mL — ABNORMAL HIGH (ref 211–911)

## 2022-02-25 NOTE — Patient Instructions (Signed)

## 2022-02-25 NOTE — Assessment & Plan Note (Signed)
Last vitamin D Lab Results  Component Value Date   VD25OH 78.10 02/25/2022   Stable, cont oral replacement

## 2022-02-25 NOTE — Assessment & Plan Note (Signed)
For f/u botox soon as planned

## 2022-02-25 NOTE — Assessment & Plan Note (Signed)
Lab Results  Component Value Date   HGBA1C 5.5 02/25/2022   Stable, pt to continue current medical treatment  - diet

## 2022-02-25 NOTE — Assessment & Plan Note (Signed)
Lab Results  Component Value Date   LDLCALC 114 (H) 02/25/2022   uncontrolled, pt to continue current low chol diet, declines statin

## 2022-02-25 NOTE — Progress Notes (Signed)
Patient ID: [name] Bell, female   DOB: 11-23-1941, 81 y.o.   MRN: [id number]         Chief Complaint:: yearly exam       HPI:  [name] Bell is a 81 y.o. female overall doing quite well.  Pt denies chest pain, increased sob or doe, wheezing, orthopnea, PND, increased LE swelling, palpitations, dizziness or syncope.   Pt denies polydipsia, polyuria, or new focal neuro s/s.   Pt denies fever, wt loss, night sweats, loss of appetite, or other constitutional symptoms  Due for repeat every 4 mo botox for torticollis later this week.  No overt bleeding.  Denies worsening depressive symptoms, suicidal ideation, or panic; Trying to follow lower chol diet.  Taking Vit D  Does c/o ongoing fatigue, but denies signficant daytime hypersomnolence.    Wt Readings from Last 3 Encounters:  02/25/22 169 lb (76.7 kg)  10/25/21 170 lb (77.1 kg)  08/26/21 168 lb 9.6 oz (76.5 kg)   BP Readings from Last 3 Encounters:  02/25/22 128/68  10/25/21 116/60  08/26/21 118/70   Immunization History  Administered Date(s) Administered   Fluad Quad(high Dose 65+) 09/03/2019, 08/26/2021   Influenza Split 09/29/2011, 10/21/2012   Influenza Whole 09/25/2009, 09/30/2010   Influenza, High Dose Seasonal PF 11/23/2013, 10/18/2015, 12/02/2016, 11/25/2017, 10/07/2018   Influenza,inj,Quad PF,6+ Mos 09/21/2014   Influenza,inj,quad, With Preservative 09/03/2019   PFIZER(Purple Top)SARS-COV-2 Vaccination 01/19/2020, 02/09/2020, 10/03/2020, 06/11/2021   Pneumococcal Conjugate-13 10/24/2014   Pneumococcal Polysaccharide-23 12/29/2006, 06/22/2018   Td 03/30/2007   Tdap 11/25/2017   Zoster, Live 09/29/2011   There are no preventive care reminders to display for this patient.     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/ no chemo or radiation done   DIZZINESS 02/28/2010   ELEVATED BLOOD PRESSURE WITHOUT DIAGNOSIS OF HYPERTENSION 02/26/2009   patient denies   FATIGUE 02/26/2009   GERD 01/24/2008   History of blood transfusion ectopic pregnancy   Neuromuscular disorder ([place])    SHINGLES 11/06/2010   TOE PAIN 09/30/2010   VARICOSE VEINS, LOWER EXTREMITIES 08/16/2008   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   Past Surgical History:  Procedure Laterality Date   AUGMENTATION MAMMAPLASTY Bilateral 10/09/2016   BACK SURGERY  2010   fusion   BREAST LUMPECTOMY Right 05/14/2015   Procedure: RIGHT BREAST LUMPECTOMY;  Surgeon: Alphonsa Overall, MD;  Location: [place];  Service: General;  Laterality: Right;   BREAST RECONSTRUCTION WITH PLACEMENT OF TISSUE EXPANDER AND FLEX HD (ACELLULAR HYDRATED DERMIS) Right 10/09/2016   Procedure: IMMEDIATE RIGHT BREAST RECONSTRUCTION WITH PLACEMENT OF SILICONE GEL IMPLANTS OR TISSUE EXPANDER AND ACELLULAR DERMAL MATRIX;  Surgeon: Crissie Reese, MD;  Location: [place];  Service: Plastics;  Laterality: Right;   ECTOPIC PREGNANCY SURGERY     at 81 yo with salpingectomy   FOOT SURGERY Right    bunion, hammer toe.  x 2   FOOT SURGERY Right 05/2016   3 surgeries on right foot.   INCISION AND DRAINAGE OF WOUND Right 10/09/2016   Procedure: IRRIGATION AND DEBRIDEMENT BREAST HEMATOMA;  Surgeon: Crissie Reese, MD;  Location: [place];  Service: Plastics;  Laterality: Right;   MASTECTOMY Right 10/09/2016  MASTECTOMY W/ SENTINEL NODE BIOPSY Right 10/09/2016   MASTECTOMY W/ SENTINEL NODE BIOPSY Right 10/09/2016   Procedure: RIGHT MASTECTOMY WITH SENTINEL LYMPH NODE BIOPSY;  Surgeon: Alphonsa Overall, MD;  Location: [place];  Service: General;  Laterality: Right;   s/p bilateral breasts implants  12/2007   s/p lumbar surgury  july 2010   TONSILLECTOMY      reports that she has never smoked. She has never used smokeless tobacco.  She reports current alcohol use of about 6.0 standard drinks per week. She reports that she does not use drugs. family history includes Asthma in her brother; Lung cancer in her father. Allergies  Allergen Reactions   Advil [Ibuprofen]     Per pt, makes her feel "confused when she takes Advil with meds   Iron     Oxidized iron-makes hand and body break out when touched.   No Known Allergies    Aluminum Rash   Current Outpatient Medications on File Prior to Visit  Medication Sig Dispense Refill   acetaminophen (TYLENOL) 325 MG tablet Take 650 mg by mouth every 6 (six) hours as needed.     acetaminophen-codeine (TYLENOL #3) 300-30 MG tablet TAKE 1 TABLET BY MOUTH EVERY 4 HOURS AS NEEDED FOR MODERATE PAIN 30 tablet 0   Cholecalciferol (D3 VITAMIN PO)      clonazePAM (KLONOPIN) 1 MG tablet Take 1 tablet (1 mg total) by mouth 2 (two) times daily. 120 tablet 5   Cyanocobalamin (VITAMIN B-12) 1000 MCG SUBL Place under the tongue. Vit b-12 1000 mg with Folate 400 mg-Take one daily     fexofenadine (ALLEGRA) 180 MG tablet Take 1 tablet (180 mg total) by mouth daily. 30 tablet 2   fluticasone (FLONASE) 50 MCG/ACT nasal spray USE 2 SPRAYS INTO BOTH NOSTRILS DAILY 16 g 2   glucosamine-chondroitin 500-400 MG tablet Take 1 tablet by mouth daily.     guaiFENesin (MUCINEX) 600 MG 12 hr tablet Take 2 tablets (1,200 mg total) by mouth 2 (two) times daily as needed. 60 tablet 1   hydrochlorothiazide (HYDRODIURIL) 25 MG tablet TAKE ONE (1) TABLET BY MOUTH EVERY DAY 90 tablet 0   OVER THE COUNTER MEDICATION Drinks beet juice daily.     OVER THE COUNTER MEDICATION Drinks vegetable juice daily.     RETIN-A 0.05 % cream APPLY TO AFFECTED AREA(S) AT BEDTIME. 45 g 1   traZODone (DESYREL) 50 MG tablet TAKE 1/2 TABLET BY MOUTH IN THE MORNING AND 1 TABLET AT BEDTIME 135 tablet 5   triamcinolone (KENALOG) 0.1 % Apply topically.     triamcinolone (NASACORT) 55 MCG/ACT AERO nasal inhaler Place 2 sprays into the nose  daily. 1 each 12   TURMERIC PO Take by mouth. Turmeric and Ginger -Take one pill tid     No current facility-administered medications on file prior to visit.        ROS:  All others reviewed and negative.  Objective        PE:  BP 128/68    Pulse (!) 56    Temp 97.7 F (36.5 C) (Oral)    Ht 5\' 4"  (1.626 m)    Wt 169 lb (76.7 kg)    SpO2 97%    BMI 29.01 kg/m                 Constitutional: Pt appears in NAD               HENT: Head: NCAT.  Right Ear: External ear normal.                 Left Ear: External ear normal.                Eyes: . Pupils are equal, round, and reactive to light. Conjunctivae and EOM are normal               Nose: without d/c or deformity               Neck: Neck supple. Gross normal ROM               Cardiovascular: Normal rate and regular rhythm.                 Pulmonary/Chest: Effort normal and breath sounds without rales or wheezing.                Abd:  Soft, NT, ND, + BS, no organomegaly               Neurological: Pt is alert. At baseline orientation, motor grossly intact               Skin: Skin is warm. No rashes, no other new lesions, LE edema - none               Psychiatric: Pt behavior is normal without agitation   Micro: none  Cardiac tracings I have personally interpreted today:  none  Pertinent Radiological findings (summarize): none   Lab Results  Component Value Date   WBC 5.7 02/25/2022   HGB 13.0 02/25/2022   HCT 39.4 02/25/2022   PLT 294.0 02/25/2022   GLUCOSE 85 02/25/2022   CHOL 203 (H) 02/25/2022   TRIG 64.0 02/25/2022   HDL 75.70 02/25/2022   LDLDIRECT 128.5 07/04/2013   LDLCALC 114 (H) 02/25/2022   ALT 18 02/25/2022   AST 23 02/25/2022   NA 137 02/25/2022   K 4.4 02/25/2022   CL 102 02/25/2022   CREATININE 0.74 02/25/2022   BUN 8 02/25/2022   CO2 28 02/25/2022   TSH 1.58 02/25/2022   INR 0.9 07/17/2009   HGBA1C 5.5 02/25/2022   Assessment/Plan:  Shanikka Thorlund Welden is a 81 y.o. White or  Caucasian [1] female with  has a past medical history of Abnormal involuntary movements(781.0) (01/24/2008), ALLERGIC RHINITIS (01/24/2008), Allergy, ANEMIA-IRON DEFICIENCY (01/24/2008), ANXIETY (02/26/2009), BACK PAIN (09/30/2010), Breast cancer ([place]), Cancer of central portion of female breast ([place]) (05/04/2015), DIZZINESS (02/28/2010), ELEVATED BLOOD PRESSURE WITHOUT DIAGNOSIS OF HYPERTENSION (02/26/2009), FATIGUE (02/26/2009), GERD (01/24/2008), History of blood transfusion (ectopic pregnancy), Neuromuscular disorder ([place]), SHINGLES (11/06/2010), TOE PAIN (09/30/2010), VARICOSE VEINS, LOWER EXTREMITIES (08/16/2008), and Vertigo of central origin (02/26/2009).  ANEMIA-IRON DEFICIENCY Stable overall, for f/u iron level,  to f/u any worsening symptoms or concerns  Fatigue Etiology unclear, Exam otherwise benign, to check labs as documented, follow with expectant management  Hyperlipidemia Lab Results  Component Value Date   LDLCALC 114 (H) 02/25/2022   uncontrolled, pt to continue current low chol diet, declines statin  Impaired glucose tolerance Lab Results  Component Value Date   HGBA1C 5.5 02/25/2022   Stable, pt to continue current medical treatment  - diet   Vitamin D deficiency Last vitamin D Lab Results  Component Value Date   VD25OH 78.10 02/25/2022   Stable, cont oral replacement  Followup: No follow-ups on file.  Cathlean Cower, MD 02/25/2022 10:13 PM [facility]

## 2022-02-25 NOTE — Assessment & Plan Note (Signed)
Stable overall, for f/u iron level,  to f/u any worsening symptoms or concerns

## 2022-02-25 NOTE — Assessment & Plan Note (Signed)
Etiology unclear, Exam otherwise benign, to check labs as documented, follow with expectant management  

## 2022-02-25 NOTE — Progress Notes (Signed)
Patient ID: [name] Bell, female   DOB: Aug 03, 1941, 81 y.o.   MRN: [id number]         Chief Complaint:: yearly exam       HPI:  [name] Bell is a 81 y.o. female here overall doing ok; Pt denies chest pain, increased sob or doe, wheezing, orthopnea, PND, increased LE swelling, palpitations, dizziness or syncope.   Pt denies polydipsia, polyuria, or new focal neuro s/s.  Has waning botox effect on the torticollis due for reinjection in a few days.  Does c/o ongoing fatigue, but denies signficant daytime hypersomnolence.     Wt Readings from Last 3 Encounters:  02/25/22 169 lb (76.7 kg)  10/25/21 170 lb (77.1 kg)  08/26/21 168 lb 9.6 oz (76.5 kg)   BP Readings from Last 3 Encounters:  02/25/22 128/68  10/25/21 116/60  08/26/21 118/70   Immunization History  Administered Date(s) Administered   Fluad Quad(high Dose 65+) 09/03/2019, 08/26/2021   Influenza Split 09/29/2011, 10/21/2012   Influenza Whole 09/25/2009, 09/30/2010   Influenza, High Dose Seasonal PF 11/23/2013, 10/18/2015, 12/02/2016, 11/25/2017, 10/07/2018   Influenza,inj,Quad PF,6+ Mos 09/21/2014   Influenza,inj,quad, With Preservative 09/03/2019   PFIZER(Purple Top)SARS-COV-2 Vaccination 01/19/2020, 02/09/2020, 10/03/2020, 06/11/2021   Pneumococcal Conjugate-13 10/24/2014   Pneumococcal Polysaccharide-23 12/29/2006, 06/22/2018   Td 03/30/2007   Tdap 11/25/2017   Zoster, Live 09/29/2011   There are no preventive care reminders to display for this patient.     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   right breast/ no chemo or radiation done   DIZZINESS 02/28/2010   ELEVATED BLOOD PRESSURE WITHOUT DIAGNOSIS OF HYPERTENSION 02/26/2009   patient  denies   FATIGUE 02/26/2009   GERD 01/24/2008   History of blood transfusion ectopic pregnancy   Neuromuscular disorder ([place])    SHINGLES 11/06/2010   TOE PAIN 09/30/2010   VARICOSE VEINS, LOWER EXTREMITIES 08/16/2008   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   Past Surgical History:  Procedure Laterality Date   AUGMENTATION MAMMAPLASTY Bilateral 10/09/2016   BACK SURGERY  2010   fusion   BREAST LUMPECTOMY Right 05/14/2015   Procedure: RIGHT BREAST LUMPECTOMY;  Surgeon: Alphonsa Overall, MD;  Location: [place];  Service: General;  Laterality: Right;   BREAST RECONSTRUCTION WITH PLACEMENT OF TISSUE EXPANDER AND FLEX HD (ACELLULAR HYDRATED DERMIS) Right 10/09/2016   Procedure: IMMEDIATE RIGHT BREAST RECONSTRUCTION WITH PLACEMENT OF SILICONE GEL IMPLANTS OR TISSUE EXPANDER AND ACELLULAR DERMAL MATRIX;  Surgeon: Crissie Reese, MD;  Location: [place];  Service: Plastics;  Laterality: Right;   ECTOPIC PREGNANCY SURGERY     at 81 yo with salpingectomy   FOOT SURGERY Right    bunion, hammer toe.  x 2   FOOT SURGERY Right 05/2016   3 surgeries on right foot.   INCISION AND DRAINAGE OF WOUND Right 10/09/2016   Procedure: IRRIGATION AND DEBRIDEMENT BREAST HEMATOMA;  Surgeon: Crissie Reese, MD;  Location: [place];  Service: Plastics;  Laterality: Right;   MASTECTOMY Right 10/09/2016   MASTECTOMY W/ SENTINEL NODE BIOPSY Right 10/09/2016   MASTECTOMY W/ SENTINEL NODE BIOPSY Right 10/09/2016   Procedure: RIGHT MASTECTOMY WITH SENTINEL LYMPH NODE BIOPSY;  Surgeon: Alphonsa Overall, MD;  Location: [facility];  Service:  General;  Laterality: Right;   s/p bilateral breasts implants  12/2007   s/p lumbar surgury  july 2010   TONSILLECTOMY      reports that she has never smoked. She has never used smokeless tobacco. She reports current alcohol use of about 6.0 standard drinks per week. She reports that she does not use drugs. family history includes Asthma in her brother; Lung cancer in her father. Allergies  Allergen Reactions    Advil [Ibuprofen]     Per pt, makes her feel "confused when she takes Advil with meds   Iron     Oxidized iron-makes hand and body break out when touched.   No Known Allergies    Aluminum Rash   Current Outpatient Medications on File Prior to Visit  Medication Sig Dispense Refill   acetaminophen (TYLENOL) 325 MG tablet Take 650 mg by mouth every 6 (six) hours as needed.     acetaminophen-codeine (TYLENOL #3) 300-30 MG tablet TAKE 1 TABLET BY MOUTH EVERY 4 HOURS AS NEEDED FOR MODERATE PAIN 30 tablet 0   Cholecalciferol (D3 VITAMIN PO)      clonazePAM (KLONOPIN) 1 MG tablet Take 1 tablet (1 mg total) by mouth 2 (two) times daily. 120 tablet 5   Cyanocobalamin (VITAMIN B-12) 1000 MCG SUBL Place under the tongue. Vit b-12 1000 mg with Folate 400 mg-Take one daily     fexofenadine (ALLEGRA) 180 MG tablet Take 1 tablet (180 mg total) by mouth daily. 30 tablet 2   fluticasone (FLONASE) 50 MCG/ACT nasal spray USE 2 SPRAYS INTO BOTH NOSTRILS DAILY 16 g 2   glucosamine-chondroitin 500-400 MG tablet Take 1 tablet by mouth daily.     guaiFENesin (MUCINEX) 600 MG 12 hr tablet Take 2 tablets (1,200 mg total) by mouth 2 (two) times daily as needed. 60 tablet 1   hydrochlorothiazide (HYDRODIURIL) 25 MG tablet TAKE ONE (1) TABLET BY MOUTH EVERY DAY 90 tablet 0   OVER THE COUNTER MEDICATION Drinks beet juice daily.     OVER THE COUNTER MEDICATION Drinks vegetable juice daily.     RETIN-A 0.05 % cream APPLY TO AFFECTED AREA(S) AT BEDTIME. 45 g 1   traZODone (DESYREL) 50 MG tablet TAKE 1/2 TABLET BY MOUTH IN THE MORNING AND 1 TABLET AT BEDTIME 135 tablet 5   triamcinolone (KENALOG) 0.1 % Apply topically.     triamcinolone (NASACORT) 55 MCG/ACT AERO nasal inhaler Place 2 sprays into the nose daily. 1 each 12   TURMERIC PO Take by mouth. Turmeric and Ginger -Take one pill tid     No current facility-administered medications on file prior to visit.        ROS:  All others reviewed and negative.  Objective         PE:  BP 128/68    Pulse (!) 56    Temp 97.7 F (36.5 C) (Oral)    Ht 5\' 4"  (1.626 m)    Wt 169 lb (76.7 kg)    SpO2 97%    BMI 29.01 kg/m                 Constitutional: Pt appears in NAD               HENT: Head: NCAT.                Right Ear: External ear normal.                 Left Ear: External ear normal.  Eyes: . Pupils are equal, round, and reactive to light. Conjunctivae and EOM are normal               Nose: without d/c or deformity               Neck: Neck supple. Gross normal ROM               Cardiovascular: Normal rate and regular rhythm.                 Pulmonary/Chest: Effort normal and breath sounds without rales or wheezing.                Abd:  Soft, NT, ND, + BS, no organomegaly               Neurological: Pt is alert. At baseline orientation, motor grossly intact               Skin: Skin is warm. No rashes, no other new lesions, LE edema - none               Psychiatric: Pt behavior is normal without agitation   Micro: none  Cardiac tracings I have personally interpreted today:  none  Pertinent Radiological findings (summarize): none   Lab Results  Component Value Date   WBC 5.7 02/25/2022   HGB 13.0 02/25/2022   HCT 39.4 02/25/2022   PLT 294.0 02/25/2022   GLUCOSE 85 02/25/2022   CHOL 203 (H) 02/25/2022   TRIG 64.0 02/25/2022   HDL 75.70 02/25/2022   LDLDIRECT 128.5 07/04/2013   LDLCALC 114 (H) 02/25/2022   ALT 18 02/25/2022   AST 23 02/25/2022   NA 137 02/25/2022   K 4.4 02/25/2022   CL 102 02/25/2022   CREATININE 0.74 02/25/2022   BUN 8 02/25/2022   CO2 28 02/25/2022   TSH 1.58 02/25/2022   INR 0.9 07/17/2009   HGBA1C 5.5 02/25/2022   Assessment/Plan:  [name] Bell is a 81 y.o. White or Caucasian [1] female with  has a past medical history of Abnormal involuntary movements(781.0) (01/24/2008), ALLERGIC RHINITIS (01/24/2008), Allergy, ANEMIA-IRON DEFICIENCY (01/24/2008), ANXIETY (02/26/2009), BACK PAIN (09/30/2010),  Breast cancer ([place]), Cancer of central portion of female breast ([place]) (05/04/2015), DIZZINESS (02/28/2010), ELEVATED BLOOD PRESSURE WITHOUT DIAGNOSIS OF HYPERTENSION (02/26/2009), FATIGUE (02/26/2009), GERD (01/24/2008), History of blood transfusion (ectopic pregnancy), Neuromuscular disorder ([place]), SHINGLES (11/06/2010), TOE PAIN (09/30/2010), VARICOSE VEINS, LOWER EXTREMITIES (08/16/2008), and Vertigo of central origin (02/26/2009).  ANEMIA-IRON DEFICIENCY Stable overall, for f/u iron level,  to f/u any worsening symptoms or concerns  Fatigue Etiology unclear, Exam otherwise benign, to check labs as documented, follow with expectant management  Hyperlipidemia Lab Results  Component Value Date   LDLCALC 114 (H) 02/25/2022   uncontrolled, pt to continue current low chol diet, declines statin  Impaired glucose tolerance Lab Results  Component Value Date   HGBA1C 5.5 02/25/2022   Stable, pt to continue current medical treatment  - diet   Vitamin D deficiency Last vitamin D Lab Results  Component Value Date   VD25OH 78.10 02/25/2022   Stable, cont oral replacement   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  Cathlean Cower, MD 02/25/2022 10:18 PM [facility]

## 2022-02-28 ENCOUNTER — Other Ambulatory Visit: Payer: Self-pay

## 2022-02-28 ENCOUNTER — Ambulatory Visit (INDEPENDENT_AMBULATORY_CARE_PROVIDER_SITE_OTHER): Payer: Medicare Other | Admitting: Neurology

## 2022-02-28 DIAGNOSIS — G243 Spasmodic torticollis: Secondary | ICD-10-CM | POA: Diagnosis not present

## 2022-02-28 MED ORDER — ONABOTULINUMTOXINA 100 UNITS IJ SOLR
400.0000 [IU] | Freq: Once | INTRAMUSCULAR | Status: AC
  Administered 2022-02-28: 400 [IU] via INTRAMUSCULAR

## 2022-02-28 MED ORDER — TRAZODONE HCL 50 MG PO TABS: ORAL_TABLET | ORAL | 5 refills | Status: DC

## 2022-02-28 NOTE — Procedures (Signed)
[facility]  ? ?Procedure Note Botox ? ?Attending: Dr. Wells Guiles Javon Hupfer ? ?Preoperative Diagnosis(es): Cervical Dystonia ? ?Result History  ?Onset of effect: immediately ?No SE last time.  Wearing off last few week ? ? ? ?Consent obtained from: The patient ? ?Benefits discussed included, but were not limited to decreased muscle tightness, increased joint range of motion, and decreased pain.  Risk discussed included, but were not limited pain and discomfort, bleeding, bruising, excessive weakness, venous thrombosis, muscle atrophy and dysphagia.  A copy of the patient medication guide was given to the patient which explains the blackbox warning.  Informed consent was obtained ? ?Patients identity and treatment sites confirmed yes.   ? ? ?Details of Procedure: ?Skin was cleaned with alcohol.  A 30 gauge, 1/2 inch needle was introduced to the target muscle (except splenius capitus, posterior approach, where 27 inch, 1 1/2 gauge needle was used).  Prior to injection, the needle plunger was aspirated to make sure the needle was not within a blood vessel.  There was no blood retrieved on aspiration.   ? ?Following is a summary of the muscles injected  And the amount of Botulinum toxin used: ? ? ?Dilution ?0.9% preservative free saline mixed with 100 u Botox type A to make 10 U per 0.1cc ? ?Injections  ?Location Left  Right Units Number of sites  ?      ?Sternocleidomastoid 60+40 0 100 1  ?Splenius Capitus, posterior approach 0 100 100 1  ?Splenius Capitus, lateral approach 0 60 60 1  ?Levator Scapulae      ?Trapezius 10/18/19/20  70   ?      ?TOTAL UNITS:   330   ? ?Agent: Botulinum Type A ( Onobotulinum Toxin type A ). 4 vials of Botox were used, containing 100 units and freshly diluted with 1 mL of sterile, non-preserved saline  ? ? Total injected (Units): 330 ? Total wasted (Units): 70 ? ? ?Did well with injections. ?

## 2022-03-10 ENCOUNTER — Other Ambulatory Visit: Payer: Self-pay | Admitting: Internal Medicine

## 2022-04-02 DIAGNOSIS — H5712 Ocular pain, left eye: Secondary | ICD-10-CM | POA: Diagnosis not present

## 2022-04-14 DIAGNOSIS — L570 Actinic keratosis: Secondary | ICD-10-CM | POA: Diagnosis not present

## 2022-04-14 DIAGNOSIS — L858 Other specified epidermal thickening: Secondary | ICD-10-CM | POA: Diagnosis not present

## 2022-04-17 DIAGNOSIS — H353122 Nonexudative age-related macular degeneration, left eye, intermediate dry stage: Secondary | ICD-10-CM | POA: Diagnosis not present

## 2022-04-25 ENCOUNTER — Telehealth: Payer: Self-pay | Admitting: Neurology

## 2022-04-25 ENCOUNTER — Other Ambulatory Visit: Payer: Self-pay | Admitting: Neurology

## 2022-04-25 NOTE — Telephone Encounter (Signed)
Patient needs a refill on her clonazepam  ?[place] ?

## 2022-04-25 NOTE — Telephone Encounter (Signed)
Clonazepam sent to Dr Tat to send in  ?

## 2022-05-30 ENCOUNTER — Ambulatory Visit (INDEPENDENT_AMBULATORY_CARE_PROVIDER_SITE_OTHER): Payer: Medicare Other | Admitting: Neurology

## 2022-05-30 DIAGNOSIS — G243 Spasmodic torticollis: Secondary | ICD-10-CM

## 2022-05-30 MED ORDER — ONABOTULINUMTOXINA 100 UNITS IJ SOLR
400.0000 [IU] | Freq: Once | INTRAMUSCULAR | Status: AC
  Administered 2022-05-30: 330 [IU] via INTRAMUSCULAR

## 2022-05-30 NOTE — Progress Notes (Signed)
[facility]   Procedure Note Botox  Attending: Dr. Wells Guiles Zarai Orsborn  Preoperative Diagnosis(es): Cervical Dystonia  Result History  Onset of effect: immediately No SE last few times.  Wearing off last few week  Consent obtained from: The patient  Benefits discussed included, but were not limited to decreased muscle tightness, increased joint range of motion, and decreased pain.  Risk discussed included, but were not limited pain and discomfort, bleeding, bruising, excessive weakness, venous thrombosis, muscle atrophy and dysphagia.  A copy of the patient medication guide was given to the patient which explains the blackbox warning.  Informed consent was obtained  Patients identity and treatment sites confirmed yes.     Details of Procedure: Skin was cleaned with alcohol.  A 30 gauge, 1/2 inch needle was introduced to the target muscle (except splenius capitus, posterior approach, where 27 inch, 1 1/2 gauge needle was used).  Prior to injection, the needle plunger was aspirated to make sure the needle was not within a blood vessel.  There was no blood retrieved on aspiration.    Following is a summary of the muscles injected  And the amount of Botulinum toxin used:   Dilution 0.9% preservative free saline mixed with 100 u Botox type A to make 10 U per 0.1cc  Injections  Location Left  Right Units Number of sites        Sternocleidomastoid 60+40 0 100 1  Splenius Capitus, posterior approach 0 100 100 1  Splenius Capitus, lateral approach 0 60 60 1  Levator Scapulae      Trapezius 10/18/19/20  70         TOTAL UNITS:   330    Agent: Botulinum Type A ( Onobotulinum Toxin type A ). 4 vials of Botox were used, containing 100 units and freshly diluted with 1 mL of sterile, non-preserved saline    Total injected (Units): 330  Total wasted (Units): 70   Did well with injections.

## 2022-06-09 ENCOUNTER — Telehealth: Payer: Self-pay | Admitting: Internal Medicine

## 2022-06-09 MED ORDER — FLUTICASONE PROPIONATE 50 MCG/ACT NA SUSP: NASAL | 5 refills | Status: DC

## 2022-06-09 NOTE — Telephone Encounter (Signed)
Patient would like a prescription of flonase sent in to the [facility].

## 2022-06-09 NOTE — Telephone Encounter (Signed)
Ok done erx 

## 2022-08-14 ENCOUNTER — Other Ambulatory Visit: Payer: Self-pay | Admitting: Neurology

## 2022-08-18 DIAGNOSIS — Z779 Other contact with and (suspected) exposures hazardous to health: Secondary | ICD-10-CM | POA: Diagnosis not present

## 2022-08-18 DIAGNOSIS — Z6829 Body mass index (BMI) 29.0-29.9, adult: Secondary | ICD-10-CM | POA: Diagnosis not present

## 2022-08-18 DIAGNOSIS — Z853 Personal history of malignant neoplasm of breast: Secondary | ICD-10-CM | POA: Diagnosis not present

## 2022-08-18 DIAGNOSIS — N952 Postmenopausal atrophic vaginitis: Secondary | ICD-10-CM | POA: Diagnosis not present

## 2022-08-20 ENCOUNTER — Other Ambulatory Visit: Payer: Self-pay | Admitting: Neurology

## 2022-08-27 ENCOUNTER — Ambulatory Visit: Payer: Medicare Other | Admitting: Internal Medicine

## 2022-08-29 ENCOUNTER — Ambulatory Visit (INDEPENDENT_AMBULATORY_CARE_PROVIDER_SITE_OTHER): Payer: Medicare Other | Admitting: Neurology

## 2022-08-29 DIAGNOSIS — G243 Spasmodic torticollis: Secondary | ICD-10-CM

## 2022-08-29 MED ORDER — ONABOTULINUMTOXINA 100 UNITS IJ SOLR
400.0000 [IU] | Freq: Once | INTRAMUSCULAR | Status: AC
Start: 2022-08-29 — End: 2022-08-29
  Administered 2022-08-29: 330 [IU] via INTRAMUSCULAR

## 2022-08-29 MED ORDER — ONABOTULINUMTOXINA 100 UNITS IJ SOLR
400.0000 [IU] | Freq: Once | INTRAMUSCULAR | Status: AC
  Administered 2022-08-29: 330 [IU] via INTRAMUSCULAR

## 2022-08-29 NOTE — Procedures (Signed)
[facility]   Procedure Note Botox  Attending: Dr. Wells Guiles Jamesyn [place]  Preoperative Diagnosis(es): Cervical Dystonia  Result History  More swallow trouble last time (pill dysphagia).    Consent obtained from: The patient  Benefits discussed included, but were not limited to decreased muscle tightness, increased joint range of motion, and decreased pain.  Risk discussed included, but were not limited pain and discomfort, bleeding, bruising, excessive weakness, venous thrombosis, muscle atrophy and dysphagia.  A copy of the patient medication guide was given to the patient which explains the blackbox warning.  Informed consent was obtained  Patients identity and treatment sites confirmed yes.     Details of Procedure: Skin was cleaned with alcohol.  A 30 gauge, 1/2 inch needle was introduced to the target muscle (except splenius capitus, posterior approach, where 27 inch, 1 1/2 gauge needle was used).  Prior to injection, the needle plunger was aspirated to make sure the needle was not within a blood vessel.  There was no blood retrieved on aspiration.    Following is a summary of the muscles injected  And the amount of Botulinum toxin used:   Dilution 0.9% preservative free saline mixed with 100 u Botox type A to make 10 U per 0.1cc  Injections  Location Left  Right Units Number of sites        Sternocleidomastoid 60+40 0 100 1  Splenius Capitus, posterior approach 0 100 100 1  Splenius Capitus, lateral approach 0 60 60 1  Levator Scapulae      Trapezius 10/18/19/20  70         TOTAL UNITS:   330    Agent: Botulinum Type A ( Onobotulinum Toxin type A ). 4 vials of Botox were used, containing 100 units and freshly diluted with 1 mL of sterile, non-preserved saline    Total injected (Units): 330  Total wasted (Units): 70   Did well with injections.

## 2022-09-02 ENCOUNTER — Ambulatory Visit: Payer: Medicare Other | Admitting: Internal Medicine

## 2022-09-08 ENCOUNTER — Other Ambulatory Visit: Payer: Self-pay | Admitting: Internal Medicine

## 2022-09-08 DIAGNOSIS — Z1231 Encounter for screening mammogram for malignant neoplasm of breast: Secondary | ICD-10-CM

## 2022-09-15 ENCOUNTER — Ambulatory Visit: Payer: Medicare Other | Admitting: Internal Medicine

## 2022-09-17 ENCOUNTER — Ambulatory Visit (INDEPENDENT_AMBULATORY_CARE_PROVIDER_SITE_OTHER): Payer: Medicare Other | Admitting: Internal Medicine

## 2022-09-17 VITALS — BP 134/72 | HR 66 | Temp 98.2°F | Ht 64.0 in | Wt 163.1 lb

## 2022-09-17 DIAGNOSIS — E7849 Other hyperlipidemia: Secondary | ICD-10-CM

## 2022-09-17 DIAGNOSIS — R030 Elevated blood-pressure reading, without diagnosis of hypertension: Secondary | ICD-10-CM

## 2022-09-17 DIAGNOSIS — R7302 Impaired glucose tolerance (oral): Secondary | ICD-10-CM | POA: Diagnosis not present

## 2022-09-17 DIAGNOSIS — E559 Vitamin D deficiency, unspecified: Secondary | ICD-10-CM

## 2022-09-17 DIAGNOSIS — Z23 Encounter for immunization: Secondary | ICD-10-CM | POA: Diagnosis not present

## 2022-09-17 LAB — CBC WITH DIFFERENTIAL/PLATELET
Basophils Absolute: 0.1 10*3/uL (ref 0.0–0.1)
Basophils Relative: 0.7 % (ref 0.0–3.0)
Eosinophils Absolute: 0.1 10*3/uL (ref 0.0–0.7)
Eosinophils Relative: 1.2 % (ref 0.0–5.0)
HCT: 41.8 % (ref 36.0–46.0)
Hemoglobin: 13.8 g/dL (ref 12.0–15.0)
Lymphocytes Relative: 30.3 % (ref 12.0–46.0)
Lymphs Abs: 2.2 10*3/uL (ref 0.7–4.0)
MCHC: 33 g/dL (ref 30.0–36.0)
MCV: 94.2 fl (ref 78.0–100.0)
Monocytes Absolute: 0.7 10*3/uL (ref 0.1–1.0)
Monocytes Relative: 9.9 % (ref 3.0–12.0)
Neutro Abs: 4.1 10*3/uL (ref 1.4–7.7)
Neutrophils Relative %: 57.9 % (ref 43.0–77.0)
Platelets: 314 10*3/uL (ref 150.0–400.0)
RBC: 4.44 Mil/uL (ref 3.87–5.11)
RDW: 12.7 % (ref 11.5–15.5)
WBC: 7.2 10*3/uL (ref 4.0–10.5)

## 2022-09-17 LAB — BASIC METABOLIC PANEL
BUN: 14 mg/dL (ref 6–23)
CO2: 26 mEq/L (ref 19–32)
Calcium: 9.7 mg/dL (ref 8.4–10.5)
Chloride: 102 mEq/L (ref 96–112)
Creatinine, Ser: 0.99 mg/dL (ref 0.40–1.20)
GFR: 54.3 mL/min — ABNORMAL LOW (ref >60.00)
Glucose, Bld: 92 mg/dL (ref 70–99)
Potassium: 3.8 mEq/L (ref 3.5–5.1)
Sodium: 139 mEq/L (ref 135–145)

## 2022-09-17 LAB — HEPATIC FUNCTION PANEL
ALT: 13 U/L (ref 0–35)
AST: 20 U/L (ref 0–37)
Albumin: 4.2 g/dL (ref 3.5–5.2)
Alkaline Phosphatase: 50 U/L (ref 39–117)
Bilirubin, Direct: 0.1 mg/dL (ref 0.0–0.3)
Total Bilirubin: 0.6 mg/dL (ref 0.2–1.2)
Total Protein: 7.6 g/dL (ref 6.0–8.3)

## 2022-09-17 LAB — VITAMIN D 25 HYDROXY (VIT D DEFICIENCY, FRACTURES): VITD: 80.3 ng/mL (ref 30.00–100.00)

## 2022-09-17 LAB — LIPID PANEL
Cholesterol: 184 mg/dL (ref 0–200)
HDL: 67.4 mg/dL (ref >39.00)
LDL Cholesterol: 102 mg/dL — ABNORMAL HIGH (ref 0–99)
NonHDL: 116.66
Total CHOL/HDL Ratio: 3
Triglycerides: 71 mg/dL (ref 0.0–149.0)
VLDL: 14.2 mg/dL (ref 0.0–40.0)

## 2022-09-17 LAB — TSH: TSH: 1.79 u[IU]/mL (ref 0.35–5.50)

## 2022-09-17 NOTE — Progress Notes (Signed)
Patient ID: [name] Bell, female   DOB: 1941-11-16, 81 y.o.   MRN: [id number]        Chief Complaint: follow up HTN, HLD and hyperglycemia       HPI:  [name] Bell is a 81 y.o. female here with husband, overall doing well, Pt denies chest pain, increased sob or doe, wheezing, orthopnea, PND, increased LE swelling, palpitations, dizziness or syncope.   Pt denies polydipsia, polyuria, or new focal neuro s/s.    Pt denies fever, wt loss, night sweats, loss of appetite, or other constitutional symptoms  Conts to see neurology.  Due for flu shot.  Wt loss 7 lbs in past yr .  Had some mild dysphagia after botox but now resovled, and tends to happen most times transiently.  Trying to follow lower chol diet.    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 breast/ no chemo or radiation done   DIZZINESS 02/28/2010   ELEVATED BLOOD PRESSURE WITHOUT DIAGNOSIS OF HYPERTENSION 02/26/2009   patient denies   FATIGUE 02/26/2009   GERD 01/24/2008   History of blood transfusion ectopic pregnancy   Neuromuscular disorder ([place])    SHINGLES 11/06/2010   TOE PAIN 09/30/2010   VARICOSE VEINS, LOWER EXTREMITIES 08/16/2008   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   Past Surgical History:  Procedure Laterality Date   AUGMENTATION MAMMAPLASTY Bilateral 10/09/2016   BACK SURGERY  2010   fusion   BREAST LUMPECTOMY Right 05/14/2015   Procedure: RIGHT BREAST LUMPECTOMY;  Surgeon: Alphonsa Overall, MD;  Location: [facility];  Service:  General;  Laterality: Right;   BREAST RECONSTRUCTION WITH PLACEMENT OF TISSUE EXPANDER AND FLEX HD (ACELLULAR HYDRATED DERMIS) Right 10/09/2016   Procedure: IMMEDIATE RIGHT BREAST RECONSTRUCTION WITH PLACEMENT OF SILICONE GEL IMPLANTS OR TISSUE EXPANDER AND ACELLULAR DERMAL MATRIX;  Surgeon: Crissie Reese, MD;  Location: [facility];  Service: Plastics;  Laterality: Right;   ECTOPIC PREGNANCY SURGERY     at 81 yo with salpingectomy   FOOT SURGERY Right    bunion, hammer toe.  x 2   FOOT SURGERY Right 05/2016   3 surgeries on right foot.   INCISION AND DRAINAGE OF WOUND Right 10/09/2016   Procedure: IRRIGATION AND DEBRIDEMENT BREAST HEMATOMA;  Surgeon: Crissie Reese, MD;  Location: [facility];  Service: Plastics;  Laterality: Right;   MASTECTOMY Right 10/09/2016   MASTECTOMY W/ SENTINEL NODE BIOPSY Right 10/09/2016   MASTECTOMY W/ SENTINEL NODE BIOPSY Right 10/09/2016   Procedure: RIGHT MASTECTOMY WITH SENTINEL LYMPH NODE BIOPSY;  Surgeon: Alphonsa Overall, MD;  Location: [facility];  Service: General;  Laterality: Right;   s/p bilateral breasts implants  12/2007   s/p lumbar surgury  july 2010   TONSILLECTOMY      reports that she has never smoked. She has never used smokeless tobacco. She reports current alcohol use of about 6.0 standard drinks of alcohol per week. She  reports that she does not use drugs. family history includes Asthma in her brother; Lung cancer in her father. Allergies  Allergen Reactions   Advil [Ibuprofen]     Per pt, makes her feel "confused when she takes Advil with meds   Gentamicin Other (See Comments)    Positive patch test  Positive patch test    Iron     Oxidized iron-makes hand and body break out when touched.   Manganese Nausea And Vomiting    Positive patch test  Positive patch test    Neomycin Nausea And Vomiting    Positive patch test  Positive patch test    Other Other (See Comments)    Ethyl cyanoacrylate: Positive patch test  Methyl methacrylate: Positive patch  test  Disperse dye mix: Positive patch test  Disperse blue 106/124: Positive patch test  Perfume mix: Positive patch test  Ethyl cyanoacrylate: Positive patch test  Methyl methacrylate: Positive patch test  Disperse dye mix: Positive patch test  Disperse blue 106/124: Positive patch test  Perfume mix: Positive patch test    Latex     Added based on information entered during case entry, please review and add reactions, type, and severity as needed   No Known Allergies    Aluminum Rash   Current Outpatient Medications on File Prior to Visit  Medication Sig Dispense Refill   acetaminophen (TYLENOL) 325 MG tablet Take 650 mg by mouth every 6 (six) hours as needed.     acetaminophen-codeine (TYLENOL #3) 300-30 MG tablet TAKE 1 TABLET BY MOUTH EVERY 4 HOURS AS NEEDED FOR MODERATE PAIN 30 tablet 2   Cholecalciferol (D3 VITAMIN PO)      clonazePAM (KLONOPIN) 1 MG tablet TAKE ONE (1) TABLET BY MOUTH TWO (2) TIMES DAILY 60 tablet 0   Cyanocobalamin (VITAMIN B-12) 1000 MCG SUBL Place under the tongue. Vit b-12 1000 mg with Folate 400 mg-Take one daily     fexofenadine (ALLEGRA) 180 MG tablet Take 1 tablet (180 mg total) by mouth daily. 30 tablet 2   glucosamine-chondroitin 500-400 MG tablet Take 1 tablet by mouth daily.     hydrochlorothiazide (HYDRODIURIL) 25 MG tablet TAKE ONE (1) TABLET BY MOUTH EVERY DAY 90 tablet 0   OVER THE COUNTER MEDICATION Drinks beet juice daily.     OVER THE COUNTER MEDICATION Drinks vegetable juice daily.     RETIN-A 0.05 % cream APPLY TO AFFECTED AREA(S) AT BEDTIME. 45 g 1   traZODone (DESYREL) 50 MG tablet TAKE 1/2 TABLET BY MOUTH IN THE MORNING AND 1 TABLET AT BEDTIME 135 tablet 5   triamcinolone (KENALOG) 0.1 % Apply topically.     triamcinolone (NASACORT) 55 MCG/ACT AERO nasal inhaler Place 2 sprays into the nose daily. 1 each 12   TURMERIC PO Take by mouth. Turmeric and Ginger -Take one pill tid     fluticasone (FLONASE) 50 MCG/ACT nasal spray USE 2 SPRAYS  INTO BOTH NOSTRILS DAILY (Patient not taking: Reported on 09/17/2022) 16 g 5   guaiFENesin (MUCINEX) 600 MG 12 hr tablet Take 2 tablets (1,200 mg total) by mouth 2 (two) times daily as needed. (Patient not taking: Reported on 09/17/2022) 60 tablet 1   No current facility-administered medications on file prior to visit.        ROS:  All others reviewed and negative.  Objective        PE:  BP 134/72 (BP Location: Left Arm, Patient Position: Sitting, Cuff Size: Large)   Pulse 66  Temp 98.2 F (36.8 C) (Oral)   Ht '5\' 4"'$  (1.626 m)   Wt 163 lb 2 oz (74 kg)   SpO2 94%   BMI 28.00 kg/m                 Constitutional: Pt appears in NAD               HENT: Head: NCAT.                Right Ear: External ear normal.                 Left Ear: External ear normal.                Eyes: . Pupils are equal, round, and reactive to light. Conjunctivae and EOM are normal               Nose: without d/c or deformity               Neck: Neck supple. Gross normal ROM               Cardiovascular: Normal rate and regular rhythm.                 Pulmonary/Chest: Effort normal and breath sounds without rales or wheezing.                Abd:  Soft, NT, ND, + BS, no organomegaly               Neurological: Pt is alert. At baseline orientation, motor grossly intact               Skin: Skin is warm. No rashes, no other new lesions, LE edema - none               Psychiatric: Pt behavior is normal without agitation   Micro: none  Cardiac tracings I have personally interpreted today:  none  Pertinent Radiological findings (summarize): none   Lab Results  Component Value Date   WBC 7.2 09/17/2022   HGB 13.8 09/17/2022   HCT 41.8 09/17/2022   PLT 314.0 09/17/2022   GLUCOSE 92 09/17/2022   CHOL 184 09/17/2022   TRIG 71.0 09/17/2022   HDL 67.40 09/17/2022   LDLDIRECT 128.5 07/04/2013   LDLCALC 102 (H) 09/17/2022   ALT 13 09/17/2022   AST 20 09/17/2022   NA 139 09/17/2022   K 3.8 09/17/2022   CL 102  09/17/2022   CREATININE 0.99 09/17/2022   BUN 14 09/17/2022   CO2 26 09/17/2022   TSH 1.79 09/17/2022   INR 0.9 07/17/2009   HGBA1C 5.4 09/17/2022   Assessment/Plan:  [name] Bell is a 81 y.o. 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  ELEVATED BLOOD PRESSURE WITHOUT DIAGNOSIS OF HYPERTENSION BP Readings from Last 3 Encounters:  09/17/22 134/72  02/25/22 128/68  10/25/21 116/60   Stable, cont current wt control, low salt diet, excercise  Hyperlipidemia Lab Results  Component Value Date   LDLCALC 102 (H) 09/17/2022   Ucnontrolled, goal  ldl < 70,, pt to start statin crestor 20 mg qd   Impaired glucose tolerance Lab Results  Component Value Date   HGBA1C 5.4 09/17/2022   Stable, pt to continue current medical treatment  - diet, wt control, excercise   Vitamin D deficiency Last vitamin D Lab Results  Component Value Date   VD25OH 80.30 09/17/2022   Stable, cont oral replacement  Followup: Return in about 6 months (around 03/18/2023).  Cathlean Cower, MD 09/19/2022 9:30 PM [facility]

## 2022-09-17 NOTE — Patient Instructions (Addendum)
Your Blood Pressure to be rechecked please - left arm only  Please have your Shingrix (shingles) shots done at your local pharmacy.  You had the flu shot today  Please continue all other medications as before, and refills have been done if requested.  Please have the pharmacy call with any other refills you may need.  Please continue your efforts at being more active, low cholesterol diet, and weight control.  Please keep your appointments with your specialists as you may have planned  Please go to the LAB at the blood drawing area for the tests to be done  You will be contacted by phone if any changes need to be made immediately.  Otherwise, you will receive a letter about your results with an explanation, but please check with MyChart first.  Please remember to sign up for MyChart if you have not done so, as this will be important to you in the future with finding out test results, communicating by private email, and scheduling acute appointments online when needed.  Please make an Appointment to return in 6 months, or sooner if needed

## 2022-09-18 ENCOUNTER — Other Ambulatory Visit: Payer: Self-pay | Admitting: Internal Medicine

## 2022-09-18 LAB — URINALYSIS, ROUTINE W REFLEX MICROSCOPIC
Bilirubin Urine: NEGATIVE
Hgb urine dipstick: NEGATIVE
Leukocytes,Ua: NEGATIVE
Nitrite: NEGATIVE
RBC / HPF: NONE SEEN (ref 0–?)
Specific Gravity, Urine: <=1.005 — AB (ref 1.000–1.030)
Total Protein, Urine: NEGATIVE
Urine Glucose: NEGATIVE
Urobilinogen, UA: 0.2 (ref 0.0–1.0)
pH: 7 (ref 5.0–8.0)

## 2022-09-18 LAB — HEMOGLOBIN A1C: Hgb A1c MFr Bld: 5.4 % (ref 4.6–6.5)

## 2022-09-18 MED ORDER — ROSUVASTATIN CALCIUM 20 MG PO TABS: 20.0000 mg | ORAL_TABLET | Freq: Every day | ORAL | 3 refills | Status: DC

## 2022-09-19 ENCOUNTER — Encounter: Payer: Self-pay | Admitting: Internal Medicine

## 2022-09-19 NOTE — Assessment & Plan Note (Signed)
Last vitamin D Lab Results  Component Value Date   VD25OH 80.30 09/17/2022   Stable, cont oral replacement  

## 2022-09-19 NOTE — Assessment & Plan Note (Signed)
Lab Results  Component Value Date   LDLCALC 102 (H) 09/17/2022   Ucnontrolled, goal ldl < 70,, pt to start statin crestor 20 mg qd

## 2022-09-19 NOTE — Assessment & Plan Note (Signed)
BP Readings from Last 3 Encounters:  09/17/22 134/72  02/25/22 128/68  10/25/21 116/60   Stable, cont current wt control, low salt diet, excercise

## 2022-09-19 NOTE — Assessment & Plan Note (Signed)
Lab Results  Component Value Date   HGBA1C 5.4 09/17/2022   Stable, pt to continue current medical treatment - diet, wt control, excercise  

## 2022-09-22 ENCOUNTER — Other Ambulatory Visit: Payer: Self-pay

## 2022-09-22 ENCOUNTER — Telehealth: Payer: Self-pay | Admitting: Neurology

## 2022-09-22 MED ORDER — CLONAZEPAM 1 MG PO TABS: ORAL_TABLET | ORAL | 4 refills | Status: DC

## 2022-09-22 NOTE — Telephone Encounter (Signed)
Patient needs a refill on the clonazepam 1 mg she only has two left   Patient left Vm about the refill and states that the pharmacy was going to be sending something to [place] as well

## 2022-09-22 NOTE — Telephone Encounter (Signed)
Sent to Dr.Tat for approval

## 2022-09-25 ENCOUNTER — Telehealth: Payer: Self-pay

## 2022-09-25 MED ORDER — ACETAMINOPHEN-CODEINE 300-30 MG PO TABS: 1.0000 | ORAL_TABLET | Freq: Four times a day (QID) | ORAL | 0 refills | Status: DC | PRN

## 2022-09-25 NOTE — Telephone Encounter (Signed)
MEDICATION: acetaminophen-codeine (TYLENOL #3) 300-30 MG tablet   PHARMACY: [place], [place] - [address]  Comments: Pt states [name] has faxed over  a request with no response.   **Let patient know to contact pharmacy at the end of the day to make sure medication is ready. **  ** Please notify patient to allow 48-72 hours to process**  **Encourage patient to contact the pharmacy for refills or they can request refills through [facility]**

## 2022-09-25 NOTE — Telephone Encounter (Signed)
LOV 09/17/22

## 2022-10-07 ENCOUNTER — Ambulatory Visit
Admission: RE | Admit: 2022-10-07 | Discharge: 2022-10-07 | Disposition: A | Discharge status: Home / Self Care | Payer: Medicare Other | Source: Ambulatory Visit | Attending: Internal Medicine | Admitting: Internal Medicine

## 2022-10-07 ENCOUNTER — Other Ambulatory Visit: Payer: Self-pay | Admitting: Internal Medicine

## 2022-10-07 DIAGNOSIS — Z1231 Encounter for screening mammogram for malignant neoplasm of breast: Secondary | ICD-10-CM

## 2022-11-14 ENCOUNTER — Ambulatory Visit (INDEPENDENT_AMBULATORY_CARE_PROVIDER_SITE_OTHER): Payer: Medicare Other | Admitting: Internal Medicine

## 2022-11-14 ENCOUNTER — Encounter: Payer: Self-pay | Admitting: Internal Medicine

## 2022-11-14 ENCOUNTER — Ambulatory Visit: Payer: Medicare Other | Admitting: Internal Medicine

## 2022-11-14 VITALS — BP 128/74 | HR 67 | Temp 97.8°F | Ht 64.0 in | Wt 165.0 lb

## 2022-11-14 DIAGNOSIS — L570 Actinic keratosis: Secondary | ICD-10-CM | POA: Diagnosis not present

## 2022-11-14 DIAGNOSIS — R7302 Impaired glucose tolerance (oral): Secondary | ICD-10-CM | POA: Diagnosis not present

## 2022-11-14 DIAGNOSIS — L821 Other seborrheic keratosis: Secondary | ICD-10-CM | POA: Diagnosis not present

## 2022-11-14 DIAGNOSIS — E559 Vitamin D deficiency, unspecified: Secondary | ICD-10-CM | POA: Diagnosis not present

## 2022-11-14 DIAGNOSIS — R1084 Generalized abdominal pain: Secondary | ICD-10-CM | POA: Diagnosis not present

## 2022-11-14 LAB — URINALYSIS, ROUTINE W REFLEX MICROSCOPIC
Bilirubin Urine: NEGATIVE
Hgb urine dipstick: NEGATIVE
Ketones, ur: NEGATIVE
Nitrite: NEGATIVE
Specific Gravity, Urine: <=1.005 — AB (ref 1.000–1.030)
Total Protein, Urine: NEGATIVE
Urine Glucose: NEGATIVE
Urobilinogen, UA: 0.2 (ref 0.0–1.0)
pH: 6 (ref 5.0–8.0)

## 2022-11-14 LAB — BASIC METABOLIC PANEL
BUN: 12 mg/dL (ref 6–23)
CO2: 31 mEq/L (ref 19–32)
Calcium: 9.2 mg/dL (ref 8.4–10.5)
Chloride: 103 mEq/L (ref 96–112)
Creatinine, Ser: 0.77 mg/dL (ref 0.40–1.20)
GFR: 73.34 mL/min (ref >60.00)
Glucose, Bld: 88 mg/dL (ref 70–99)
Potassium: 3.8 mEq/L (ref 3.5–5.1)
Sodium: 139 mEq/L (ref 135–145)

## 2022-11-14 LAB — CBC WITH DIFFERENTIAL/PLATELET
Basophils Absolute: 0.1 10*3/uL (ref 0.0–0.1)
Basophils Relative: 0.8 % (ref 0.0–3.0)
Eosinophils Absolute: 0.1 10*3/uL (ref 0.0–0.7)
Eosinophils Relative: 1 % (ref 0.0–5.0)
HCT: 42.2 % (ref 36.0–46.0)
Hemoglobin: 14 g/dL (ref 12.0–15.0)
Lymphocytes Relative: 25.2 % (ref 12.0–46.0)
Lymphs Abs: 1.8 10*3/uL (ref 0.7–4.0)
MCHC: 33.1 g/dL (ref 30.0–36.0)
MCV: 94.8 fl (ref 78.0–100.0)
Monocytes Absolute: 0.8 10*3/uL (ref 0.1–1.0)
Monocytes Relative: 11.9 % (ref 3.0–12.0)
Neutro Abs: 4.3 10*3/uL (ref 1.4–7.7)
Neutrophils Relative %: 61.1 % (ref 43.0–77.0)
Platelets: 345 10*3/uL (ref 150.0–400.0)
RBC: 4.45 Mil/uL (ref 3.87–5.11)
RDW: 13.3 % (ref 11.5–15.5)
WBC: 7.1 10*3/uL (ref 4.0–10.5)

## 2022-11-14 LAB — HEPATIC FUNCTION PANEL
ALT: 14 U/L (ref 0–35)
AST: 20 U/L (ref 0–37)
Albumin: 4.4 g/dL (ref 3.5–5.2)
Alkaline Phosphatase: 60 U/L (ref 39–117)
Bilirubin, Direct: 0.1 mg/dL (ref 0.0–0.3)
Total Bilirubin: 0.4 mg/dL (ref 0.2–1.2)
Total Protein: 7.4 g/dL (ref 6.0–8.3)

## 2022-11-14 LAB — H. PYLORI ANTIBODY, IGG: H Pylori IgG: NEGATIVE

## 2022-11-14 LAB — LIPASE: Lipase: 34 U/L (ref 11.0–59.0)

## 2022-11-14 MED ORDER — SUCRALFATE 1 G PO TABS: 1.0000 g | ORAL_TABLET | Freq: Four times a day (QID) | ORAL | 0 refills | Status: DC

## 2022-11-14 MED ORDER — PANTOPRAZOLE SODIUM 40 MG PO TBEC: 40.0000 mg | DELAYED_RELEASE_TABLET | Freq: Every day | ORAL | 5 refills | Status: AC

## 2022-11-14 NOTE — Progress Notes (Signed)
Patient ID: [name] Bell, female   DOB: Dec 22, 1941, 81 y.o.   MRN: [id number]        Chief Complaint: follow up mid and upper abd pain x 2-3 mo       HPI:  [name] Bell is a 81 y.o. female here with c/o mid and upper abd pain, dull, pressure but occasionally sharp, intermittent, overall now mod to severe, assoc with nausea and belching, and decreased appetite, but no wt loss, radiation, constipation and Denies worsening reflux, dysphagia, vomiting, other bowel change or blood.   Drinks ETOH occasionally, not more recently.  No Nsiad use - has tyelnol #3 prn pain.   Gaviscon helps for a short while when pain is bad.  Pt denies chest pain, increased sob or doe, wheezing, orthopnea, PND, increased LE swelling, palpitations, dizziness or syncope.   Pt denies polydipsia, polyuria, or new focal neuro s/s.   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   right breast/ no chemo or radiation done   DIZZINESS 02/28/2010   ELEVATED BLOOD PRESSURE WITHOUT DIAGNOSIS OF HYPERTENSION 02/26/2009   patient denies   FATIGUE 02/26/2009   GERD 01/24/2008   History of blood transfusion ectopic pregnancy   Neuromuscular disorder ([place])    SHINGLES 11/06/2010   TOE PAIN 09/30/2010   VARICOSE VEINS, LOWER EXTREMITIES 08/16/2008   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   Past Surgical History:  Procedure Laterality Date   AUGMENTATION MAMMAPLASTY Bilateral 10/09/2016   BACK SURGERY  2010   fusion   BREAST LUMPECTOMY Right  05/14/2015   Procedure: RIGHT BREAST LUMPECTOMY;  Surgeon: Alphonsa Overall, MD;  Location: [facility];  Service: General;  Laterality: Right;   BREAST RECONSTRUCTION WITH PLACEMENT OF TISSUE EXPANDER AND FLEX HD (ACELLULAR HYDRATED DERMIS) Right 10/09/2016   Procedure: IMMEDIATE RIGHT BREAST RECONSTRUCTION WITH PLACEMENT OF SILICONE GEL IMPLANTS OR TISSUE EXPANDER AND ACELLULAR DERMAL MATRIX;  Surgeon: Crissie Reese, MD;  Location: [facility];  Service: Plastics;  Laterality: Right;   ECTOPIC PREGNANCY SURGERY     at 81 yo with salpingectomy   FOOT SURGERY Right    bunion, hammer toe.  x 2   FOOT SURGERY Right 05/2016   3 surgeries on right foot.   INCISION AND DRAINAGE OF WOUND Right 10/09/2016   Procedure: IRRIGATION AND DEBRIDEMENT BREAST HEMATOMA;  Surgeon: Crissie Reese, MD;  Location: [facility];  Service: Plastics;  Laterality: Right;   MASTECTOMY Right 10/09/2016   MASTECTOMY W/ SENTINEL NODE BIOPSY Right 10/09/2016   MASTECTOMY W/ SENTINEL NODE BIOPSY Right 10/09/2016   Procedure: RIGHT MASTECTOMY WITH SENTINEL LYMPH NODE BIOPSY;  Surgeon: Alphonsa Overall, MD;  Location: [facility];  Service: General;  Laterality: Right;   s/p bilateral breasts implants  12/2007   s/p lumbar surgury  july 2010   TONSILLECTOMY      reports that she has never smoked. She has never used smokeless tobacco. She reports current  alcohol use of about 6.0 standard drinks of alcohol per week. She reports that she does not use drugs. family history includes Asthma in her brother; Lung cancer in her father. Allergies  Allergen Reactions   Advil [Ibuprofen]     Per pt, makes her feel "confused when she takes Advil with meds   Gentamicin Other (See Comments)    Positive patch test  Positive patch test    Iron     Oxidized iron-makes hand and body break out when touched.   Manganese Nausea And Vomiting    Positive patch test  Positive patch test    Neomycin Nausea And Vomiting    Positive patch test  Positive patch test    Other  Other (See Comments)    Ethyl cyanoacrylate: Positive patch test  Methyl methacrylate: Positive patch test  Disperse dye mix: Positive patch test  Disperse blue 106/124: Positive patch test  Perfume mix: Positive patch test  Ethyl cyanoacrylate: Positive patch test  Methyl methacrylate: Positive patch test  Disperse dye mix: Positive patch test  Disperse blue 106/124: Positive patch test  Perfume mix: Positive patch test    Latex     Added based on information entered during case entry, please review and add reactions, type, and severity as needed   No Known Allergies    Aluminum Rash   Current Outpatient Medications on File Prior to Visit  Medication Sig Dispense Refill   acetaminophen (TYLENOL) 325 MG tablet Take 650 mg by mouth every 6 (six) hours as needed.     acetaminophen-codeine (TYLENOL/CODEINE #3) 300-30 MG tablet Take 1 tablet by mouth every 6 (six) hours as needed. 30 tablet 0   Cholecalciferol (D3 VITAMIN PO)      clonazePAM (KLONOPIN) 1 MG tablet TAKE ONE (1) TABLET BY MOUTH TWO (2) TIMES DAILY 60 tablet 4   Cyanocobalamin (VITAMIN B-12) 1000 MCG SUBL Place under the tongue. Vit b-12 1000 mg with Folate 400 mg-Take one daily     fluticasone (FLONASE) 50 MCG/ACT nasal spray USE 2 SPRAYS INTO BOTH NOSTRILS DAILY 16 g 5   gatifloxacin (ZYMAXID) 0.5 % SOLN Place one drop into the right eye 4 (four) times daily.     glucosamine-chondroitin 500-400 MG tablet Take 1 tablet by mouth daily.     guaiFENesin (MUCINEX) 600 MG 12 hr tablet Take 2 tablets (1,200 mg total) by mouth 2 (two) times daily as needed. 60 tablet 1   hydrochlorothiazide (HYDRODIURIL) 25 MG tablet TAKE ONE (1) TABLET BY MOUTH EVERY DAY 90 tablet 0   OVER THE COUNTER MEDICATION Drinks beet juice daily.     OVER THE COUNTER MEDICATION Drinks vegetable juice daily.     RETIN-A 0.05 % cream APPLY TO AFFECTED AREA(S) AT BEDTIME. 45 g 1   rosuvastatin (CRESTOR) 20 MG tablet Take 1 tablet (20 mg total) by mouth daily.  90 tablet 3   traZODone (DESYREL) 50 MG tablet TAKE 1/2 TABLET BY MOUTH IN THE MORNING AND 1 TABLET AT BEDTIME 135 tablet 5   triamcinolone (KENALOG) 0.1 % Apply topically.     triamcinolone (NASACORT) 55 MCG/ACT AERO nasal inhaler Place 2 sprays into the nose daily. 1 each 12   TURMERIC PO Take by mouth. Turmeric and Ginger -Take one pill tid     fexofenadine (ALLEGRA) 180 MG tablet Take 1 tablet (180 mg total) by mouth daily. 30 tablet 2   No current facility-administered medications on file prior to visit.  ROS:  All others reviewed and negative.  Objective        PE:  BP 128/74 (BP Location: Left Arm, Patient Position: Sitting, Cuff Size: Large)   Pulse 67   Temp 97.8 F (36.6 C) (Oral)   Ht '5\' 4"'$  (1.626 m)   Wt 165 lb (74.8 kg)   SpO2 97%   BMI 28.32 kg/m                 Constitutional: Pt appears in NAD               HENT: Head: NCAT.                Right Ear: External ear normal.                 Left Ear: External ear normal.                Eyes: . Pupils are equal, round, and reactive to light. Conjunctivae and EOM are normal               Nose: without d/c or deformity               Neck: Neck supple. Gross normal ROM               Cardiovascular: Normal rate and regular rhythm.                 Pulmonary/Chest: Effort normal and breath sounds without rales or wheezing.                Abd:  Soft, ND, + BS, no organomegaly, has mid and epigastric tenderness, less to LUQ and RUQ               Neurological: Pt is alert. At baseline orientation, motor grossly intact               Skin: Skin is warm. No rashes, no other new lesions, LE edema - none               Psychiatric: Pt behavior is normal without agitation   Micro: none  Cardiac tracings I have personally interpreted today:  none  Pertinent Radiological findings (summarize): none   Lab Results  Component Value Date   WBC 7.2 09/17/2022   HGB 13.8 09/17/2022   HCT 41.8 09/17/2022   PLT 314.0 09/17/2022    GLUCOSE 92 09/17/2022   CHOL 184 09/17/2022   TRIG 71.0 09/17/2022   HDL 67.40 09/17/2022   LDLDIRECT 128.5 07/04/2013   LDLCALC 102 (H) 09/17/2022   ALT 13 09/17/2022   AST 20 09/17/2022   NA 139 09/17/2022   K 3.8 09/17/2022   CL 102 09/17/2022   CREATININE 0.99 09/17/2022   BUN 14 09/17/2022   CO2 26 09/17/2022   TSH 1.79 09/17/2022   INR 0.9 07/17/2009   HGBA1C 5.4 09/17/2022   Assessment/Plan:  Yetunde Thorlund Sayed is a 81 y.o. 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  Abdominal pain I suspect gastritis, can't r/o H  pylori, now for labs including cbc, lipase, H pylori IgG, empiric protonix 40 qd and carafate, and refer GI though can be cancelled if symptoms easily resolved in the next wk with treatement; also consider CT if not improved  Impaired glucose tolerance Lab Results  Component Value Date   HGBA1C 5.4 09/17/2022   Stable, pt to continue current medical treatment - diet, wt control, excercise   Vitamin D deficiency Last vitamin D Lab Results  Component Value Date   VD25OH 80.30 09/17/2022   Stable, cont oral replacement  Followup: Return if symptoms worsen or fail to improve.  Cathlean Cower, MD 11/14/2022 2:57 PM [facility]

## 2022-11-14 NOTE — Assessment & Plan Note (Addendum)
I suspect gastritis, can't r/o H pylori, now for labs including cbc, lipase, H pylori IgG, empiric protonix 40 qd and carafate, and refer GI though can be cancelled if symptoms easily resolved in the next wk with treatement; also consider CT if not improved

## 2022-11-14 NOTE — Assessment & Plan Note (Signed)
Lab Results  Component Value Date   HGBA1C 5.4 09/17/2022   Stable, pt to continue current medical treatment - diet, wt control, excercise

## 2022-11-14 NOTE — Assessment & Plan Note (Signed)
Last vitamin D Lab Results  Component Value Date   VD25OH 80.30 09/17/2022   Stable, cont oral replacement

## 2022-11-14 NOTE — Patient Instructions (Addendum)
Please take all new medication as prescribed - the protonix and carafate for the stomach  Please call in 1 week if these dont seem to help, as we could consider CT scan  Please continue all other medications as before, including the tylenol #3 as needed  Please have the pharmacy call with any other refills you may need.  Please keep your appointments with your specialists as you may have planned  You will be contacted regarding the referral for: Gastroenterology  Please go to the LAB at the blood drawing area for the tests to be done  You will be contacted by phone if any changes need to be made immediately.  Otherwise, you will receive a letter about your results with an explanation, but please check with MyChart first.  Please remember to sign up for MyChart if you have not done so, as this will be important to you in the future with finding out test results, communicating by private email, and scheduling acute appointments online when needed.

## 2022-12-04 ENCOUNTER — Telehealth: Payer: Self-pay | Admitting: Internal Medicine

## 2022-12-04 ENCOUNTER — Other Ambulatory Visit: Payer: Self-pay

## 2022-12-04 MED ORDER — SUCRALFATE 1 G PO TABS: 1.0000 g | ORAL_TABLET | Freq: Four times a day (QID) | ORAL | 0 refills | Status: DC

## 2022-12-04 NOTE — Telephone Encounter (Signed)
Patient requested refill for Sucralfate and Pantoprazole, pharmacy dispensed 60 (sucralfate) and did not have the remaining in stock. The rest of the Rx (60 tabs), was sent to [facility] in [place] per patient request. Refill for Pantoprazole refused until 12/11 when covered by insurance and patient is aware. Follow up scheduled for 12/19 for abdominal pain and possible referral to gastro.

## 2022-12-04 NOTE — Telephone Encounter (Signed)
Patient called and said that the 2 meds sent in at the last appointment worked perfectly but that she is running out and didn't know if she can get a refill on sucralfate (CARAFATE) 1 g tablet.   She also said that Dr Jenny Reichmann said that if the medication was working then she might not need gastro referral, She wanted to if she still does or not. Please advise. Call back is [phone]

## 2022-12-04 NOTE — Telephone Encounter (Signed)
The plan was to try the 2 meds for one month, then try to stop each.  Ok to restart the protonix if needed, and we could likely address the need for GI referral at her next visit, or sooner if she feels the need.   Referrals can take up to several months due to high demand

## 2022-12-05 ENCOUNTER — Ambulatory Visit (INDEPENDENT_AMBULATORY_CARE_PROVIDER_SITE_OTHER): Payer: Medicare Other | Admitting: Neurology

## 2022-12-05 DIAGNOSIS — G243 Spasmodic torticollis: Secondary | ICD-10-CM

## 2022-12-05 MED ORDER — ONABOTULINUMTOXINA 100 UNITS IJ SOLR
400.0000 [IU] | Freq: Once | INTRAMUSCULAR | Status: AC
  Administered 2022-12-05: 330 [IU] via INTRAMUSCULAR

## 2022-12-05 NOTE — Procedures (Signed)
[facility]   Procedure Note Botox  Attending: Dr. Wells Guiles Derick Seminara  Preoperative Diagnosis(es): Cervical Dystonia  Result History  More trouble turning neck due to dystonia  Consent obtained from: The patient  Benefits discussed included, but were not limited to decreased muscle tightness, increased joint range of motion, and decreased pain.  Risk discussed included, but were not limited pain and discomfort, bleeding, bruising, excessive weakness, venous thrombosis, muscle atrophy and dysphagia.  A copy of the patient medication guide was given to the patient which explains the blackbox warning.  Informed consent was obtained  Patients identity and treatment sites confirmed yes.     Details of Procedure: Skin was cleaned with alcohol.  A 30 gauge, 1/2 inch needle was introduced to the target muscle (except splenius capitus, posterior approach, where 27 inch, 1 1/2 gauge needle was used).  Prior to injection, the needle plunger was aspirated to make sure the needle was not within a blood vessel.  There was no blood retrieved on aspiration.    Following is a summary of the muscles injected  And the amount of Botulinum toxin used:   Dilution 0.9% preservative free saline mixed with 100 u Botox type A to make 10 U per 0.1cc  Injections  Location Left  Right Units Number of sites        Sternocleidomastoid 60+40 0 100 1  Splenius Capitus, posterior approach 0 100 100 1  Splenius Capitus, lateral approach 0 60 60 1  Levator Scapulae      Trapezius 10/18/19/20  70         TOTAL UNITS:   330    Agent: Botulinum Type A ( Onobotulinum Toxin type A ). 4 vials of Botox were used, containing 100 units and freshly diluted with 1 mL of sterile, non-preserved saline    Total injected (Units): 330  Total wasted (Units): 70   Did well with injections.

## 2022-12-16 ENCOUNTER — Ambulatory Visit (INDEPENDENT_AMBULATORY_CARE_PROVIDER_SITE_OTHER): Payer: Medicare Other | Admitting: Internal Medicine

## 2022-12-16 ENCOUNTER — Encounter: Payer: Self-pay | Admitting: Internal Medicine

## 2022-12-16 VITALS — BP 134/82 | HR 73 | Temp 98.3°F | Ht 64.0 in | Wt 167.0 lb

## 2022-12-16 DIAGNOSIS — L03115 Cellulitis of right lower limb: Secondary | ICD-10-CM | POA: Diagnosis not present

## 2022-12-16 DIAGNOSIS — E559 Vitamin D deficiency, unspecified: Secondary | ICD-10-CM | POA: Diagnosis not present

## 2022-12-16 DIAGNOSIS — U071 COVID-19: Secondary | ICD-10-CM | POA: Diagnosis not present

## 2022-12-16 DIAGNOSIS — R1084 Generalized abdominal pain: Secondary | ICD-10-CM | POA: Diagnosis not present

## 2022-12-16 MED ORDER — HYDROCODONE BIT-HOMATROP MBR 5-1.5 MG/5ML PO SOLN: 5.0000 mL | Freq: Four times a day (QID) | ORAL | 0 refills | Status: AC | PRN

## 2022-12-16 MED ORDER — AZITHROMYCIN 250 MG PO TABS: ORAL_TABLET | ORAL | 1 refills | Status: AC

## 2022-12-16 MED ORDER — ACETAMINOPHEN-CODEINE 300-30 MG PO TABS: 1.0000 | ORAL_TABLET | Freq: Four times a day (QID) | ORAL | 0 refills | Status: DC | PRN

## 2022-12-16 MED ORDER — FLUTICASONE PROPIONATE 50 MCG/ACT NA SUSP: NASAL | 5 refills | Status: AC

## 2022-12-16 MED ORDER — NIRMATRELVIR/RITONAVIR (PAXLOVID)TABLET: 3.0000 | ORAL_TABLET | Freq: Two times a day (BID) | ORAL | 0 refills | Status: AC

## 2022-12-16 NOTE — Progress Notes (Signed)
Patient ID: [name] Bell, female   DOB: 1941/11/20, 81 y.o.   MRN: [id number]        Chief Complaint: follow up new covid infection, right leg celluitis, abd pain, low vit d       HPI:  [name] Bell is a 81 y.o. female here with c/o new onset covid symptoms yesterday and tested Home + after several family members ill recently as well.  Has cough, fatigue, feels bad, head congestion, HA.  No SOB, n/v/d.  This is second episode.   Has had at least 2 original vax and 1 booster.   Pt denies chest pain, increased sob or doe, wheezing, orthopnea, PND, increased LE swelling, palpitations, dizziness or syncope.   Pt denies polydipsia, polyuria, or new focal neuro s/s.   Denies worsening reflux, abd pain, dysphagia, n/v, bowel change or blood - treatment as of last visit helped greatly.  Unfortunately also here after an accident trauma of metal that lacerated gouged the distal right lower leg above the ankle, bled quit a bit and stopped, but in last 2 days now with worsening redness, swelling, soreness but no drainage.  No high fever, chills, or red streaks.         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 breast/ no chemo or radiation done   DIZZINESS 02/28/2010   ELEVATED BLOOD PRESSURE WITHOUT DIAGNOSIS OF HYPERTENSION 02/26/2009   patient denies   FATIGUE 02/26/2009   GERD 01/24/2008   History of blood transfusion ectopic pregnancy   Neuromuscular disorder ([place])    SHINGLES 11/06/2010   TOE PAIN  09/30/2010   VARICOSE VEINS, LOWER EXTREMITIES 08/16/2008   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   Past Surgical History:  Procedure Laterality Date   AUGMENTATION MAMMAPLASTY Bilateral 10/09/2016   BACK SURGERY  2010   fusion   BREAST LUMPECTOMY Right 05/14/2015   Procedure: RIGHT BREAST LUMPECTOMY;  Surgeon: Alphonsa Overall, MD;  Location: [facility];  Service: General;  Laterality: Right;   BREAST RECONSTRUCTION WITH PLACEMENT OF TISSUE EXPANDER AND FLEX HD (ACELLULAR HYDRATED DERMIS) Right 10/09/2016   Procedure: IMMEDIATE RIGHT BREAST RECONSTRUCTION WITH PLACEMENT OF SILICONE GEL IMPLANTS OR TISSUE EXPANDER AND ACELLULAR DERMAL MATRIX;  Surgeon: Crissie Reese, MD;  Location: [facility];  Service: Plastics;  Laterality: Right;   ECTOPIC PREGNANCY SURGERY     at 81 yo with salpingectomy   FOOT SURGERY Right    bunion, hammer toe.  x 2   FOOT SURGERY Right 05/2016   3 surgeries on right foot.   INCISION AND DRAINAGE OF WOUND Right 10/09/2016   Procedure: IRRIGATION AND DEBRIDEMENT BREAST HEMATOMA;  Surgeon: Crissie Reese, MD;  Location: [facility];  Service: Plastics;  Laterality: Right;   MASTECTOMY Right 10/09/2016   MASTECTOMY W/ SENTINEL NODE BIOPSY Right 10/09/2016   MASTECTOMY W/ SENTINEL NODE BIOPSY Right 10/09/2016   Procedure: RIGHT MASTECTOMY WITH SENTINEL LYMPH  NODE BIOPSY;  Surgeon: Alphonsa Overall, MD;  Location: [facility];  Service: General;  Laterality: Right;   s/p bilateral breasts implants  12/2007   s/p lumbar surgury  july 2010   TONSILLECTOMY      reports that she has never smoked. She has never used smokeless tobacco. She reports current alcohol use of about 6.0 standard drinks of alcohol per week. She reports that she does not use drugs. family history includes Asthma in her brother; Lung cancer in her father. Allergies  Allergen Reactions   Advil [Ibuprofen]     Per pt, makes her feel "confused when she takes Advil with meds   Gentamicin Other (See Comments)    Positive patch test   Positive patch test    Iron     Oxidized iron-makes hand and body break out when touched.   Manganese Nausea And Vomiting    Positive patch test  Positive patch test    Neomycin Nausea And Vomiting    Positive patch test  Positive patch test    Other Other (See Comments)    Ethyl cyanoacrylate: Positive patch test  Methyl methacrylate: Positive patch test  Disperse dye mix: Positive patch test  Disperse blue 106/124: Positive patch test  Perfume mix: Positive patch test  Ethyl cyanoacrylate: Positive patch test  Methyl methacrylate: Positive patch test  Disperse dye mix: Positive patch test  Disperse blue 106/124: Positive patch test  Perfume mix: Positive patch test    Latex     Added based on information entered during case entry, please review and add reactions, type, and severity as needed   No Known Allergies    Aluminum Rash   Current Outpatient Medications on File Prior to Visit  Medication Sig Dispense Refill   acetaminophen (TYLENOL) 325 MG tablet Take 650 mg by mouth every 6 (six) hours as needed.     Cholecalciferol (D3 VITAMIN PO)      clonazePAM (KLONOPIN) 1 MG tablet TAKE ONE (1) TABLET BY MOUTH TWO (2) TIMES DAILY 60 tablet 4   Cyanocobalamin (VITAMIN B-12) 1000 MCG SUBL Place under the tongue. Vit b-12 1000 mg with Folate 400 mg-Take one daily     gatifloxacin (ZYMAXID) 0.5 % SOLN Place one drop into the right eye 4 (four) times daily.     glucosamine-chondroitin 500-400 MG tablet Take 1 tablet by mouth daily.     guaiFENesin (MUCINEX) 600 MG 12 hr tablet Take 2 tablets (1,200 mg total) by mouth 2 (two) times daily as needed. 60 tablet 1   hydrochlorothiazide (HYDRODIURIL) 25 MG tablet TAKE ONE (1) TABLET BY MOUTH EVERY DAY 90 tablet 0   OVER THE COUNTER MEDICATION Drinks beet juice daily.     OVER THE COUNTER MEDICATION Drinks vegetable juice daily.     pantoprazole (PROTONIX) 40 MG tablet Take 1 tablet (40 mg total) by mouth daily. 30 tablet 5   RETIN-A 0.05  % cream APPLY TO AFFECTED AREA(S) AT BEDTIME. 45 g 1   rosuvastatin (CRESTOR) 20 MG tablet Take 1 tablet (20 mg total) by mouth daily. 90 tablet 3   sucralfate (CARAFATE) 1 g tablet Take 1 tablet (1 g total) by mouth 4 (four) times daily. For 1 month only 60 tablet 0   traZODone (DESYREL) 50 MG tablet TAKE 1/2 TABLET BY MOUTH IN THE MORNING AND 1 TABLET AT BEDTIME 135 tablet 5   triamcinolone (KENALOG) 0.1 % Apply topically.     triamcinolone (NASACORT) 55 MCG/ACT AERO nasal inhaler Place  2 sprays into the nose daily. 1 each 12   TURMERIC PO Take by mouth. Turmeric and Ginger -Take one pill tid     fexofenadine (ALLEGRA) 180 MG tablet Take 1 tablet (180 mg total) by mouth daily. 30 tablet 2   No current facility-administered medications on file prior to visit.        ROS:  All others reviewed and negative.  Objective        PE:  BP 134/82 (BP Location: Left Arm, Patient Position: Sitting, Cuff Size: Large)   Pulse 73   Temp 98.3 F (36.8 C) (Oral)   Ht '5\' 4"'$  (1.626 m)   Wt 167 lb (75.8 kg)   SpO2 90%   BMI 28.67 kg/m                 Constitutional: Pt appears in NAD               HENT: Head: NCAT.                Right Ear: External ear normal.                 Left Ear: External ear normal.                Eyes: . Pupils are equal, round, and reactive to light. Conjunctivae and EOM are normal               Nose: without d/c or deformity               Neck: Neck supple. Gross normal ROM               Cardiovascular: Normal rate and regular rhythm.                 Pulmonary/Chest: Effort normal and breath sounds without rales or wheezing.                Abd:  Soft, NT, ND, + BS, no organomegaly               Neurological: Pt is alert. At baseline orientation, motor grossly intact               Skin: Skin is warm. Distal right leg with 2 -3 cm area distal RLE anterioerly above the ankle red, tender, swelling without abscess,  LE edema - none,               Psychiatric: Pt behavior is  normal without agitation   Micro: none  Cardiac tracings I have personally interpreted today:  none  Pertinent Radiological findings (summarize): none   Lab Results  Component Value Date   WBC 7.1 11/14/2022   HGB 14.0 11/14/2022   HCT 42.2 11/14/2022   PLT 345.0 11/14/2022   GLUCOSE 88 11/14/2022   CHOL 184 09/17/2022   TRIG 71.0 09/17/2022   HDL 67.40 09/17/2022   LDLDIRECT 128.5 07/04/2013   LDLCALC 102 (H) 09/17/2022   ALT 14 11/14/2022   AST 20 11/14/2022   NA 139 11/14/2022   K 3.8 11/14/2022   CL 103 11/14/2022   CREATININE 0.77 11/14/2022   BUN 12 11/14/2022   CO2 31 11/14/2022   TSH 1.79 09/17/2022   INR 0.9 07/17/2009   HGBA1C 5.4 09/17/2022   Assessment/Plan:  [name] Bell is a 81 y.o. 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  Vitamin D deficiency Last vitamin D Lab Results  Component Value Date   VD25OH 80.30 09/17/2022   Stable, cont oral replacement   Abdominal pain Resolved with recent tx, exam benign, pt to  to f/u any worsening symptoms or concerns  Cellulitis of right leg Early mild incidental, also for zpack   COVID-19 virus infection Mild to mod, for antibx course paxlovid, cough med prn,  to f/u any worsening symptoms or concerns  Followup: Return in about 3 months (around 03/18/2023).  Cathlean Cower, MD 12/16/2022 10:17  [facility]

## 2022-12-16 NOTE — Assessment & Plan Note (Signed)
Mild to mod, for antibx course paxlovid, cough med prn,  to f/u any worsening symptoms or concerns

## 2022-12-16 NOTE — Assessment & Plan Note (Signed)
Last vitamin D Lab Results  Component Value Date   VD25OH 80.30 09/17/2022   Stable, cont oral replacement

## 2022-12-16 NOTE — Assessment & Plan Note (Signed)
Resolved with recent tx, exam benign, pt to  to f/u any worsening symptoms or concerns

## 2022-12-16 NOTE — Assessment & Plan Note (Signed)
Early mild incidental, also for zpack

## 2022-12-16 NOTE — Patient Instructions (Addendum)
Please take all new medication as prescribed - the paxlovid antibiotic, zpack antibiotic, cough medicine as needed  Please continue all other medications as before, and refills have been done for the tylenol #3 and flonase  Please have the pharmacy call with any other refills you may need.  Please continue your efforts at being more active, low cholesterol diet, and weight control.  Please keep your appointments with your specialists as you may have planned

## 2022-12-25 DIAGNOSIS — H353122 Nonexudative age-related macular degeneration, left eye, intermediate dry stage: Secondary | ICD-10-CM | POA: Diagnosis not present

## 2023-01-14 DIAGNOSIS — H26491 Other secondary cataract, right eye: Secondary | ICD-10-CM | POA: Diagnosis not present

## 2023-01-14 DIAGNOSIS — H26493 Other secondary cataract, bilateral: Secondary | ICD-10-CM | POA: Diagnosis not present

## 2023-02-09 ENCOUNTER — Other Ambulatory Visit: Payer: Self-pay | Admitting: Neurology

## 2023-02-11 DIAGNOSIS — H26492 Other secondary cataract, left eye: Secondary | ICD-10-CM | POA: Diagnosis not present

## 2023-03-06 ENCOUNTER — Ambulatory Visit: Payer: Medicare Other | Admitting: Neurology

## 2023-03-13 ENCOUNTER — Ambulatory Visit (INDEPENDENT_AMBULATORY_CARE_PROVIDER_SITE_OTHER): Payer: Medicare Other | Admitting: Neurology

## 2023-03-13 DIAGNOSIS — G243 Spasmodic torticollis: Secondary | ICD-10-CM

## 2023-03-13 MED ORDER — ONABOTULINUMTOXINA 100 UNITS IJ SOLR
400.0000 [IU] | Freq: Once | INTRAMUSCULAR | Status: AC
  Administered 2023-03-13: 330 [IU] via INTRAMUSCULAR

## 2023-03-13 NOTE — Procedures (Signed)
[facility]   Procedure Note Botox  Attending: Dr. Wells Guiles Eldon Zietlow  Preoperative Diagnosis(es): Cervical Dystonia  Result History  No swallow trouble.  Felt this time she did well.  Better ROM  Consent obtained from: The patient  Benefits discussed included, but were not limited to decreased muscle tightness, increased joint range of motion, and decreased pain.  Risk discussed included, but were not limited pain and discomfort, bleeding, bruising, excessive weakness, venous thrombosis, muscle atrophy and dysphagia.  A copy of the patient medication guide was given to the patient which explains the blackbox warning.  Informed consent was obtained  Patients identity and treatment sites confirmed yes.     Details of Procedure: Skin was cleaned with alcohol.  A 30 gauge, 1/2 inch needle was introduced to the target muscle (except splenius capitus, posterior approach, where 27 inch, 1 1/2 gauge needle was used).  Prior to injection, the needle plunger was aspirated to make sure the needle was not within a blood vessel.  There was no blood retrieved on aspiration.    Following is a summary of the muscles injected  And the amount of Botulinum toxin used:   Dilution 0.9% preservative free saline mixed with 100 u Botox type A to make 10 U per 0.1cc  Injections  Location Left  Right Units Number of sites        Sternocleidomastoid 60+40 0 100 1  Splenius Capitus, posterior approach 0 100 100 1  Splenius Capitus, lateral approach 0 60 60 1  Levator Scapulae      Trapezius 10/18/19/20  70         TOTAL UNITS:   330    Agent: Botulinum Type A ( Onobotulinum Toxin type A ). 4 vials of Botox were used, containing 100 units and freshly diluted with 1 mL of sterile, non-preserved saline    Total injected (Units): 330  Total wasted (Units): 70   Did well with injections.

## 2023-03-18 ENCOUNTER — Other Ambulatory Visit: Payer: Self-pay | Admitting: Internal Medicine

## 2023-03-18 ENCOUNTER — Ambulatory Visit (INDEPENDENT_AMBULATORY_CARE_PROVIDER_SITE_OTHER): Payer: Medicare Other | Admitting: Internal Medicine

## 2023-03-18 VITALS — BP 130/72 | HR 62 | Temp 97.7°F | Ht 64.0 in | Wt 162.0 lb

## 2023-03-18 DIAGNOSIS — R7302 Impaired glucose tolerance (oral): Secondary | ICD-10-CM

## 2023-03-18 DIAGNOSIS — E559 Vitamin D deficiency, unspecified: Secondary | ICD-10-CM

## 2023-03-18 DIAGNOSIS — E7849 Other hyperlipidemia: Secondary | ICD-10-CM | POA: Diagnosis not present

## 2023-03-18 DIAGNOSIS — H6692 Otitis media, unspecified, left ear: Secondary | ICD-10-CM | POA: Diagnosis not present

## 2023-03-18 DIAGNOSIS — E538 Deficiency of other specified B group vitamins: Secondary | ICD-10-CM | POA: Diagnosis not present

## 2023-03-18 LAB — CBC WITH DIFFERENTIAL/PLATELET
Basophils Absolute: 0.1 10*3/uL (ref 0.0–0.1)
Basophils Relative: 0.8 % (ref 0.0–3.0)
Eosinophils Absolute: 0.1 10*3/uL (ref 0.0–0.7)
Eosinophils Relative: 1.6 % (ref 0.0–5.0)
HCT: 41.8 % (ref 36.0–46.0)
Hemoglobin: 14 g/dL (ref 12.0–15.0)
Lymphocytes Relative: 25.8 % (ref 12.0–46.0)
Lymphs Abs: 1.6 10*3/uL (ref 0.7–4.0)
MCHC: 33.5 g/dL (ref 30.0–36.0)
MCV: 94.3 fl (ref 78.0–100.0)
Monocytes Absolute: 0.6 10*3/uL (ref 0.1–1.0)
Monocytes Relative: 10.6 % (ref 3.0–12.0)
Neutro Abs: 3.7 10*3/uL (ref 1.4–7.7)
Neutrophils Relative %: 61.2 % (ref 43.0–77.0)
Platelets: 323 10*3/uL (ref 150.0–400.0)
RBC: 4.43 Mil/uL (ref 3.87–5.11)
RDW: 13.6 % (ref 11.5–15.5)
WBC: 6.1 10*3/uL (ref 4.0–10.5)

## 2023-03-18 LAB — URINALYSIS, ROUTINE W REFLEX MICROSCOPIC
Bilirubin Urine: NEGATIVE
Hgb urine dipstick: NEGATIVE
Ketones, ur: NEGATIVE
Leukocytes,Ua: NEGATIVE
Nitrite: NEGATIVE
RBC / HPF: NONE SEEN (ref 0–?)
Specific Gravity, Urine: 1.01 (ref 1.000–1.030)
Total Protein, Urine: NEGATIVE
Urine Glucose: NEGATIVE
Urobilinogen, UA: 0.2 (ref 0.0–1.0)
pH: 7.5 (ref 5.0–8.0)

## 2023-03-18 LAB — BASIC METABOLIC PANEL
BUN: 9 mg/dL (ref 6–23)
CO2: 29 mEq/L (ref 19–32)
Calcium: 9.3 mg/dL (ref 8.4–10.5)
Chloride: 99 mEq/L (ref 96–112)
Creatinine, Ser: 0.71 mg/dL (ref 0.40–1.20)
GFR: 80.64 mL/min (ref >60.00)
Glucose, Bld: 91 mg/dL (ref 70–99)
Potassium: 4 mEq/L (ref 3.5–5.1)
Sodium: 135 mEq/L (ref 135–145)

## 2023-03-18 LAB — VITAMIN B12: Vitamin B-12: 760 pg/mL (ref 211–911)

## 2023-03-18 LAB — LIPID PANEL
Cholesterol: 214 mg/dL — ABNORMAL HIGH (ref 0–200)
HDL: 79.3 mg/dL (ref >39.00)
LDL Cholesterol: 121 mg/dL — ABNORMAL HIGH (ref 0–99)
NonHDL: 135.19
Total CHOL/HDL Ratio: 3
Triglycerides: 71 mg/dL (ref 0.0–149.0)
VLDL: 14.2 mg/dL (ref 0.0–40.0)

## 2023-03-18 LAB — HEPATIC FUNCTION PANEL
ALT: 17 U/L (ref 0–35)
AST: 21 U/L (ref 0–37)
Albumin: 4.2 g/dL (ref 3.5–5.2)
Alkaline Phosphatase: 70 U/L (ref 39–117)
Bilirubin, Direct: 0.1 mg/dL (ref 0.0–0.3)
Total Bilirubin: 0.5 mg/dL (ref 0.2–1.2)
Total Protein: 7.4 g/dL (ref 6.0–8.3)

## 2023-03-18 LAB — VITAMIN D 25 HYDROXY (VIT D DEFICIENCY, FRACTURES): VITD: 68.67 ng/mL (ref 30.00–100.00)

## 2023-03-18 LAB — MICROALBUMIN / CREATININE URINE RATIO
Creatinine,U: 36.5 mg/dL
Microalb Creat Ratio: 1.9 mg/g (ref 0.0–30.0)
Microalb, Ur: <0.7 mg/dL (ref 0.0–1.9)

## 2023-03-18 LAB — HEMOGLOBIN A1C: Hgb A1c MFr Bld: 5.5 % (ref 4.6–6.5)

## 2023-03-18 LAB — TSH: TSH: 1.33 u[IU]/mL (ref 0.35–5.50)

## 2023-03-18 MED ORDER — AMOXICILLIN-POT CLAVULANATE 875-125 MG PO TABS: 1.0000 | ORAL_TABLET | Freq: Two times a day (BID) | ORAL | 0 refills | Status: DC

## 2023-03-18 MED ORDER — ROSUVASTATIN CALCIUM 40 MG PO TABS: 40.0000 mg | ORAL_TABLET | Freq: Every day | ORAL | 3 refills | Status: DC

## 2023-03-18 NOTE — Patient Instructions (Signed)
Please take all new medication as prescribed - the antibiotic  Please continue all other medications as before, and refills have been done if requested.  Please have the pharmacy call with any other refills you may need.  Please continue your efforts at being more active, low cholesterol diet, and weight control.  You are otherwise up to date with prevention measures today.  Please keep your appointments with your specialists as you may have planned  Please go to the LAB at the blood drawing area for the tests to be done  You will be contacted by phone if any changes need to be made immediately.  Otherwise, you will receive a letter about your results with an explanation, but please check with MyChart first.  Please remember to sign up for MyChart if you have not done so, as this will be important to you in the future with finding out test results, communicating by private email, and scheduling acute appointments online when needed.  Please make an Appointment to return in 6 months, or sooner if needed 

## 2023-03-18 NOTE — Progress Notes (Signed)
Patient ID: [name] Bell, female   DOB: 10/05/1941, 82 y.o.   MRN: [id number]        Chief Complaint: follow up left ear pain, hld, hyperglycemia, low vit d       HPI:  [name] Bell is a 82 y.o. female here with c/o 3 days onset left ear pain with feverish, HA, fatigue, malaise.  Pt denies chest pain, increased sob or doe, wheezing, orthopnea, PND, increased LE swelling, palpitations, dizziness or syncope.   Pt denies polydipsia, polyuria, or new focal neuro s/s.    Pt denies fever, wt loss, night sweats, loss of appetite, or other constitutional symptoms  S/p recent efudex tx for face lesions, now resolved per derm.   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   right breast/ no chemo or radiation done   DIZZINESS 02/28/2010   ELEVATED BLOOD PRESSURE WITHOUT DIAGNOSIS OF HYPERTENSION 02/26/2009   patient denies   FATIGUE 02/26/2009   GERD 01/24/2008   History of blood transfusion ectopic pregnancy   Neuromuscular disorder ([place])    SHINGLES 11/06/2010   TOE PAIN 09/30/2010   VARICOSE VEINS, LOWER EXTREMITIES 08/16/2008   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   Past Surgical History:  Procedure Laterality Date   AUGMENTATION MAMMAPLASTY Bilateral 10/09/2016   BACK SURGERY  2010   fusion   BREAST LUMPECTOMY Right 05/14/2015   Procedure: RIGHT BREAST LUMPECTOMY;  Surgeon: Alphonsa Overall, MD;  Location: [place];  Service: General;  Laterality: Right;   BREAST RECONSTRUCTION WITH PLACEMENT OF TISSUE EXPANDER  AND FLEX HD (ACELLULAR HYDRATED DERMIS) Right 10/09/2016   Procedure: IMMEDIATE RIGHT BREAST RECONSTRUCTION WITH PLACEMENT OF SILICONE GEL IMPLANTS OR TISSUE EXPANDER AND ACELLULAR DERMAL MATRIX;  Surgeon: Crissie Reese, MD;  Location: [place];  Service: Plastics;  Laterality: Right;   ECTOPIC PREGNANCY SURGERY     at 82 yo with salpingectomy   FOOT SURGERY Right    bunion, hammer toe.  x 2   FOOT SURGERY Right 05/2016   3 surgeries on right foot.   INCISION AND DRAINAGE OF WOUND Right 10/09/2016   Procedure: IRRIGATION AND DEBRIDEMENT BREAST HEMATOMA;  Surgeon: Crissie Reese, MD;  Location: [place];  Service: Plastics;  Laterality: Right;   MASTECTOMY Right 10/09/2016   MASTECTOMY W/ SENTINEL NODE BIOPSY Right 10/09/2016   MASTECTOMY W/ SENTINEL NODE BIOPSY Right 10/09/2016   Procedure: RIGHT MASTECTOMY WITH SENTINEL LYMPH NODE BIOPSY;  Surgeon: Alphonsa Overall, MD;  Location: [place];  Service: General;  Laterality: Right;   s/p bilateral breasts implants  12/2007   s/p lumbar surgury  july 2010   TONSILLECTOMY      reports that she has never smoked. She has never used smokeless tobacco. She reports current alcohol use of about 6.0 standard drinks of alcohol per week. She reports that she does not use drugs. family history includes Asthma in her brother; Lung cancer in her father. Allergies  Allergen Reactions   Advil [Ibuprofen]     Per pt, makes her feel "confused when she takes Advil with meds   Gentamicin Other (See Comments)    Positive patch test  Positive patch test    Iron     Oxidized iron-makes hand and body break out when touched.   Manganese Nausea And Vomiting    Positive patch test  Positive patch test    Neomycin Nausea And Vomiting    Positive patch test  Positive patch test    Other Other (See Comments)    Ethyl cyanoacrylate: Positive patch test  Methyl methacrylate: Positive patch test  Disperse dye mix: Positive patch test  Disperse blue 106/124: Positive patch test   Perfume mix: Positive patch test  Ethyl cyanoacrylate: Positive patch test  Methyl methacrylate: Positive patch test  Disperse dye mix: Positive patch test  Disperse blue 106/124: Positive patch test  Perfume mix: Positive patch test    Latex     Added based on information entered during case entry, please review and add reactions, type, and severity as needed   No Known Allergies    Aluminum Rash   Current Outpatient Medications on File Prior to Visit  Medication Sig Dispense Refill   acetaminophen (TYLENOL) 325 MG tablet Take 650 mg by mouth every 6 (six) hours as needed.     acetaminophen-codeine (TYLENOL/CODEINE #3) 300-30 MG tablet Take 1 tablet by mouth every 6 (six) hours as needed. 30 tablet 0   B Complex-Folic Acid (B COMPLEX VITAMINS, W/ FA,) CAPS Take 1 capsule by mouth daily.     Cholecalciferol (D3 VITAMIN PO)      clonazePAM (KLONOPIN) 1 MG tablet TAKE ONE (1) TABLET BY MOUTH TWO (2) TIMES DAILY 60 tablet 4   Coenzyme Q10 60 MG CAPS Take by mouth.     Cyanocobalamin (VITAMIN B-12) 1000 MCG SUBL Place under the tongue. Vit b-12 1000 mg with Folate 400 mg-Take one daily     diazepam (VALIUM) 5 MG tablet Take by mouth.     fluticasone (FLONASE) 50 MCG/ACT nasal spray USE 2 SPRAYS INTO BOTH NOSTRILS DAILY 16 g 5   gatifloxacin (ZYMAXID) 0.5 % SOLN Place one drop into the right eye 4 (four) times daily.     glucosamine-chondroitin 500-400 MG tablet Take 1 tablet by mouth daily.     guaiFENesin (MUCINEX) 600 MG 12 hr tablet Take 2 tablets (1,200 mg total) by mouth 2 (two) times daily as needed. 60 tablet 1   hydrochlorothiazide (HYDRODIURIL) 25 MG tablet TAKE ONE (1) TABLET BY MOUTH EVERY DAY 90 tablet 0   OVER THE COUNTER MEDICATION Drinks beet juice daily.     OVER THE COUNTER MEDICATION Drinks vegetable juice daily.     pantoprazole (PROTONIX) 40 MG tablet Take 1 tablet (40 mg total) by mouth daily. 30 tablet 5   RETIN-A 0.05 % cream APPLY TO AFFECTED AREA(S) AT BEDTIME. 45  g 1   sucralfate (CARAFATE) 1 g tablet Take 1 tablet (1 g total) by mouth 4 (four) times daily. For 1 month only 60 tablet 0   traZODone (DESYREL) 50 MG tablet TAKE 1/2 TABLET BY MOUTH IN THE MORNING AND 1 TABLET AT BEDTIME 135 tablet 5   triamcinolone (KENALOG) 0.1 % Apply topically.     triamcinolone (NASACORT) 55 MCG/ACT AERO nasal inhaler Place 2 sprays into the nose daily. 1 each 12   TURMERIC PO Take by mouth. Turmeric and Ginger -Take one pill tid  fexofenadine (ALLEGRA) 180 MG tablet Take 1 tablet (180 mg total) by mouth daily. 30 tablet 2   HYDROcodone-acetaminophen (NORCO) 7.5-325 MG tablet      OnabotulinumtoxinA (BOTOX IJ)      No current facility-administered medications on file prior to visit.        ROS:  All others reviewed and negative.  Objective        PE:  BP 130/72   Pulse 62   Temp 97.7 F (36.5 C) (Oral)   Ht 5\' 4"  (1.626 m)   Wt 162 lb (73.5 kg)   SpO2 93%   BMI 27.81 kg/m                 Constitutional: Pt appears in NAD               HENT: Head: NCAT.                Right Ear: External ear normal.                 Left Ear: External ear normal. Left TM with severe erythema               Eyes: . Pupils are equal, round, and reactive to light. Conjunctivae and EOM are normal               Nose: without d/c or deformity               Neck: Neck supple. Gross normal ROM               Cardiovascular: Normal rate and regular rhythm.                 Pulmonary/Chest: Effort normal and breath sounds without rales or wheezing.                Abd:  Soft, NT, ND, + BS, no organomegaly               Neurological: Pt is alert. At baseline orientation, motor grossly intact               Skin: Skin is warm. No rashes, no other new lesions, LE edema - none               Psychiatric: Pt behavior is normal without agitation   Micro: none  Cardiac tracings I have personally interpreted today:  none  Pertinent Radiological findings (summarize): none   Lab Results   Component Value Date   WBC 6.1 03/18/2023   HGB 14.0 03/18/2023   HCT 41.8 03/18/2023   PLT 323.0 03/18/2023   GLUCOSE 91 03/18/2023   CHOL 214 (H) 03/18/2023   TRIG 71.0 03/18/2023   HDL 79.30 03/18/2023   LDLDIRECT 128.5 07/04/2013   LDLCALC 121 (H) 03/18/2023   ALT 17 03/18/2023   AST 21 03/18/2023   NA 135 03/18/2023   K 4.0 03/18/2023   CL 99 03/18/2023   CREATININE 0.71 03/18/2023   BUN 9 03/18/2023   CO2 29 03/18/2023   TSH 1.33 03/18/2023   INR 0.9 07/17/2009   HGBA1C 5.5 03/18/2023   MICROALBUR <0.7 03/18/2023   Assessment/Plan:  [name] Bell is a 83 y.o. White or Caucasian [1] female with  has a past medical history of Abnormal involuntary movements(781.0) (01/24/2008), ALLERGIC RHINITIS (01/24/2008), Allergy, ANEMIA-IRON DEFICIENCY (01/24/2008), ANXIETY (02/26/2009), BACK PAIN (09/30/2010), Breast cancer ([place]), Cancer of central portion of female breast ([place]) (05/04/2015), DIZZINESS (02/28/2010), ELEVATED BLOOD  PRESSURE WITHOUT DIAGNOSIS OF HYPERTENSION (02/26/2009), FATIGUE (02/26/2009), GERD (01/24/2008), History of blood transfusion (ectopic pregnancy), Neuromuscular disorder ([employer]), SHINGLES (11/06/2010), TOE PAIN (09/30/2010), VARICOSE VEINS, LOWER EXTREMITIES (08/16/2008), and Vertigo of central origin (02/26/2009).  Left otitis media Mild to mod, for antibx course augmentin bid,  to f/u any worsening symptoms or concerns  Hyperlipidemia Lab Results  Component Value Date   LDLCALC 121 (H) 03/18/2023   Uncontrolled, goal ldl <  pt to continue current statin crestor 40 mg qd and lower chol diet, declines other change   Impaired glucose tolerance Lab Results  Component Value Date   HGBA1C 5.5 03/18/2023   Stable, pt to continue current medical treatment  - diet, wt control   Vitamin D deficiency Last vitamin D Lab Results  Component Value Date   VD25OH 68.67 03/18/2023   Stable, cont oral replacement  Followup: Return in about 6 months (around  09/18/2023).  Cathlean Cower, MD 03/21/2023 7:26 PM [facility]

## 2023-03-21 ENCOUNTER — Encounter: Payer: Self-pay | Admitting: Internal Medicine

## 2023-03-21 DIAGNOSIS — H6692 Otitis media, unspecified, left ear: Secondary | ICD-10-CM | POA: Insufficient documentation

## 2023-03-21 NOTE — Assessment & Plan Note (Signed)
Lab Results  Component Value Date   LDLCALC 121 (H) 03/18/2023   Uncontrolled, goal ldl <  pt to continue current statin crestor 40 mg qd and lower chol diet, declines other change

## 2023-03-21 NOTE — Assessment & Plan Note (Signed)
Mild to mod, for antibx course augmentin bid,  to f/u any worsening symptoms or concerns

## 2023-03-21 NOTE — Assessment & Plan Note (Signed)
Last vitamin D Lab Results  Component Value Date   VD25OH 68.67 03/18/2023   Stable, cont oral replacement

## 2023-03-21 NOTE — Assessment & Plan Note (Signed)
Lab Results  Component Value Date   HGBA1C 5.5 03/18/2023   Stable, pt to continue current medical treatment  - diet, wt control

## 2023-04-13 DIAGNOSIS — H1045 Other chronic allergic conjunctivitis: Secondary | ICD-10-CM | POA: Diagnosis not present

## 2023-05-21 ENCOUNTER — Telehealth: Payer: Self-pay

## 2023-05-21 DIAGNOSIS — S9031XA Contusion of right foot, initial encounter: Secondary | ICD-10-CM | POA: Insufficient documentation

## 2023-05-21 DIAGNOSIS — Z9889 Other specified postprocedural states: Secondary | ICD-10-CM | POA: Insufficient documentation

## 2023-05-21 NOTE — Telephone Encounter (Signed)
Patient refused AWV 05/21/23

## 2023-06-01 ENCOUNTER — Other Ambulatory Visit: Payer: Self-pay | Admitting: Neurology

## 2023-06-12 ENCOUNTER — Ambulatory Visit (INDEPENDENT_AMBULATORY_CARE_PROVIDER_SITE_OTHER): Payer: Medicare Other | Admitting: Neurology

## 2023-06-12 DIAGNOSIS — G243 Spasmodic torticollis: Secondary | ICD-10-CM

## 2023-06-12 MED ORDER — ONABOTULINUMTOXINA 100 UNITS IJ SOLR
400.0000 [IU] | Freq: Once | INTRAMUSCULAR | Status: AC
  Administered 2023-06-12: 330 [IU] via INTRAMUSCULAR

## 2023-06-12 NOTE — Procedures (Signed)
[facility]   Procedure Note Botox  Attending: Dr. Lurena Joiner Meigan Pates  Preoperative Diagnosis(es): Cervical Dystonia  Result History  No swallow trouble.    Consent obtained from: The patient  Benefits discussed included, but were not limited to decreased muscle tightness, increased joint range of motion, and decreased pain.  Risk discussed included, but were not limited pain and discomfort, bleeding, bruising, excessive weakness, venous thrombosis, muscle atrophy and dysphagia.  A copy of the patient medication guide was given to the patient which explains the blackbox warning.  Informed consent was obtained  Patients identity and treatment sites confirmed yes.     Details of Procedure: Skin was cleaned with alcohol.  A 30 gauge, 1/2 inch needle was introduced to the target muscle (except splenius capitus, posterior approach, where 27 inch, 1 1/2 gauge needle was used).  Prior to injection, the needle plunger was aspirated to make sure the needle was not within a blood vessel.  There was no blood retrieved on aspiration.    Following is a summary of the muscles injected  And the amount of Botulinum toxin used:   Dilution 0.9% preservative free saline mixed with 100 u Botox type A to make 10 U per 0.1cc  Injections  Location Left  Right Units Number of sites        Sternocleidomastoid 60+40 0 100 1  Splenius Capitus, posterior approach 0 100 100 1  Splenius Capitus, lateral approach 0 60 60 1  Levator Scapulae      Trapezius 10/18/19/20  70         TOTAL UNITS:   330    Agent: Botulinum Type A ( Onobotulinum Toxin type A ). 4 vials of Botox were used, containing 100 units and freshly diluted with 1 mL of sterile, non-preserved saline    Total injected (Units): 330  Total wasted (Units): 70   Did well with injections.

## 2023-06-26 DIAGNOSIS — H353122 Nonexudative age-related macular degeneration, left eye, intermediate dry stage: Secondary | ICD-10-CM | POA: Diagnosis not present

## 2023-07-10 ENCOUNTER — Other Ambulatory Visit: Payer: Self-pay | Admitting: Neurology

## 2023-07-31 DIAGNOSIS — H10023 Other mucopurulent conjunctivitis, bilateral: Secondary | ICD-10-CM | POA: Diagnosis not present

## 2023-08-13 ENCOUNTER — Encounter (INDEPENDENT_AMBULATORY_CARE_PROVIDER_SITE_OTHER): Payer: Self-pay

## 2023-08-20 DIAGNOSIS — N952 Postmenopausal atrophic vaginitis: Secondary | ICD-10-CM | POA: Diagnosis not present

## 2023-08-20 DIAGNOSIS — Z853 Personal history of malignant neoplasm of breast: Secondary | ICD-10-CM | POA: Diagnosis not present

## 2023-08-20 DIAGNOSIS — Z01419 Encounter for gynecological examination (general) (routine) without abnormal findings: Secondary | ICD-10-CM | POA: Diagnosis not present

## 2023-08-20 DIAGNOSIS — Z6828 Body mass index (BMI) 28.0-28.9, adult: Secondary | ICD-10-CM | POA: Diagnosis not present

## 2023-08-28 ENCOUNTER — Other Ambulatory Visit: Payer: Self-pay | Admitting: Internal Medicine

## 2023-08-28 DIAGNOSIS — Z1231 Encounter for screening mammogram for malignant neoplasm of breast: Secondary | ICD-10-CM

## 2023-09-11 ENCOUNTER — Ambulatory Visit: Payer: Medicare Other | Admitting: Neurology

## 2023-09-16 ENCOUNTER — Other Ambulatory Visit: Payer: Self-pay | Admitting: Internal Medicine

## 2023-09-18 ENCOUNTER — Ambulatory Visit (INDEPENDENT_AMBULATORY_CARE_PROVIDER_SITE_OTHER): Payer: Medicare Other | Admitting: Neurology

## 2023-09-18 DIAGNOSIS — G243 Spasmodic torticollis: Secondary | ICD-10-CM

## 2023-09-18 MED ORDER — ONABOTULINUMTOXINA 100 UNITS IJ SOLR
400.0000 [IU] | Freq: Once | INTRAMUSCULAR | Status: AC
Start: 2023-09-18 — End: 2023-09-18
  Administered 2023-09-18: 330 [IU] via INTRAMUSCULAR

## 2023-09-18 NOTE — Procedures (Signed)
[facility]   Procedure Note Botox  Attending: Dr. Lurena Joiner Tyrisha Benninger  Preoperative Diagnosis(es): Cervical Dystonia  Result History  She has intermittent swallow trouble.  She also finds that it wears off a month before Botox.  Talked to her about changing to daxxify but she isn't sure.  She wants to research it first.    Consent obtained from: The patient  Benefits discussed included, but were not limited to decreased muscle tightness, increased joint range of motion, and decreased pain.  Risk discussed included, but were not limited pain and discomfort, bleeding, bruising, excessive weakness, venous thrombosis, muscle atrophy and dysphagia.  A copy of the patient medication guide was given to the patient which explains the blackbox warning.  Informed consent was obtained  Patients identity and treatment sites confirmed yes.     Details of Procedure: Skin was cleaned with alcohol.  A 30 gauge, 1/2 inch needle was introduced to the target muscle (except splenius capitus, posterior approach, where 27 inch, 1 1/2 gauge needle was used).  Prior to injection, the needle plunger was aspirated to make sure the needle was not within a blood vessel.  There was no blood retrieved on aspiration.    Following is a summary of the muscles injected  And the amount of Botulinum toxin used:   Dilution 0.9% preservative free saline mixed with 100 u Botox type A to make 10 U per 0.1cc  Injections  Location Left  Right Units Number of sites        Sternocleidomastoid 60+40 0 100 1  Splenius Capitus, posterior approach 0 100 100 1  Splenius Capitus, lateral approach 0 60 60 1  Levator Scapulae      Trapezius 10/18/19/20  70         TOTAL UNITS:   330    Agent: Botulinum Type A ( Onobotulinum Toxin type A ). 4 vials of Botox were used, containing 100 units and freshly diluted with 1 mL of sterile, non-preserved saline    Total injected (Units): 330  Total wasted (Units): 70   Did well with  injections.

## 2023-09-23 ENCOUNTER — Ambulatory Visit (INDEPENDENT_AMBULATORY_CARE_PROVIDER_SITE_OTHER): Payer: Medicare Other | Admitting: Internal Medicine

## 2023-09-23 ENCOUNTER — Ambulatory Visit (INDEPENDENT_AMBULATORY_CARE_PROVIDER_SITE_OTHER): Payer: Medicare Other

## 2023-09-23 VITALS — BP 158/96 | HR 60 | Temp 98.0°F | Ht 64.0 in | Wt 158.0 lb

## 2023-09-23 DIAGNOSIS — R030 Elevated blood-pressure reading, without diagnosis of hypertension: Secondary | ICD-10-CM

## 2023-09-23 DIAGNOSIS — E7849 Other hyperlipidemia: Secondary | ICD-10-CM | POA: Diagnosis not present

## 2023-09-23 DIAGNOSIS — J309 Allergic rhinitis, unspecified: Secondary | ICD-10-CM | POA: Diagnosis not present

## 2023-09-23 DIAGNOSIS — M79671 Pain in right foot: Secondary | ICD-10-CM

## 2023-09-23 DIAGNOSIS — Z23 Encounter for immunization: Secondary | ICD-10-CM

## 2023-09-23 DIAGNOSIS — M7731 Calcaneal spur, right foot: Secondary | ICD-10-CM | POA: Diagnosis not present

## 2023-09-23 DIAGNOSIS — I10 Essential (primary) hypertension: Secondary | ICD-10-CM | POA: Diagnosis not present

## 2023-09-23 DIAGNOSIS — E559 Vitamin D deficiency, unspecified: Secondary | ICD-10-CM

## 2023-09-23 DIAGNOSIS — Z472 Encounter for removal of internal fixation device: Secondary | ICD-10-CM | POA: Diagnosis not present

## 2023-09-23 DIAGNOSIS — G243 Spasmodic torticollis: Secondary | ICD-10-CM | POA: Diagnosis not present

## 2023-09-23 DIAGNOSIS — R7302 Impaired glucose tolerance (oral): Secondary | ICD-10-CM

## 2023-09-23 DIAGNOSIS — S9031XA Contusion of right foot, initial encounter: Secondary | ICD-10-CM | POA: Diagnosis not present

## 2023-09-23 MED ORDER — ROSUVASTATIN CALCIUM 20 MG PO TABS: 20.0000 mg | ORAL_TABLET | Freq: Every day | ORAL | 3 refills | Status: DC

## 2023-09-23 NOTE — Patient Instructions (Signed)
Ok to cut back on the crestor to 20 mg as you mentioned  Please continue all other medications as before, and refills have been done if requested.  Please have the pharmacy call with any other refills you may need.  Please continue your efforts at being more active, low cholesterol diet, and weight control  Please keep your appointments with your specialists as you may have planned  Please go to the XRAY Department in the first floor for the x-ray testing  You will be contacted by phone if any changes need to be made immediately.  Otherwise, you will receive a letter about your results with an explanation, but please check with MyChart first.  Please make an Appointment to return in 6 months, or sooner if needed

## 2023-09-24 ENCOUNTER — Telehealth: Payer: Self-pay | Admitting: Internal Medicine

## 2023-09-24 NOTE — Telephone Encounter (Signed)
Called and let Pt know

## 2023-09-24 NOTE — Telephone Encounter (Signed)
Patient wants someone to call her about her xrays from yesterday on her foot.  Phone:  [phone]

## 2023-09-24 NOTE — Telephone Encounter (Signed)
Sorry, we have not yet seen results, and xray results are coming slower than usual due to some difficultly getting the radiologist to do these as quickly as before., even up to several days.  sorry

## 2023-09-26 ENCOUNTER — Encounter: Payer: Self-pay | Admitting: Internal Medicine

## 2023-09-26 NOTE — Assessment & Plan Note (Signed)
Last vitamin D Lab Results  Component Value Date   VD25OH 68.67 03/18/2023   Stable, cont oral replacement

## 2023-09-26 NOTE — Assessment & Plan Note (Signed)
Stable overall, cont neuro with botox

## 2023-09-26 NOTE — Assessment & Plan Note (Signed)
Lab Results  Component Value Date   LDLCALC 121 (H) 03/18/2023   Uncontrolled, but only able to tolerate 20 mg daily due to abd pain,, pt to continue crestor 20 every day, declines other change today

## 2023-09-26 NOTE — Assessment & Plan Note (Signed)
Mild to mod, for otc nasacort asd, to f/u any worsening symptoms or concerns ?

## 2023-09-26 NOTE — Assessment & Plan Note (Signed)
Acute on chronic with recent object dropped on it - for xray

## 2023-09-26 NOTE — Assessment & Plan Note (Signed)
BP Readings from Last 3 Encounters:  09/23/23 (!) 158/96  03/18/23 130/72  12/16/22 134/82   Uncontrolled here, but pt states controlled at home, pt to continue medical treatment hct 25 every day

## 2023-09-26 NOTE — Assessment & Plan Note (Signed)
Lab Results  Component Value Date   HGBA1C 5.5 03/18/2023   Stable, pt to continue current medical treatment  - diet, wt control

## 2023-09-28 DIAGNOSIS — M79671 Pain in right foot: Secondary | ICD-10-CM | POA: Diagnosis not present

## 2023-09-28 NOTE — Telephone Encounter (Signed)
Very sorry, I have no control over the reading of the xrays which are taking longer these days due to radiologist availability and not being able to get this done as well as before in a timely fashion

## 2023-09-28 NOTE — Telephone Encounter (Signed)
Patient called to check on the status of her results. She is upset that she hasn't heard anything yet and said she does not want to wait. Patient would like a call back at [phone] ASAP.

## 2023-09-28 NOTE — Telephone Encounter (Signed)
Results not ready , will call when results are back.

## 2023-10-12 ENCOUNTER — Ambulatory Visit
Admission: RE | Admit: 2023-10-12 | Discharge: 2023-10-12 | Disposition: A | Discharge status: Home / Self Care | Payer: Medicare Other | Source: Ambulatory Visit | Attending: Internal Medicine | Admitting: Internal Medicine

## 2023-10-12 DIAGNOSIS — Z1231 Encounter for screening mammogram for malignant neoplasm of breast: Secondary | ICD-10-CM | POA: Diagnosis not present

## 2023-11-07 ENCOUNTER — Other Ambulatory Visit: Payer: Self-pay | Admitting: Neurology

## 2023-11-11 ENCOUNTER — Telehealth: Payer: Self-pay | Admitting: Neurology

## 2023-11-11 ENCOUNTER — Other Ambulatory Visit: Payer: Self-pay

## 2023-11-11 DIAGNOSIS — G243 Spasmodic torticollis: Secondary | ICD-10-CM

## 2023-11-11 NOTE — Telephone Encounter (Signed)
Called [facility]ateint and sent in RX

## 2023-11-11 NOTE — Telephone Encounter (Signed)
Pt called in stating the pharmacy sent in a request for refills of her trazodone earlier this month, but it was denied. She is wondering if there is an issue?

## 2023-11-18 DIAGNOSIS — L570 Actinic keratosis: Secondary | ICD-10-CM | POA: Diagnosis not present

## 2023-11-18 DIAGNOSIS — D485 Neoplasm of uncertain behavior of skin: Secondary | ICD-10-CM | POA: Diagnosis not present

## 2023-12-18 ENCOUNTER — Ambulatory Visit (INDEPENDENT_AMBULATORY_CARE_PROVIDER_SITE_OTHER): Payer: Medicare Other | Admitting: Neurology

## 2023-12-18 DIAGNOSIS — H353131 Nonexudative age-related macular degeneration, bilateral, early dry stage: Secondary | ICD-10-CM | POA: Diagnosis not present

## 2023-12-18 DIAGNOSIS — G243 Spasmodic torticollis: Secondary | ICD-10-CM | POA: Diagnosis not present

## 2023-12-18 MED ORDER — ONABOTULINUMTOXINA 100 UNITS IJ SOLR
400.0000 [IU] | Freq: Once | INTRAMUSCULAR | Status: AC
Start: 2023-12-18 — End: 2023-12-18
  Administered 2023-12-18: 330 [IU] via INTRAMUSCULAR

## 2023-12-18 NOTE — Procedures (Unsigned)
[facility]   Procedure Note Botox  Attending: Dr. Lurena Joiner Taralynn Quiett  Preoperative Diagnosis(es): Cervical Dystonia  Result History  Took 10 days to kick in.  Still dysphagia but worked better this last time.  Consent obtained from: The patient  Benefits discussed included, but were not limited to decreased muscle tightness, increased joint range of motion, and decreased pain.  Risk discussed included, but were not limited pain and discomfort, bleeding, bruising, excessive weakness, venous thrombosis, muscle atrophy and dysphagia.  A copy of the patient medication guide was given to the patient which explains the blackbox warning.  Informed consent was obtained  Patients identity and treatment sites confirmed yes.     Details of Procedure: Skin was cleaned with alcohol.  A 30 gauge, 1/2 inch needle was introduced to the target muscle (except splenius capitus, posterior approach, where 27 inch, 1 1/2 gauge needle was used).  Prior to injection, the needle plunger was aspirated to make sure the needle was not within a blood vessel.  There was no blood retrieved on aspiration.    Following is a summary of the muscles injected  And the amount of Botulinum toxin used:   Dilution 0.9% preservative free saline mixed with 100 u Botox type A to make 10 U per 0.1cc  Injections  Location Left  Right Units Number of sites        Sternocleidomastoid 60+40 0 100 1  Splenius Capitus, posterior approach 0 100 100 1  Splenius Capitus, lateral approach 0 60 60 1  Levator Scapulae      Trapezius 10/18/19/20  70         TOTAL UNITS:   330    Agent: Botulinum Type A ( Onobotulinum Toxin type A ). 4 vials of Botox were used, containing 100 units and freshly diluted with 1 mL of sterile, non-preserved saline    Total injected (Units): 330  Total wasted (Units): 70   Did well with injections.

## 2023-12-21 ENCOUNTER — Other Ambulatory Visit: Payer: Self-pay | Admitting: Neurology

## 2024-03-17 ENCOUNTER — Ambulatory Visit (INDEPENDENT_AMBULATORY_CARE_PROVIDER_SITE_OTHER): Payer: Medicare Other | Admitting: Neurology

## 2024-03-17 DIAGNOSIS — G243 Spasmodic torticollis: Secondary | ICD-10-CM | POA: Diagnosis not present

## 2024-03-17 MED ORDER — ONABOTULINUMTOXINA 100 UNITS IJ SOLR
400.0000 [IU] | Freq: Once | INTRAMUSCULAR | Status: AC
Start: 2024-03-17 — End: 2024-03-17
  Administered 2024-03-17: 330 [IU] via INTRAMUSCULAR

## 2024-03-17 NOTE — Procedures (Signed)
 [facility]   Procedure Note Botox  Attending: Dr. Lurena Joiner Rajah Lamba  Preoperative Diagnosis(es): Cervical Dystonia  Result History  Notes some pain over the left trapezius, especially in the morning.  Consent obtained from: The patient  Benefits discussed included, but were not limited to decreased muscle tightness, increased joint range of motion, and decreased pain.  Risk discussed included, but were not limited pain and discomfort, bleeding, bruising, excessive weakness, venous thrombosis, muscle atrophy and dysphagia.  A copy of the patient medication guide was given to the patient which explains the blackbox warning.  Informed consent was obtained  Patients identity and treatment sites confirmed yes.     Details of Procedure: Skin was cleaned with alcohol.  A 30 gauge, 1/2 inch needle was introduced to the target muscle (except splenius capitus, posterior approach, where 27 inch, 1 1/2 gauge needle was used).  Prior to injection, the needle plunger was aspirated to make sure the needle was not within a blood vessel.  There was no blood retrieved on aspiration.    Following is a summary of the muscles injected  And the amount of Botulinum toxin used:   Dilution 0.9% preservative free saline mixed with 100 u Botox type A to make 10 U per 0.1cc  Injections  Location Left  Right Units Number of sites        Sternocleidomastoid 60+40 0 100 1  Splenius Capitus, posterior approach 0 100 100 1  Splenius Capitus, lateral approach 0 60 60 1  Levator Scapulae      Trapezius 10/18/19/20  70         TOTAL UNITS:   330    Agent: Botulinum Type A ( Onobotulinum Toxin type A ). 4 vials of Botox were used, containing 100 units and freshly diluted with 1 mL of sterile, non-preserved saline    Total injected (Units): 330  Total wasted (Units): 70   Did well with injections.

## 2024-03-19 ENCOUNTER — Other Ambulatory Visit: Payer: Self-pay | Admitting: Internal Medicine

## 2024-03-22 ENCOUNTER — Encounter: Payer: Self-pay | Admitting: Internal Medicine

## 2024-03-22 ENCOUNTER — Ambulatory Visit (INDEPENDENT_AMBULATORY_CARE_PROVIDER_SITE_OTHER): Payer: Medicare Other | Admitting: Internal Medicine

## 2024-03-22 VITALS — BP 124/70 | HR 59 | Temp 98.3°F | Ht 64.0 in | Wt 160.0 lb

## 2024-03-22 DIAGNOSIS — E559 Vitamin D deficiency, unspecified: Secondary | ICD-10-CM

## 2024-03-22 DIAGNOSIS — E538 Deficiency of other specified B group vitamins: Secondary | ICD-10-CM | POA: Diagnosis not present

## 2024-03-22 DIAGNOSIS — R7302 Impaired glucose tolerance (oral): Secondary | ICD-10-CM

## 2024-03-22 DIAGNOSIS — E7849 Other hyperlipidemia: Secondary | ICD-10-CM

## 2024-03-22 LAB — CBC WITH DIFFERENTIAL/PLATELET
Basophils Absolute: 0.1 10*3/uL (ref 0.0–0.1)
Basophils Relative: 0.8 % (ref 0.0–3.0)
Eosinophils Absolute: 0.1 10*3/uL (ref 0.0–0.7)
Eosinophils Relative: 1.1 % (ref 0.0–5.0)
HCT: 39.7 % (ref 36.0–46.0)
Hemoglobin: 13.4 g/dL (ref 12.0–15.0)
Lymphocytes Relative: 30.1 % (ref 12.0–46.0)
Lymphs Abs: 2.3 10*3/uL (ref 0.7–4.0)
MCHC: 33.7 g/dL (ref 30.0–36.0)
MCV: 94.4 fl (ref 78.0–100.0)
Monocytes Absolute: 0.8 10*3/uL (ref 0.1–1.0)
Monocytes Relative: 11 % (ref 3.0–12.0)
Neutro Abs: 4.3 10*3/uL (ref 1.4–7.7)
Neutrophils Relative %: 57 % (ref 43.0–77.0)
Platelets: 287 10*3/uL (ref 150.0–400.0)
RBC: 4.2 Mil/uL (ref 3.87–5.11)
RDW: 13 % (ref 11.5–15.5)
WBC: 7.5 10*3/uL (ref 4.0–10.5)

## 2024-03-22 LAB — URINALYSIS, ROUTINE W REFLEX MICROSCOPIC
Bilirubin Urine: NEGATIVE
Hgb urine dipstick: NEGATIVE
Ketones, ur: NEGATIVE
Nitrite: NEGATIVE
RBC / HPF: NONE SEEN (ref 0–?)
Specific Gravity, Urine: 1.015 (ref 1.000–1.030)
Total Protein, Urine: NEGATIVE
Urine Glucose: NEGATIVE
Urobilinogen, UA: 0.2 (ref 0.0–1.0)
pH: 6 (ref 5.0–8.0)

## 2024-03-22 LAB — BASIC METABOLIC PANEL
BUN: 20 mg/dL (ref 6–23)
CO2: 31 meq/L (ref 19–32)
Calcium: 9.3 mg/dL (ref 8.4–10.5)
Chloride: 102 meq/L (ref 96–112)
Creatinine, Ser: 0.84 mg/dL (ref 0.40–1.20)
GFR: 65.44 mL/min (ref >60.00)
Glucose, Bld: 84 mg/dL (ref 70–99)
Potassium: 4.1 meq/L (ref 3.5–5.1)
Sodium: 139 meq/L (ref 135–145)

## 2024-03-22 LAB — MICROALBUMIN / CREATININE URINE RATIO
Creatinine,U: 62.4 mg/dL
Microalb Creat Ratio: UNDETERMINED mg/g (ref 0.0–30.0)
Microalb, Ur: <0.7 mg/dL

## 2024-03-22 LAB — VITAMIN B12: Vitamin B-12: 1389 pg/mL — ABNORMAL HIGH (ref 211–911)

## 2024-03-22 LAB — HEPATIC FUNCTION PANEL
ALT: 16 U/L (ref 0–35)
AST: 21 U/L (ref 0–37)
Albumin: 4.3 g/dL (ref 3.5–5.2)
Alkaline Phosphatase: 52 U/L (ref 39–117)
Bilirubin, Direct: 0.1 mg/dL (ref 0.0–0.3)
Total Bilirubin: 0.4 mg/dL (ref 0.2–1.2)
Total Protein: 7.3 g/dL (ref 6.0–8.3)

## 2024-03-22 LAB — LIPID PANEL
Cholesterol: 147 mg/dL (ref 0–200)
HDL: 74.2 mg/dL (ref >39.00)
LDL Cholesterol: 63 mg/dL (ref 0–99)
NonHDL: 72.48
Total CHOL/HDL Ratio: 2
Triglycerides: 46 mg/dL (ref 0.0–149.0)
VLDL: 9.2 mg/dL (ref 0.0–40.0)

## 2024-03-22 LAB — TSH: TSH: 1.31 u[IU]/mL (ref 0.35–5.50)

## 2024-03-22 LAB — HEMOGLOBIN A1C: Hgb A1c MFr Bld: 5.5 % (ref 4.6–6.5)

## 2024-03-22 LAB — VITAMIN D 25 HYDROXY (VIT D DEFICIENCY, FRACTURES): VITD: 88.8 ng/mL (ref 30.00–100.00)

## 2024-03-22 NOTE — Progress Notes (Signed)
 Patient ID: [name] Bell, female   DOB: 1941/07/28, 83 y.o.   MRN: [id number]        Chief Complaint: follow up HLD and hyperglycemia , low vit d       HPI:  [name] Bell is a 83 y.o. female here overall doing ok,  Pt denies chest pain, increased sob or doe, wheezing, orthopnea, PND, increased LE swelling, palpitations, dizziness or syncope.   Pt denies polydipsia, polyuria, or new focal neuro s/s.    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.0) 01/24/2008   ALLERGIC RHINITIS 01/24/2008   Allergy    ANEMIA-IRON DEFICIENCY 01/24/2008   pt denied   ANXIETY 02/26/2009   BACK PAIN 09/30/2010   Breast cancer (HCC)    Cancer of central portion of female breast (HCC) 05/04/2015   right breast/ no chemo or radiation done   DIZZINESS 02/28/2010   ELEVATED BLOOD PRESSURE WITHOUT DIAGNOSIS OF HYPERTENSION 02/26/2009   patient denies   FATIGUE 02/26/2009   GERD 01/24/2008   History of blood transfusion ectopic pregnancy   Neuromuscular disorder (HCC)    SHINGLES 11/06/2010   TOE PAIN 09/30/2010   VARICOSE VEINS, LOWER EXTREMITIES 08/16/2008   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   Past Surgical History:  Procedure Laterality Date   AUGMENTATION MAMMAPLASTY Bilateral 10/09/2016   BACK SURGERY  2010   fusion   BREAST LUMPECTOMY Right 05/14/2015   Procedure: RIGHT BREAST LUMPECTOMY;  Surgeon: Ovidio Kin, MD;  Location: [facility] OR;  Service: General;  Laterality: Right;   BREAST RECONSTRUCTION WITH PLACEMENT OF TISSUE EXPANDER AND FLEX HD (ACELLULAR HYDRATED DERMIS) Right 10/09/2016   Procedure: IMMEDIATE RIGHT BREAST RECONSTRUCTION WITH  PLACEMENT OF SILICONE GEL IMPLANTS OR TISSUE EXPANDER AND ACELLULAR DERMAL MATRIX;  Surgeon: Etter Sjogren, MD;  Location: [facility] OR;  Service: Plastics;  Laterality: Right;   ECTOPIC PREGNANCY SURGERY     at 83 yo with salpingectomy   FOOT SURGERY Right    bunion, hammer toe.  x 2   FOOT SURGERY Right 05/2016   3 surgeries on right foot.   INCISION AND DRAINAGE OF WOUND Right 10/09/2016   Procedure: IRRIGATION AND DEBRIDEMENT BREAST HEMATOMA;  Surgeon: Etter Sjogren, MD;  Location: [facility] OR;  Service: Plastics;  Laterality: Right;   MASTECTOMY Right 10/09/2016   MASTECTOMY W/ SENTINEL NODE BIOPSY Right 10/09/2016   MASTECTOMY W/ SENTINEL NODE BIOPSY Right 10/09/2016   Procedure: RIGHT MASTECTOMY WITH SENTINEL LYMPH NODE BIOPSY;  Surgeon: Ovidio Kin, MD;  Location: [facility] OR;  Service: General;  Laterality: Right;   s/p bilateral breasts implants  12/2007   s/p lumbar surgury  july 2010   TONSILLECTOMY      reports that she has never smoked. She has never used smokeless tobacco. She reports current alcohol use of about 6.0 standard drinks of alcohol per week. She reports that she does not use drugs. family history includes Asthma in her brother; Lung cancer in her father. Allergies  Allergen Reactions   Gentamicin Other (See Comments)    Positive patch test  Positive  patch test    Iron     Oxidized iron-makes hand and body break out when touched.   Other Other (See Comments)    Ethyl cyanoacrylate: Positive patch test  Methyl methacrylate: Positive patch test  Disperse dye mix: Positive patch test  Disperse blue 106/124: Positive patch test  Perfume mix: Positive patch test  Ethyl cyanoacrylate: Positive patch test  Methyl methacrylate: Positive patch test  Disperse dye mix: Positive patch test  Disperse blue 106/124: Positive patch test  Perfume mix: Positive patch test    No Known Allergies    Aluminum Rash   Current Outpatient Medications on File Prior to Visit  Medication Sig  Dispense Refill   acetaminophen (TYLENOL) 325 MG tablet Take 650 mg by mouth every 6 (six) hours as needed.     acetaminophen-codeine (TYLENOL #3) 300-30 MG tablet Take 1 tablet by mouth every 6 (six) hours as needed. 30 tablet 0   B Complex-Folic Acid (B COMPLEX VITAMINS, W/ FA,) CAPS Take 1 capsule by mouth daily.     Cholecalciferol (D3 VITAMIN PO)      clonazePAM (KLONOPIN) 1 MG tablet TAKE ONE (1) TABLET BY MOUTH TWO (2) TIMES DAILY 60 tablet 4   Coenzyme Q10 60 MG CAPS Take by mouth.     Cyanocobalamin (VITAMIN B-12) 1000 MCG SUBL Place under the tongue. Vit b-12 1000 mg with Folate 400 mg-Take one daily     fluticasone (FLONASE) 50 MCG/ACT nasal spray USE 2 SPRAYS INTO BOTH NOSTRILS DAILY 16 g 5   glucosamine-chondroitin 500-400 MG tablet Take 1 tablet by mouth daily.     hydrochlorothiazide (HYDRODIURIL) 25 MG tablet TAKE ONE (1) TABLET BY MOUTH EVERY DAY 90 tablet 0   HYDROcodone-acetaminophen (NORCO) 7.5-325 MG tablet      OnabotulinumtoxinA (BOTOX IJ)      OVER THE COUNTER MEDICATION Drinks beet juice daily.     OVER THE COUNTER MEDICATION Drinks vegetable juice daily.     pantoprazole (PROTONIX) 40 MG tablet Take 1 tablet (40 mg total) by mouth daily. 30 tablet 5   RETIN-A 0.05 % cream APPLY TO AFFECTED AREA(S) AT BEDTIME. 45 g 1   rosuvastatin (CRESTOR) 20 MG tablet Take 1 tablet (20 mg total) by mouth daily. 90 tablet 3   traZODone (DESYREL) 50 MG tablet TAKE (1/2) TABLET BY MOUTH TABLET BY MOUTH IN THE MORNING AND 1 TABLET AT BEDTIME 135 tablet 0   triamcinolone (KENALOG) 0.1 % Apply topically.     TURMERIC PO Take by mouth. Turmeric and Ginger -Take one pill tid     No current facility-administered medications on file prior to visit.        ROS:  All others reviewed and negative.  Objective        PE:  BP 124/70 (BP Location: Right Arm, Patient Position: Sitting, Cuff Size: Normal)   Pulse (!) 59   Temp 98.3 F (36.8 C) (Oral)   Ht 5\' 4"  (1.626 m)   Wt 160 lb (72.6  kg)   SpO2 94%   BMI 27.46 kg/m                 Constitutional: Pt appears in NAD               HENT: Head: NCAT.                Right Ear: External ear normal.  Left Ear: External ear normal.                Eyes: . Pupils are equal, round, and reactive to light. Conjunctivae and EOM are normal               Nose: without d/c or deformity               Neck: Neck supple. Gross normal ROM               Cardiovascular: Normal rate and regular rhythm.                 Pulmonary/Chest: Effort normal and breath sounds without rales or wheezing.                Abd:  Soft, NT, ND, + BS, no organomegaly               Neurological: Pt is alert. At baseline orientation, motor grossly intact               Skin: Skin is warm. No rashes, no other new lesions, LE edema - none               Psychiatric: Pt behavior is normal without agitation   Micro: none  Cardiac tracings I have personally interpreted today:  none  Pertinent Radiological findings (summarize): none   Lab Results  Component Value Date   WBC 7.5 03/22/2024   HGB 13.4 03/22/2024   HCT 39.7 03/22/2024   PLT 287.0 03/22/2024   GLUCOSE 84 03/22/2024   CHOL 147 03/22/2024   TRIG 46.0 03/22/2024   HDL 74.20 03/22/2024   LDLDIRECT 128.5 07/04/2013   LDLCALC 63 03/22/2024   ALT 16 03/22/2024   AST 21 03/22/2024   NA 139 03/22/2024   K 4.1 03/22/2024   CL 102 03/22/2024   CREATININE 0.84 03/22/2024   BUN 20 03/22/2024   CO2 31 03/22/2024   TSH 1.31 03/22/2024   INR 0.9 07/17/2009   HGBA1C 5.5 03/22/2024   MICROALBUR <0.7 03/22/2024   Assessment/Plan:  Jemmie Thorlund Lococo is a 83 y.o. White or Caucasian [1] female with  has a past medical history of Abnormal involuntary movements(781.0) (01/24/2008), ALLERGIC RHINITIS (01/24/2008), Allergy, ANEMIA-IRON DEFICIENCY (01/24/2008), ANXIETY (02/26/2009), BACK PAIN (09/30/2010), Breast cancer (HCC), Cancer of central portion of female breast (HCC) (05/04/2015), DIZZINESS  (02/28/2010), ELEVATED BLOOD PRESSURE WITHOUT DIAGNOSIS OF HYPERTENSION (02/26/2009), FATIGUE (02/26/2009), GERD (01/24/2008), History of blood transfusion (ectopic pregnancy), Neuromuscular disorder (HCC), SHINGLES (11/06/2010), TOE PAIN (09/30/2010), VARICOSE VEINS, LOWER EXTREMITIES (08/16/2008), and Vertigo of central origin (02/26/2009).  Vitamin D deficiency Last vitamin D Lab Results  Component Value Date   VD25OH 88.80 03/22/2024   Stable, cont oral replacement   Impaired glucose tolerance Lab Results  Component Value Date   HGBA1C 5.5 03/22/2024   Stable, pt to continue current medical treatment  - diet, wt control   Hyperlipidemia Lab Results  Component Value Date   LDLCALC 63 03/22/2024   Stable, pt to continue current statin crestor 20 qd  Followup: Return in about 6 months (around 09/22/2024).  Oliver Barre, MD 03/22/2024 9:47 PM [facility]

## 2024-03-22 NOTE — Assessment & Plan Note (Signed)
 Lab Results  Component Value Date   LDLCALC 63 03/22/2024   Stable, pt to continue current statin crestor 20 qd

## 2024-03-22 NOTE — Patient Instructions (Signed)

## 2024-03-22 NOTE — Assessment & Plan Note (Signed)
 Last vitamin D Lab Results  Component Value Date   VD25OH 88.80 03/22/2024   Stable, cont oral replacement

## 2024-03-22 NOTE — Progress Notes (Signed)
 The test results show that your current treatment is OK, as the tests are stable.  Please continue the same plan.  There is no other need for change of treatment or further evaluation based on these results, at this time.  thanks

## 2024-03-22 NOTE — Assessment & Plan Note (Signed)
 Lab Results  Component Value Date   HGBA1C 5.5 03/22/2024   Stable, pt to continue current medical treatment  - diet, wt control

## 2024-04-20 ENCOUNTER — Other Ambulatory Visit: Payer: Self-pay | Admitting: Neurology

## 2024-04-20 DIAGNOSIS — G243 Spasmodic torticollis: Secondary | ICD-10-CM

## 2024-04-21 NOTE — Telephone Encounter (Signed)
 Patient is only taking the one at bedtime. Her Rx was not changed but she does not like to take it in the morning

## 2024-05-19 ENCOUNTER — Other Ambulatory Visit: Payer: Self-pay | Admitting: Neurology

## 2024-05-26 ENCOUNTER — Other Ambulatory Visit: Payer: Self-pay | Admitting: Neurology

## 2024-05-26 MED ORDER — CLONAZEPAM 1 MG PO TABS: ORAL_TABLET | ORAL | 4 refills | Status: DC

## 2024-06-14 ENCOUNTER — Other Ambulatory Visit: Payer: Self-pay | Admitting: Internal Medicine

## 2024-06-24 ENCOUNTER — Telehealth: Payer: Self-pay | Admitting: Pharmacy Technician

## 2024-06-24 ENCOUNTER — Telehealth: Payer: Self-pay

## 2024-06-24 ENCOUNTER — Ambulatory Visit (INDEPENDENT_AMBULATORY_CARE_PROVIDER_SITE_OTHER): Admitting: Neurology

## 2024-06-24 ENCOUNTER — Other Ambulatory Visit (HOSPITAL_COMMUNITY): Payer: Self-pay

## 2024-06-24 DIAGNOSIS — G243 Spasmodic torticollis: Secondary | ICD-10-CM | POA: Diagnosis not present

## 2024-06-24 MED ORDER — ONABOTULINUMTOXINA 100 UNITS IJ SOLR
400.0000 [IU] | Freq: Once | INTRAMUSCULAR | Status: AC
  Administered 2024-06-24: 330 [IU] via INTRAMUSCULAR

## 2024-06-24 NOTE — Telephone Encounter (Signed)
 Pharmacy Patient Advocate Encounter   Received notification from Pt Calls Messages that prior authorization for XEOMIN 200 x2 is required/requested.   Insurance verification completed.   The patient is insured through [facility] .   Per test claim: PA required; PA submitted to above mentioned insurance via CoverMyMeds Key/confirmation #/EOC [id number] Status is pending

## 2024-06-24 NOTE — Telephone Encounter (Signed)
 PA has been submitted, and telephone encounter has been created. Please see telephone encounter dated 6.27.25.

## 2024-06-24 NOTE — Telephone Encounter (Signed)
 Dr .Evonnie would like to switch this patient to Xeomin [id number] Cervical Dystonia 400 units

## 2024-06-24 NOTE — Procedures (Signed)
 [facility]   Procedure Note Botox   Attending: Dr. Asberry Quanasia Defino  Preoperative Diagnosis(es): Cervical Dystonia  Result History  Notes it wears off about 10 days prior.  She has very significant pulling of her head to the right.  We are going to change next visit to Xeomin, and if that does not help I would like to change to Daxxify because of dysphagia.  Consent obtained from: The patient  Benefits discussed included, but were not limited to decreased muscle tightness, increased joint range of motion, and decreased pain.  Risk discussed included, but were not limited pain and discomfort, bleeding, bruising, excessive weakness, venous thrombosis, muscle atrophy and dysphagia.  A copy of the patient medication guide was given to the patient which explains the blackbox warning.  Informed consent was obtained  Patients identity and treatment sites confirmed yes.     Details of Procedure: Skin was cleaned with alcohol.  A 30 gauge, 1/2 inch needle was introduced to the target muscle (except splenius capitus, posterior approach, where 27 inch, 1 1/2 gauge needle was used).  Prior to injection, the needle plunger was aspirated to make sure the needle was not within a blood vessel.  There was no blood retrieved on aspiration.    Following is a summary of the muscles injected  And the amount of Botulinum toxin used:   Dilution 0.9% preservative free saline mixed with 100 u Botox  type A to make 10 U per 0.1cc  Injections  Location Left  Right Units Number of sites        Sternocleidomastoid 60+40 0 100 1  Splenius Capitus, posterior approach 0 100 100 1  Splenius Capitus, lateral approach 0 60 60 1  Levator Scapulae      Trapezius 10/18/19/20  70         TOTAL UNITS:   330    Agent: Botulinum Type A ( Onobotulinum Toxin type A ). 4 vials of Botox  were used, containing 100 units and freshly diluted with 1 mL of sterile, non-preserved saline    Total injected (Units): 330  Total  wasted (Units): 70   Did well with injections.

## 2024-06-28 ENCOUNTER — Other Ambulatory Visit (HOSPITAL_COMMUNITY): Payer: Self-pay

## 2024-06-28 NOTE — Telephone Encounter (Signed)
 Pharmacy Patient Advocate Encounter  Received notification from [facility] that Prior Authorization for Xiomen has been APPROVED from 06/24/24 to 06/24/25 /  PA #/Case ID/Reference #: [id number]  Unfortunately her copay is $1,274.61

## 2024-07-04 ENCOUNTER — Other Ambulatory Visit (HOSPITAL_COMMUNITY): Payer: Self-pay

## 2024-07-04 NOTE — Telephone Encounter (Signed)
 Looks like patient still had an unmet deductible of around $2000.

## 2024-07-22 ENCOUNTER — Other Ambulatory Visit: Payer: Self-pay | Admitting: Neurology

## 2024-07-22 DIAGNOSIS — G243 Spasmodic torticollis: Secondary | ICD-10-CM

## 2024-08-22 DIAGNOSIS — Z6829 Body mass index (BMI) 29.0-29.9, adult: Secondary | ICD-10-CM | POA: Diagnosis not present

## 2024-08-22 DIAGNOSIS — N952 Postmenopausal atrophic vaginitis: Secondary | ICD-10-CM | POA: Diagnosis not present

## 2024-08-22 DIAGNOSIS — Z853 Personal history of malignant neoplasm of breast: Secondary | ICD-10-CM | POA: Diagnosis not present

## 2024-08-22 DIAGNOSIS — Z779 Other contact with and (suspected) exposures hazardous to health: Secondary | ICD-10-CM | POA: Diagnosis not present

## 2024-09-02 ENCOUNTER — Other Ambulatory Visit: Payer: Self-pay | Admitting: Internal Medicine

## 2024-09-02 DIAGNOSIS — Z1231 Encounter for screening mammogram for malignant neoplasm of breast: Secondary | ICD-10-CM

## 2024-09-23 ENCOUNTER — Ambulatory Visit: Admitting: Internal Medicine

## 2024-09-26 ENCOUNTER — Encounter: Payer: Self-pay | Admitting: Internal Medicine

## 2024-09-26 ENCOUNTER — Ambulatory Visit: Admitting: Internal Medicine

## 2024-09-26 VITALS — BP 128/72 | HR 65 | Temp 98.4°F | Ht 64.0 in | Wt 164.0 lb

## 2024-09-26 DIAGNOSIS — H6123 Impacted cerumen, bilateral: Secondary | ICD-10-CM | POA: Diagnosis not present

## 2024-09-26 DIAGNOSIS — E7849 Other hyperlipidemia: Secondary | ICD-10-CM

## 2024-09-26 DIAGNOSIS — I10 Essential (primary) hypertension: Secondary | ICD-10-CM

## 2024-09-26 DIAGNOSIS — E559 Vitamin D deficiency, unspecified: Secondary | ICD-10-CM

## 2024-09-26 DIAGNOSIS — R21 Rash and other nonspecific skin eruption: Secondary | ICD-10-CM | POA: Diagnosis not present

## 2024-09-26 DIAGNOSIS — Z23 Encounter for immunization: Secondary | ICD-10-CM | POA: Diagnosis not present

## 2024-09-26 DIAGNOSIS — R7302 Impaired glucose tolerance (oral): Secondary | ICD-10-CM

## 2024-09-26 LAB — HEPATIC FUNCTION PANEL
ALT: 18 U/L (ref 0–35)
AST: 23 U/L (ref 0–37)
Albumin: 4.5 g/dL (ref 3.5–5.2)
Alkaline Phosphatase: 48 U/L (ref 39–117)
Bilirubin, Direct: 0.1 mg/dL (ref 0.0–0.3)
Total Bilirubin: 0.5 mg/dL (ref 0.2–1.2)
Total Protein: 7.4 g/dL (ref 6.0–8.3)

## 2024-09-26 LAB — CBC WITH DIFFERENTIAL/PLATELET
Basophils Absolute: 0.1 K/uL (ref 0.0–0.1)
Basophils Relative: 1 % (ref 0.0–3.0)
Eosinophils Absolute: 0.1 K/uL (ref 0.0–0.7)
Eosinophils Relative: 1.7 % (ref 0.0–5.0)
HCT: 40.4 % (ref 36.0–46.0)
Hemoglobin: 13.6 g/dL (ref 12.0–15.0)
Lymphocytes Relative: 27.5 % (ref 12.0–46.0)
Lymphs Abs: 1.8 K/uL (ref 0.7–4.0)
MCHC: 33.8 g/dL (ref 30.0–36.0)
MCV: 93.8 fl (ref 78.0–100.0)
Monocytes Absolute: 0.7 K/uL (ref 0.1–1.0)
Monocytes Relative: 9.7 % (ref 3.0–12.0)
Neutro Abs: 4 K/uL (ref 1.4–7.7)
Neutrophils Relative %: 60.1 % (ref 43.0–77.0)
Platelets: 283 K/uL (ref 150.0–400.0)
RBC: 4.3 Mil/uL (ref 3.87–5.11)
RDW: 12.8 % (ref 11.5–15.5)
WBC: 6.7 K/uL (ref 4.0–10.5)

## 2024-09-26 LAB — LIPID PANEL
Cholesterol: 140 mg/dL (ref 0–200)
HDL: 63.7 mg/dL (ref >39.00)
LDL Cholesterol: 65 mg/dL (ref 0–99)
NonHDL: 76.71
Total CHOL/HDL Ratio: 2
Triglycerides: 59 mg/dL (ref 0.0–149.0)
VLDL: 11.8 mg/dL (ref 0.0–40.0)

## 2024-09-26 LAB — BASIC METABOLIC PANEL WITH GFR
BUN: 13 mg/dL (ref 6–23)
CO2: 30 meq/L (ref 19–32)
Calcium: 9.7 mg/dL (ref 8.4–10.5)
Chloride: 103 meq/L (ref 96–112)
Creatinine, Ser: 0.79 mg/dL (ref 0.40–1.20)
GFR: 70.19 mL/min (ref >60.00)
Glucose, Bld: 91 mg/dL (ref 70–99)
Potassium: 4 meq/L (ref 3.5–5.1)
Sodium: 140 meq/L (ref 135–145)

## 2024-09-26 LAB — HEMOGLOBIN A1C: Hgb A1c MFr Bld: 5.9 % (ref 4.6–6.5)

## 2024-09-26 MED ORDER — TRIAMCINOLONE ACETONIDE 0.1 % EX CREA: 1.0000 | TOPICAL_CREAM | Freq: Two times a day (BID) | CUTANEOUS | 1 refills | Status: AC

## 2024-09-26 NOTE — Assessment & Plan Note (Signed)
 BP Readings from Last 3 Encounters:  09/26/24 128/72  03/22/24 124/70  09/23/23 (!) 158/96   Stable, pt to continue medical treatment hct 25 mg qd

## 2024-09-26 NOTE — Assessment & Plan Note (Signed)
 Lab Results  Component Value Date   LDLCALC 63 03/22/2024   Stable, pt to continue current statin crestor  20 mg every day, for f/u lab today

## 2024-09-26 NOTE — Progress Notes (Signed)
 Patient ID: [name] Bell, female   DOB: December 23, 1941, 83 y.o.   MRN: [id number]        Chief Complaint: follow up low vit d, hyperglycemia, hld, rash to right breast upper outer quadrant       HPI:  [name] Bell is a 83 y.o. female here overall doing ok, does have itchy red rash area to upper outer quadrant for 2 wks.  Has hx of breast ca, and has f/u mammogram very soon.  Also has bilateral wax impactions to ears.  Pt denies chest pain, increased sob or doe, wheezing, orthopnea, PND, increased LE swelling, palpitations, dizziness or syncope.   Pt denies polydipsia, polyuria, or new focal neuro s/s.   Still having botox  tx every 3 mo for torticollis.    Has mamomgram f/u about 2 wks.  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) 01/24/2008   ALLERGIC RHINITIS 01/24/2008   Allergy     ANEMIA-IRON DEFICIENCY 01/24/2008   pt denied   ANXIETY 02/26/2009   BACK PAIN 09/30/2010   Breast cancer (HCC)    Cancer of central portion of female breast (HCC) 05/04/2015   right breast/ no chemo or radiation done   DIZZINESS 02/28/2010   ELEVATED BLOOD PRESSURE WITHOUT DIAGNOSIS OF HYPERTENSION 02/26/2009   patient denies   FATIGUE 02/26/2009   GERD 01/24/2008   History of blood transfusion ectopic pregnancy   Neuromuscular disorder (HCC)    SHINGLES 11/06/2010   TOE PAIN 09/30/2010   VARICOSE VEINS, LOWER EXTREMITIES 08/16/2008   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   Past Surgical History:  Procedure Laterality Date   AUGMENTATION MAMMAPLASTY Bilateral 10/09/2016   BACK SURGERY  2010   fusion   BREAST LUMPECTOMY Right 05/14/2015   Procedure: RIGHT BREAST LUMPECTOMY;  Surgeon: Alm Angle, MD;   Location: [facility] OR;  Service: General;  Laterality: Right;   BREAST RECONSTRUCTION WITH PLACEMENT OF TISSUE EXPANDER AND FLEX HD (ACELLULAR HYDRATED DERMIS) Right 10/09/2016   Procedure: IMMEDIATE RIGHT BREAST RECONSTRUCTION WITH PLACEMENT OF SILICONE GEL IMPLANTS OR TISSUE EXPANDER AND ACELLULAR DERMAL MATRIX;  Surgeon: Alm Sick, MD;  Location: [facility] OR;  Service: Plastics;  Laterality: Right;   ECTOPIC PREGNANCY SURGERY     at 83 yo with salpingectomy   FOOT SURGERY Right    bunion, hammer toe.  x 2   FOOT SURGERY Right 05/2016   3 surgeries on right foot.   INCISION AND DRAINAGE OF WOUND Right 10/09/2016   Procedure: IRRIGATION AND DEBRIDEMENT BREAST HEMATOMA;  Surgeon: Alm Sick, MD;  Location: [facility] OR;  Service: Plastics;  Laterality: Right;   MASTECTOMY Right 10/09/2016   MASTECTOMY W/ SENTINEL NODE BIOPSY Right 10/09/2016   MASTECTOMY W/ SENTINEL NODE BIOPSY Right 10/09/2016   Procedure: RIGHT MASTECTOMY WITH SENTINEL LYMPH NODE BIOPSY;  Surgeon: Alm Angle, MD;  Location: [facility] OR;  Service: General;  Laterality: Right;   s/p bilateral breasts implants  12/2007   s/p lumbar surgury  july 2010   TONSILLECTOMY      reports that she has never smoked. She has never used smokeless tobacco. She reports current alcohol use  of about 6.0 standard drinks of alcohol per week. She reports that she does not use drugs. family history includes Asthma in her brother; Lung cancer in her father. Allergies  Allergen Reactions   Gentamicin  Other (See Comments)    Positive patch test  Positive patch test    Iron     Oxidized iron-makes hand and body break out when touched.   Other Other (See Comments)    Ethyl cyanoacrylate: Positive patch test  Methyl methacrylate: Positive patch test  Disperse dye mix: Positive patch test  Disperse blue 106/124: Positive patch test  Perfume mix: Positive patch test  Ethyl cyanoacrylate: Positive patch test  Methyl methacrylate: Positive patch test  Disperse dye  mix: Positive patch test  Disperse blue 106/124: Positive patch test  Perfume mix: Positive patch test    No Known Allergies    Aluminum Rash   Current Outpatient Medications on File Prior to Visit  Medication Sig Dispense Refill   acetaminophen  (TYLENOL ) 325 MG tablet Take 650 mg by mouth every 6 (six) hours as needed.     acetaminophen -codeine  (TYLENOL  #3) 300-30 MG tablet Take 1 tablet by mouth every 6 (six) hours as needed. 30 tablet 0   B Complex-Folic Acid (B COMPLEX VITAMINS, W/ FA,) CAPS Take 1 capsule by mouth daily.     Cholecalciferol (D3 VITAMIN PO)      clonazePAM  (KLONOPIN ) 1 MG tablet TAKE ONE (1) TABLET BY MOUTH TWO (2) TIMES DAILY 60 tablet 4   Coenzyme Q10 60 MG CAPS Take by mouth.     Cyanocobalamin  (VITAMIN B-12) 1000 MCG SUBL Place under the tongue. Vit b-12 1000 mg with Folate 400 mg-Take one daily     fluticasone  (FLONASE ) 50 MCG/ACT nasal spray USE 2 SPRAYS INTO BOTH NOSTRILS DAILY 16 g 5   glucosamine-chondroitin 500-400 MG tablet Take 1 tablet by mouth daily.     hydrochlorothiazide  (HYDRODIURIL ) 25 MG tablet TAKE ONE (1) TABLET BY MOUTH EVERY DAY 90 tablet 0   HYDROcodone -acetaminophen  (NORCO) 7.5-325 MG tablet      OnabotulinumtoxinA  (BOTOX  IJ)      OVER THE COUNTER MEDICATION Drinks beet juice daily.     OVER THE COUNTER MEDICATION Drinks vegetable juice daily.     pantoprazole  (PROTONIX ) 40 MG tablet Take 1 tablet (40 mg total) by mouth daily. 30 tablet 5   RETIN-A  0.05 % cream APPLY TO AFFECTED AREA(S) AT BEDTIME. 45 g 1   rosuvastatin  (CRESTOR ) 20 MG tablet Take 1 tablet (20 mg total) by mouth daily. 90 tablet 3   traZODone  (DESYREL ) 50 MG tablet TAKE 1 TABLET BY MOUTH AT BEDTIME 90 tablet 0   TURMERIC PO Take by mouth. Turmeric and Ginger -Take one pill tid     No current facility-administered medications on file prior to visit.        ROS:  All others reviewed and negative.  Objective        PE:  BP 128/72 (BP Location: Right Arm, Patient Position:  Sitting, Cuff Size: Normal)   Pulse 65   Temp 98.4 F (36.9 C) (Oral)   Ht 5' 4 (1.626 m)   Wt 164 lb (74.4 kg)   SpO2 99%   BMI 28.15 kg/m                 Constitutional: Pt appears in NAD               HENT: Head: NCAT.  Right Ear: External ear normal.                 Left Ear: External ear normal.                Eyes: . Pupils are equal, round, and reactive to light. Conjunctivae and EOM are normal               Nose: without d/c or deformity               Neck: Neck supple. Gross normal ROM               Cardiovascular: Normal rate and regular rhythm.                 Pulmonary/Chest: Effort normal and breath sounds without rales or wheezing.                Abd:  Soft, NT, ND, + BS, no organomegaly               Neurological: Pt is alert. At baseline orientation, motor grossly intact               Skin: Skin is warm. Slightly raised 1 cm area erythem rash non tender, no ulcers, no other new lesions, LE edema - none               Psychiatric: Pt behavior is normal without agitation   Micro: none  Cardiac tracings I have personally interpreted today:  none  Pertinent Radiological findings (summarize): none   Lab Results  Component Value Date   WBC 7.5 03/22/2024   HGB 13.4 03/22/2024   HCT 39.7 03/22/2024   PLT 287.0 03/22/2024   GLUCOSE 84 03/22/2024   CHOL 147 03/22/2024   TRIG 46.0 03/22/2024   HDL 74.20 03/22/2024   LDLDIRECT 128.5 07/04/2013   LDLCALC 63 03/22/2024   ALT 16 03/22/2024   AST 21 03/22/2024   NA 139 03/22/2024   K 4.1 03/22/2024   CL 102 03/22/2024   CREATININE 0.84 03/22/2024   BUN 20 03/22/2024   CO2 31 03/22/2024   TSH 1.31 03/22/2024   INR 0.9 07/17/2009   HGBA1C 5.5 03/22/2024   MICROALBUR <0.7 03/22/2024   Assessment/Plan:  Danaysia Thorlund Roundtree is a 83 y.o. White or Caucasian [1] female with  has a past medical history of Abnormal involuntary movements(781.0) (01/24/2008), ALLERGIC RHINITIS (01/24/2008), Allergy ,  ANEMIA-IRON DEFICIENCY (01/24/2008), ANXIETY (02/26/2009), BACK PAIN (09/30/2010), Breast cancer (HCC), Cancer of central portion of female breast (HCC) (05/04/2015), DIZZINESS (02/28/2010), ELEVATED BLOOD PRESSURE WITHOUT DIAGNOSIS OF HYPERTENSION (02/26/2009), FATIGUE (02/26/2009), GERD (01/24/2008), History of blood transfusion (ectopic pregnancy), Neuromuscular disorder (HCC), SHINGLES (11/06/2010), TOE PAIN (09/30/2010), VARICOSE VEINS, LOWER EXTREMITIES (08/16/2008), and Vertigo of central origin (02/26/2009).  Vitamin D  deficiency Last vitamin D  Lab Results  Component Value Date   VD25OH 88.80 03/22/2024   Stable, cont oral replacement   Impaired glucose tolerance Lab Results  Component Value Date   HGBA1C 5.5 03/22/2024   Stable, pt to continue current medical treatment  - diet,wt control   Hypertensive disorder BP Readings from Last 3 Encounters:  09/26/24 128/72  03/22/24 124/70  09/23/23 (!) 158/96   Stable, pt to continue medical treatment hct 25 mg qd   Hyperlipidemia Lab Results  Component Value Date   LDLCALC 63 03/22/2024   Stable, pt to continue current statin crestor  20 mg every day, for f/u lab today   Bilateral impacted cerumen Bilateral wax  impactions, resolved today (see note per staff)  Rash and nonspecific skin eruption Likely dermatitis - for triam cr prn, less likely to be inflammatory breast ca but has f/u mammogram next week  Followup: Return in about 6 months (around 03/26/2025).  Lynwood Rush, MD 09/26/2024 8:06 PM [facility]

## 2024-09-26 NOTE — Patient Instructions (Addendum)
 Your ears were cleared today  Please take all new medication as prescribed - the mild steroid cream  Please continue all other medications as before, and refills have been done if requested.  Please have the pharmacy call with any other refills you may need.  Please continue your efforts at being more active, low cholesterol diet, and weight control.  Please keep your appointments with your specialists as you may have planned - Neurology for botox  every 3 mo, and Mammogram soon  Please go to the LAB at the blood drawing area for the tests to be done  You will be contacted by phone if any changes need to be made immediately.  Otherwise, you will receive a letter about your results with an explanation, but please check with MyChart first.  Please make an Appointment to return in 6 months, or sooner if needed

## 2024-09-26 NOTE — Assessment & Plan Note (Signed)
 Lab Results  Component Value Date   HGBA1C 5.5 03/22/2024   Stable, pt to continue current medical treatment  - diet, wt control

## 2024-09-26 NOTE — Assessment & Plan Note (Signed)
 Likely dermatitis - for triam cr prn, less likely to be inflammatory breast ca but has f/u mammogram next week

## 2024-09-26 NOTE — Assessment & Plan Note (Signed)
 Bilateral wax impactions, resolved today (see note per staff)

## 2024-09-26 NOTE — Assessment & Plan Note (Signed)
 Last vitamin D Lab Results  Component Value Date   VD25OH 88.80 03/22/2024   Stable, cont oral replacement

## 2024-09-27 ENCOUNTER — Ambulatory Visit: Payer: Self-pay | Admitting: Internal Medicine

## 2024-09-27 LAB — TSH: TSH: 1.04 u[IU]/mL (ref 0.35–5.50)

## 2024-09-27 NOTE — Progress Notes (Signed)
 The test results show that your current treatment is OK, as the tests are stable.  Please continue the same plan.  There is no other need for change of treatment or further evaluation based on these results, at this time.  thanks

## 2024-09-29 ENCOUNTER — Ambulatory Visit (INDEPENDENT_AMBULATORY_CARE_PROVIDER_SITE_OTHER): Admitting: Neurology

## 2024-09-29 ENCOUNTER — Telehealth: Payer: Self-pay

## 2024-09-29 DIAGNOSIS — G243 Spasmodic torticollis: Secondary | ICD-10-CM

## 2024-09-29 MED ORDER — INCOBOTULINUMTOXINA 100 UNITS IM SOLR
400.0000 [IU] | INTRAMUSCULAR | Status: AC
  Administered 2024-09-29: 400 [IU] via INTRAMUSCULAR

## 2024-09-29 NOTE — Telephone Encounter (Signed)
 Copied from CRM [id number]. Topic: Clinical - Medication Question >> Sep 29, 2024  2:59 PM Alfonso HERO wrote: Reason for CRM: Patient calling because the pharmacy states they dont have her request for the acetaminophen -codeine  (TYLENOL  #3) 300-30 MG tablet.  [address] [place], [place] - [address] [place] [address] [address] [address] [place] [address] Phone: [phone] Fax: [phone]  Can you please follow up with the patient of resent the Rx to the above pharmacy. They close at 1pm on Friday.

## 2024-09-29 NOTE — Procedures (Addendum)
 [facility]   Procedure Note Botox   Attending: Dr. Asberry Partick Musselman  Preoperative Diagnosis(es): Cervical Dystonia  Prior toxin: Onobotulinum toxin Current toxin: Incobotulinumtoxin A  Result History  Notes it wears off about 10 days prior.  She does state that she generally does not have trouble moving her head to the left as long as the Botox  is working well.  Consent obtained from: The patient  Benefits discussed included, but were not limited to decreased muscle tightness, increased joint range of motion, and decreased pain.  Risk discussed included, but were not limited pain and discomfort, bleeding, bruising, excessive weakness, venous thrombosis, muscle atrophy and dysphagia.  A copy of the patient medication guide was given to the patient which explains the blackbox warning.  Informed consent was obtained  Patients identity and treatment sites confirmed yes.     Details of Procedure: Skin was cleaned with alcohol.  A 30 gauge, 1/2 inch needle was introduced to the target muscle (except splenius capitus, posterior approach, where 27 inch, 1 1/2 gauge needle was used).  Prior to injection, the needle plunger was aspirated to make sure the needle was not within a blood vessel.  There was no blood retrieved on aspiration.    Following is a summary of the muscles injected  And the amount of incobotulinumtoxin A used:   Dilution 0.9% preservative free saline mixed with 100 u incobotulinumtoxin A to make 10 U per 0.1cc  Injections  Location Left  Right Units Number of sites        Sternocleidomastoid 60+40 0 100 1  Splenius Capitus, posterior approach 0 100 100 1  Splenius Capitus, lateral approach 0 60 60 1  Levator Scapulae      Trapezius 10/18/19/20  70         TOTAL UNITS:   330    Agent: Botulinum Type A ( Onobotulinum Toxin type A ). 4 vials of incobotulinumtoxin A were used, containing 100 units and freshly diluted with 1 mL of sterile, non-preserved saline     Total injected (Units): 330  Total wasted (Units): 70  Clinical comment: Patient does take clonazepam  in addition to trazodone  because the Botox  does not completely help with her symptoms.  PDMP is reviewed and I am the only prescriber of her clonazepam .  She does utilize the trazodone  for sleep because of pain associated with the dystonia at night.   Patient tolerated procedure well, without side effects

## 2024-09-30 ENCOUNTER — Other Ambulatory Visit: Payer: Self-pay | Admitting: Internal Medicine

## 2024-09-30 ENCOUNTER — Other Ambulatory Visit: Payer: Self-pay | Admitting: Family Medicine

## 2024-10-03 DIAGNOSIS — M19071 Primary osteoarthritis, right ankle and foot: Secondary | ICD-10-CM | POA: Diagnosis not present

## 2024-10-03 DIAGNOSIS — I70203 Unspecified atherosclerosis of native arteries of extremities, bilateral legs: Secondary | ICD-10-CM | POA: Diagnosis not present

## 2024-10-03 DIAGNOSIS — M792 Neuralgia and neuritis, unspecified: Secondary | ICD-10-CM | POA: Diagnosis not present

## 2024-10-03 DIAGNOSIS — M19072 Primary osteoarthritis, left ankle and foot: Secondary | ICD-10-CM | POA: Diagnosis not present

## 2024-10-03 DIAGNOSIS — G5761 Lesion of plantar nerve, right lower limb: Secondary | ICD-10-CM | POA: Diagnosis not present

## 2024-10-12 ENCOUNTER — Ambulatory Visit
Admission: RE | Admit: 2024-10-12 | Discharge: 2024-10-12 | Disposition: A | Discharge status: Home / Self Care | Source: Ambulatory Visit | Attending: Internal Medicine | Admitting: Internal Medicine

## 2024-10-12 ENCOUNTER — Other Ambulatory Visit: Payer: Self-pay | Admitting: Internal Medicine

## 2024-10-12 DIAGNOSIS — Z1231 Encounter for screening mammogram for malignant neoplasm of breast: Secondary | ICD-10-CM

## 2024-10-17 ENCOUNTER — Other Ambulatory Visit: Payer: Self-pay | Admitting: Neurology

## 2024-10-17 DIAGNOSIS — M65871 Other synovitis and tenosynovitis, right ankle and foot: Secondary | ICD-10-CM | POA: Diagnosis not present

## 2024-10-17 DIAGNOSIS — G5761 Lesion of plantar nerve, right lower limb: Secondary | ICD-10-CM | POA: Diagnosis not present

## 2024-10-17 DIAGNOSIS — I70203 Unspecified atherosclerosis of native arteries of extremities, bilateral legs: Secondary | ICD-10-CM | POA: Diagnosis not present

## 2024-10-17 DIAGNOSIS — M19072 Primary osteoarthritis, left ankle and foot: Secondary | ICD-10-CM | POA: Diagnosis not present

## 2024-10-17 DIAGNOSIS — M19071 Primary osteoarthritis, right ankle and foot: Secondary | ICD-10-CM | POA: Diagnosis not present

## 2024-10-17 DIAGNOSIS — M792 Neuralgia and neuritis, unspecified: Secondary | ICD-10-CM | POA: Diagnosis not present

## 2024-10-18 ENCOUNTER — Other Ambulatory Visit: Payer: Self-pay | Admitting: Neurology

## 2024-10-18 DIAGNOSIS — G243 Spasmodic torticollis: Secondary | ICD-10-CM

## 2024-11-16 DIAGNOSIS — H04123 Dry eye syndrome of bilateral lacrimal glands: Secondary | ICD-10-CM | POA: Diagnosis not present

## 2024-11-17 DIAGNOSIS — L570 Actinic keratosis: Secondary | ICD-10-CM | POA: Diagnosis not present

## 2024-11-17 DIAGNOSIS — D692 Other nonthrombocytopenic purpura: Secondary | ICD-10-CM | POA: Diagnosis not present

## 2024-11-17 DIAGNOSIS — L821 Other seborrheic keratosis: Secondary | ICD-10-CM | POA: Diagnosis not present

## 2024-11-17 DIAGNOSIS — L814 Other melanin hyperpigmentation: Secondary | ICD-10-CM | POA: Diagnosis not present

## 2024-11-20 ENCOUNTER — Other Ambulatory Visit: Payer: Self-pay | Admitting: Internal Medicine

## 2024-11-21 ENCOUNTER — Other Ambulatory Visit: Payer: Self-pay

## 2024-11-28 NOTE — Telephone Encounter (Unsigned)
 Copied from CRM #[id number]. Topic: General - Other >> Nov 28, 2024  9:40 AM Charlet HERO wrote: Reason for CRM: Patient is calling to get more information about her Dr leaving and she would like to know about her options for a new provided in the office.

## 2024-11-29 DIAGNOSIS — M19072 Primary osteoarthritis, left ankle and foot: Secondary | ICD-10-CM | POA: Diagnosis not present

## 2024-11-29 DIAGNOSIS — G5761 Lesion of plantar nerve, right lower limb: Secondary | ICD-10-CM | POA: Diagnosis not present

## 2024-11-29 DIAGNOSIS — M792 Neuralgia and neuritis, unspecified: Secondary | ICD-10-CM | POA: Diagnosis not present

## 2024-11-29 DIAGNOSIS — M19071 Primary osteoarthritis, right ankle and foot: Secondary | ICD-10-CM | POA: Diagnosis not present

## 2024-11-29 DIAGNOSIS — M65871 Other synovitis and tenosynovitis, right ankle and foot: Secondary | ICD-10-CM | POA: Diagnosis not present

## 2024-11-29 DIAGNOSIS — I70203 Unspecified atherosclerosis of native arteries of extremities, bilateral legs: Secondary | ICD-10-CM | POA: Diagnosis not present

## 2024-12-09 ENCOUNTER — Other Ambulatory Visit: Payer: Self-pay | Admitting: Internal Medicine

## 2024-12-09 MED ORDER — ACETAMINOPHEN-CODEINE 300-30 MG PO TABS: 1.0000 | ORAL_TABLET | Freq: Four times a day (QID) | ORAL | 0 refills | Status: AC | PRN

## 2025-01-13 ENCOUNTER — Ambulatory Visit (INDEPENDENT_AMBULATORY_CARE_PROVIDER_SITE_OTHER): Admitting: Neurology

## 2025-01-13 DIAGNOSIS — G243 Spasmodic torticollis: Secondary | ICD-10-CM | POA: Diagnosis not present

## 2025-01-13 MED ORDER — INCOBOTULINUMTOXINA 100 UNITS IM SOLR
400.0000 [IU] | INTRAMUSCULAR | Status: AC
  Administered 2025-01-13: 330 [IU] via INTRAMUSCULAR

## 2025-01-13 NOTE — Procedures (Signed)
 [facility]   Procedure Note Botox   Attending: Dr. Asberry Casara Perrier  Preoperative Diagnosis(es): Cervical Dystonia  Prior toxin: Onobotulinum toxin Current toxin: Incobotulinumtoxin A  Result History  Notes may have done worse with this than Botox  (ono-) in terms of longer to kick in and some headaches but wants to trial again.  Consent obtained from: The patient  Benefits discussed included, but were not limited to decreased muscle tightness, increased joint range of motion, and decreased pain.  Risk discussed included, but were not limited pain and discomfort, bleeding, bruising, excessive weakness, venous thrombosis, muscle atrophy and dysphagia.  A copy of the patient medication guide was given to the patient which explains the blackbox warning.  Informed consent was obtained  Patients identity and treatment sites confirmed yes.     Details of Procedure: Skin was cleaned with alcohol.  A 30 gauge, 1/2 inch needle was introduced to the target muscle (except splenius capitus, posterior approach, where 27 inch, 1 1/2 gauge needle was used).  Prior to injection, the needle plunger was aspirated to make sure the needle was not within a blood vessel.  There was no blood retrieved on aspiration.    Following is a summary of the muscles injected  And the amount of incobotulinumtoxin A used:   Dilution 0.9% preservative free saline mixed with 100 u incobotulinumtoxin A to make 10 U per 0.1cc  Injections  Location Left  Right Units Number of sites        Sternocleidomastoid 60+40 0 100 1  Splenius Capitus, posterior approach 0 100 100 1  Splenius Capitus, lateral approach 0 60 60 1  Levator Scapulae      Trapezius 10/18/19/20  70         TOTAL UNITS:   330    Agent: Botulinum Type A ( Onobotulinum Toxin type A ). 4 vials of incobotulinumtoxin A were used, containing 100 units and freshly diluted with 1 mL of sterile, non-preserved saline    Total injected (Units): 330  Total  wasted (Units): 70  Clinical comment: Patient continues to take klonopin  and trazodone  for additional pain/dystonia s/s.   Patient tolerated procedure well, without side effects

## 2025-03-13 ENCOUNTER — Ambulatory Visit

## 2025-03-27 ENCOUNTER — Ambulatory Visit: Admitting: Internal Medicine

## 2025-04-14 ENCOUNTER — Ambulatory Visit: Payer: Self-pay | Admitting: Neurology

## 2025-04-24 ENCOUNTER — Encounter: Admitting: Family Medicine
